# Patient Record
Sex: Female | Born: 1935 | Race: Black or African American | Hispanic: No | Smoking: Former smoker
Health system: Southern US, Community
[De-identification: ages and names within clinical notes are randomized; demographics above are authoritative.]

## PROBLEM LIST (undated history)

## (undated) DIAGNOSIS — E785 Hyperlipidemia, unspecified: Secondary | ICD-10-CM

## (undated) DIAGNOSIS — I82409 Acute embolism and thrombosis of unspecified deep veins of unspecified lower extremity: Secondary | ICD-10-CM

## (undated) DIAGNOSIS — I82890 Acute embolism and thrombosis of other specified veins: Secondary | ICD-10-CM

## (undated) DIAGNOSIS — I2510 Atherosclerotic heart disease of native coronary artery without angina pectoris: Secondary | ICD-10-CM

## (undated) DIAGNOSIS — K7689 Other specified diseases of liver: Secondary | ICD-10-CM

## (undated) DIAGNOSIS — E039 Hypothyroidism, unspecified: Secondary | ICD-10-CM

## (undated) DIAGNOSIS — I5032 Chronic diastolic (congestive) heart failure: Secondary | ICD-10-CM

## (undated) DIAGNOSIS — I8290 Acute embolism and thrombosis of unspecified vein: Secondary | ICD-10-CM

## (undated) DIAGNOSIS — I10 Essential (primary) hypertension: Secondary | ICD-10-CM

## (undated) DIAGNOSIS — I739 Peripheral vascular disease, unspecified: Secondary | ICD-10-CM

## (undated) HISTORY — DX: Atherosclerotic heart disease of native coronary artery without angina pectoris: I25.10

## (undated) HISTORY — DX: Hypothyroidism, unspecified: E03.9

## (undated) HISTORY — PX: THYROIDECTOMY: SHX17

## (undated) HISTORY — DX: Hyperlipidemia, unspecified: E78.5

## (undated) HISTORY — DX: Chronic diastolic (congestive) heart failure: I50.32

## (undated) HISTORY — DX: Acute embolism and thrombosis of unspecified vein: I82.90

## (undated) HISTORY — PX: CARDIAC CATHETERIZATION: SHX172

## (undated) HISTORY — DX: Other specified diseases of liver: K76.89

## (undated) HISTORY — PX: ANGIOPLASTY: SHX39

## (undated) HISTORY — DX: Acute embolism and thrombosis of unspecified deep veins of unspecified lower extremity: I82.409

## (undated) HISTORY — DX: Peripheral vascular disease, unspecified: I73.9

## (undated) HISTORY — DX: Acute embolism and thrombosis of other specified veins: I82.890

## (undated) HISTORY — DX: Essential (primary) hypertension: I10

---

## 1997-11-10 HISTORY — PX: CORONARY ARTERY BYPASS GRAFT: SHX141

## 2008-01-08 ENCOUNTER — Emergency Department: Payer: Self-pay | Admitting: Emergency Medicine

## 2009-07-03 ENCOUNTER — Ambulatory Visit: Payer: Self-pay | Admitting: Vascular Surgery

## 2010-07-27 ENCOUNTER — Inpatient Hospital Stay: Payer: Self-pay | Admitting: Internal Medicine

## 2011-02-27 ENCOUNTER — Emergency Department: Payer: Self-pay

## 2011-02-27 ENCOUNTER — Encounter: Payer: Self-pay | Admitting: Cardiovascular Disease

## 2011-03-03 ENCOUNTER — Encounter: Payer: Self-pay | Admitting: Cardiovascular Disease

## 2011-03-07 ENCOUNTER — Ambulatory Visit: Payer: Self-pay | Admitting: Cardiovascular Disease

## 2011-03-12 ENCOUNTER — Encounter: Payer: Self-pay | Admitting: Cardiovascular Disease

## 2011-03-12 ENCOUNTER — Ambulatory Visit (INDEPENDENT_AMBULATORY_CARE_PROVIDER_SITE_OTHER): Payer: No Typology Code available for payment source | Admitting: Cardiovascular Disease

## 2011-03-12 DIAGNOSIS — R609 Edema, unspecified: Secondary | ICD-10-CM | POA: Insufficient documentation

## 2011-03-12 DIAGNOSIS — I739 Peripheral vascular disease, unspecified: Secondary | ICD-10-CM | POA: Insufficient documentation

## 2011-03-12 DIAGNOSIS — E785 Hyperlipidemia, unspecified: Secondary | ICD-10-CM | POA: Insufficient documentation

## 2011-03-12 DIAGNOSIS — R0602 Shortness of breath: Secondary | ICD-10-CM | POA: Insufficient documentation

## 2011-03-12 DIAGNOSIS — E119 Type 2 diabetes mellitus without complications: Secondary | ICD-10-CM | POA: Insufficient documentation

## 2011-03-12 DIAGNOSIS — Z951 Presence of aortocoronary bypass graft: Secondary | ICD-10-CM

## 2011-03-12 NOTE — Assessment & Plan Note (Signed)
Edema appears to be chronic on the right, possibly from postoperative changes. She does have trace to 1+ edema on the left ankle

## 2011-03-12 NOTE — Patient Instructions (Signed)
Your physician has requested that you have an echocardiogram. Echocardiography is a painless test that uses sound waves to create images of your heart. It provides your doctor with information about the size and shape of your heart and how well your heart's chambers and valves are working. This procedure takes approximately one hour. There are no restrictions for this procedure.  Your physician has requested that you have a lower extremity arterial exercise duplex. During this test, exercise and ultrasound are used to evaluate arterial blood flow in the legs. Allow one hour for this exam. There are no restrictions or special instructions.  Your physician recommends that you schedule a follow-up appointment in: 3 months

## 2011-03-12 NOTE — Assessment & Plan Note (Signed)
We'll try to obtain her most recent lipid panel prior records. Goal LDL is less than 70.

## 2011-03-12 NOTE — Progress Notes (Signed)
Patient ID: [name] [name], female    DOB: 01-10-1925, 77 y.o.   MRN: [id number]  HPI Comments: [name] [name] is a very pleasant 77 year old woman with history of coronary artery disease, bypass surgery in 1999 in [place], long history of smoking for at least 35 years who stopped over a decade ago, severe peripheral vascular disease with stenting placed to her bilateral iliac vessels in August 2010, hyperlipidemia, diabetes, hypertension with episode of gout in September 2011, recent evaluation in the emergency room for left hand numbness who was referred here for further evaluation.  She reports that the emergency room told her that her hand numbness was thought secondary to a heart attack. She does have chronic shortness of breath though her granddaughter reports this has been getting worse recently. She is on a diuretic. She does have chronic lower extremity edema which is worse on the right than the left. Pain donor site was on the right as well as radial graft on the right forearm. She does have occasional episodes of chest pain though her biggest complaint is shortness of breath and swelling. She is unable to wear TED hose.   She does have some weakness in her legs and does not walk very far. Her breathing stops her from walking any significant distance. She reports that her sugars have been well-controlled.  EKG shows normal sinus rhythm with rate 78 beats per minute with no significant ST or T wave changes     Review of Systems  HENT: Negative.   Eyes: Negative.   Respiratory: Positive for shortness of breath.   Cardiovascular: Positive for chest pain and leg swelling.  Gastrointestinal: Negative.   Musculoskeletal: Positive for gait problem.  Skin: Negative.   Neurological: Positive for weakness.  Hematological: Negative.   Psychiatric/Behavioral: Negative.   All other systems reviewed and are negative.   BP 130/88  Pulse 78  Ht 5\' 4"  (1.626 m)  Wt 200 lb (90.719 kg)  BMI  34.33 kg/m2   Physical Exam  Nursing note and vitals reviewed. Constitutional: She is oriented to person, place, and time. She appears well-developed and well-nourished.       Obese.  HENT:  Head: Normocephalic.  Nose: Nose normal.  Mouth/Throat: Oropharynx is clear and moist.  Eyes: Conjunctivae are normal. Pupils are equal, round, and reactive to light.  Neck: Normal range of motion. Neck supple. No JVD present.  Cardiovascular: Normal rate, regular rhythm, S1 normal, S2 normal and intact distal pulses.  Exam reveals no gallop and no friction rub.   Murmur heard.  Crescendo systolic murmur is present with a grade of 2/6       1+ pitting edema with signs of chronic venous stasis on the right to below the knee, trace edema around the left ankle.  Pulmonary/Chest: Effort normal and breath sounds normal. No respiratory distress. She has no wheezes. She has no rales. She exhibits no tenderness.  Abdominal: Soft. Bowel sounds are normal. She exhibits no distension. There is no tenderness.  Musculoskeletal: Normal range of motion. She exhibits edema. She exhibits no tenderness.  Lymphadenopathy:    She has no cervical adenopathy.  Neurological: She is alert and oriented to person, place, and time. Coordination normal.  Skin: Skin is warm and dry. No rash noted. No erythema.  Psychiatric: She has a normal mood and affect. Her behavior is normal. Judgment and thought content normal.         Assessment and Plan

## 2011-03-12 NOTE — Assessment & Plan Note (Signed)
Currently with no symptoms of angina. No further workup at this time. Continue current medication regimen. 

## 2011-03-12 NOTE — Assessment & Plan Note (Signed)
Her biggest complaint is shortness of breath. Uncertain if this is from fluid overload, pulmonary hypertension, worsening ischemia or deconditioned state. We have ordered an echocardiogram to help distinguish what her etiology is.

## 2011-03-12 NOTE — Assessment & Plan Note (Signed)
She does have some leg weakness, leg swelling with poor pulses in the lower extremities. We have ordered routine ABIs for the lower extremities given her history of stenting and severe peripheral vascular disease in 2010.

## 2011-03-12 NOTE — Assessment & Plan Note (Signed)
She reports adequate diabetes numbers. We'll try to obtain the lab work for our review. Creatinine from the hospital is 1.6. This needed to be monitored closely.

## 2011-03-25 ENCOUNTER — Ambulatory Visit: Payer: Self-pay | Admitting: Internal Medicine

## 2011-03-27 ENCOUNTER — Other Ambulatory Visit (INDEPENDENT_AMBULATORY_CARE_PROVIDER_SITE_OTHER): Payer: No Typology Code available for payment source | Admitting: *Deleted

## 2011-03-27 ENCOUNTER — Other Ambulatory Visit: Payer: Self-pay | Admitting: *Deleted

## 2011-03-27 ENCOUNTER — Encounter (INDEPENDENT_AMBULATORY_CARE_PROVIDER_SITE_OTHER): Payer: No Typology Code available for payment source | Admitting: *Deleted

## 2011-03-27 ENCOUNTER — Other Ambulatory Visit: Payer: Self-pay | Admitting: Cardiovascular Disease

## 2011-03-27 DIAGNOSIS — I739 Peripheral vascular disease, unspecified: Secondary | ICD-10-CM

## 2011-03-27 DIAGNOSIS — I2510 Atherosclerotic heart disease of native coronary artery without angina pectoris: Secondary | ICD-10-CM

## 2011-03-27 DIAGNOSIS — E785 Hyperlipidemia, unspecified: Secondary | ICD-10-CM

## 2011-03-27 DIAGNOSIS — Z951 Presence of aortocoronary bypass graft: Secondary | ICD-10-CM

## 2011-03-27 DIAGNOSIS — Z79899 Other long term (current) drug therapy: Secondary | ICD-10-CM

## 2011-03-27 DIAGNOSIS — R0602 Shortness of breath: Secondary | ICD-10-CM

## 2011-03-27 DIAGNOSIS — R609 Edema, unspecified: Secondary | ICD-10-CM

## 2011-03-28 LAB — HEPATIC FUNCTION PANEL
ALT: 52 U/L — ABNORMAL HIGH (ref 0–35)
AST: 46 U/L — ABNORMAL HIGH (ref 0–37)
Bilirubin, Direct: 0.1 mg/dL (ref 0.0–0.3)
Indirect Bilirubin: 0.6 mg/dL (ref 0.0–0.9)
Total Bilirubin: 0.7 mg/dL (ref 0.3–1.2)

## 2011-03-28 LAB — LIPID PANEL
Cholesterol: 165 mg/dL (ref 0–200)
VLDL: 40 mg/dL (ref 0–40)

## 2011-03-31 ENCOUNTER — Encounter: Payer: Self-pay | Admitting: Cardiovascular Disease

## 2011-04-08 ENCOUNTER — Encounter: Payer: Self-pay | Admitting: Cardiovascular Disease

## 2011-04-10 ENCOUNTER — Telehealth: Payer: Self-pay | Admitting: *Deleted

## 2011-04-10 ENCOUNTER — Other Ambulatory Visit: Payer: No Typology Code available for payment source | Admitting: *Deleted

## 2011-04-10 NOTE — Telephone Encounter (Signed)
Call pt to notify of results of echo and PVS  The echocardiograms shows: - Left ventricle: The cavity size was normal.  Systolic function was normal. The estimated ejection fraction was in the range of 55% to 65%. Wall motion was normal; there were no regional wall motion abnormalities. Evidence of diastolic dysfunction.  - Left atrium: The atrium was mildly dilated. - Right ventricle: Systolic function was normal. - Tricuspid valve: Mild to moderate regurgitation. - Pulmonary arteries: Systolic pressure was elevated consistent with mild to moderate pulmonary HTN. RVSP estimated at least 45 mm Hg.   The ultrasound of the legs suggest that the stents are getting blocked. We would like for you to meet with one of the interventional doctors to see if the stents need to be reopened. Please call the office so we can help set this up for you.

## 2011-04-14 NOTE — Telephone Encounter (Signed)
Spoke to pt, notified of msg below, have referred her to Dr. Wyn Quaker and Reino Kent, spoke to receptionist and was told they had an opening today at 3pm. Spoke with pt's daughter Loel Lofty ( who takes pt to appts) and she is going to check with her work to see if she can make appt today, if not, will reschedule. Notes faxed to Dr. Wyn Quaker and Reino Kent.

## 2011-04-14 NOTE — Telephone Encounter (Signed)
Pt's daughter confirmed appt, notified pt.

## 2011-04-16 ENCOUNTER — Telehealth: Payer: Self-pay

## 2011-04-16 MED ORDER — EZETIMIBE 10 MG PO TABS: 10.0000 mg | ORAL_TABLET | Freq: Every day | ORAL | Status: DC

## 2011-04-16 NOTE — Telephone Encounter (Signed)
The daughter states patient would like to add the zetia 10 mg to the lovastatin.  Rx sent to [facility] on [address].

## 2011-04-16 NOTE — Telephone Encounter (Signed)
Spoke with the daughter regarding the cholesterol letter about the change in medication or just add zetia to the lovastatin.  Waiting to hear what the patient would like to do? Send to [facility] on [address].

## 2011-04-18 ENCOUNTER — Telehealth: Payer: Self-pay | Admitting: Cardiovascular Disease

## 2011-04-18 NOTE — Telephone Encounter (Signed)
Dr. Mariah Milling, can we send clearance for colonoscopy? Pt seen in office 03/12/2011 for f/u from [facility]. Please advise.

## 2011-04-18 NOTE — Telephone Encounter (Signed)
Please advise 

## 2011-04-18 NOTE — Telephone Encounter (Signed)
Needs a written clearance for a colonoscopy for Dr Larose Kells.  Fax #[phone]

## 2011-04-21 ENCOUNTER — Encounter: Payer: Self-pay | Admitting: Cardiovascular Disease

## 2011-04-23 NOTE — Telephone Encounter (Signed)
OK to have colonoscopy. Make sure they do not give IV fluids

## 2011-04-24 ENCOUNTER — Encounter: Payer: Self-pay | Admitting: *Deleted

## 2011-04-24 NOTE — Telephone Encounter (Signed)
Note sent to Dr. Niel Hummer for clearance.

## 2011-05-16 ENCOUNTER — Telehealth: Payer: Self-pay | Admitting: *Deleted

## 2011-05-16 NOTE — Telephone Encounter (Signed)
Pt calling regarding side effects of Zetia. Pt was started on Zetia 10mg  05/10/11 and states she has had GI upset since. She has not started any other new meds. Pt will hold for 1 week and call me back to let me know if her symptoms improved. Pt is still taking Lovastatin.

## 2011-05-19 NOTE — Telephone Encounter (Signed)
After Gi better, try 1/2 dose zetia first for a few weeks

## 2011-05-20 NOTE — Telephone Encounter (Signed)
Pt notified, she states GI has improved, she will try early next week at 1/2 tablet of Zetia and will call to notify how she is tolerating.

## 2011-06-12 ENCOUNTER — Ambulatory Visit: Payer: No Typology Code available for payment source | Admitting: Cardiovascular Disease

## 2011-07-15 ENCOUNTER — Ambulatory Visit: Payer: No Typology Code available for payment source | Admitting: Cardiovascular Disease

## 2011-11-26 ENCOUNTER — Telehealth: Payer: Self-pay

## 2011-11-26 MED ORDER — FUROSEMIDE 40 MG PO TABS: 40.0000 mg | ORAL_TABLET | Freq: Every day | ORAL | Status: DC

## 2011-11-26 MED ORDER — DILTIAZEM HCL 120 MG PO TABS: 120.0000 mg | ORAL_TABLET | Freq: Every day | ORAL | Status: DC

## 2011-11-26 MED ORDER — POTASSIUM CHLORIDE CRYS ER 10 MEQ PO TBCR: 10.0000 meq | EXTENDED_RELEASE_TABLET | Freq: Every day | ORAL | Status: DC

## 2011-11-26 NOTE — Telephone Encounter (Signed)
Refill sent for furosemide 40 mg and diltiazem 120 mg. The patient has a follow up scheduled for Feb. 15, 2013 with Dr. Mariah Milling.

## 2011-11-26 NOTE — Telephone Encounter (Signed)
Refill sent for klor con 10 meq

## 2011-11-30 ENCOUNTER — Inpatient Hospital Stay: Payer: Self-pay | Admitting: Internal Medicine

## 2011-11-30 DIAGNOSIS — I959 Hypotension, unspecified: Secondary | ICD-10-CM

## 2011-11-30 DIAGNOSIS — I498 Other specified cardiac arrhythmias: Secondary | ICD-10-CM

## 2011-11-30 LAB — CK TOTAL AND CKMB (NOT AT ARMC)
CK, Total: 281 U/L — ABNORMAL HIGH (ref 21–215)
CK, Total: 297 U/L — ABNORMAL HIGH (ref 21–215)
CK-MB: 1.3 ng/mL (ref 0.5–3.6)
CK-MB: 1.5 ng/mL (ref 0.5–3.6)

## 2011-11-30 LAB — CBC
MCHC: 33.5 g/dL (ref 32.0–36.0)
MCV: 97 fL (ref 80–100)
Platelet: 203 10*3/uL (ref 150–440)
RBC: 3.88 10*6/uL (ref 3.80–5.20)
RDW: 13.7 % (ref 11.5–14.5)
WBC: 11.1 10*3/uL — ABNORMAL HIGH (ref 3.6–11.0)

## 2011-11-30 LAB — COMPREHENSIVE METABOLIC PANEL
Albumin: 2.9 g/dL — ABNORMAL LOW (ref 3.4–5.0)
Anion Gap: 12 (ref 7–16)
Calcium, Total: 8.3 mg/dL — ABNORMAL LOW (ref 8.5–10.1)
Co2: 25 mmol/L (ref 21–32)
Creatinine: 1.57 mg/dL — ABNORMAL HIGH (ref 0.60–1.30)
Glucose: 154 mg/dL — ABNORMAL HIGH (ref 65–99)
SGPT (ALT): 144 U/L — ABNORMAL HIGH
Sodium: 147 mmol/L — ABNORMAL HIGH (ref 136–145)
Total Protein: 6 g/dL — ABNORMAL LOW (ref 6.4–8.2)

## 2011-11-30 LAB — TSH: Thyroid Stimulating Horm: 6.74 u[IU]/mL — ABNORMAL HIGH

## 2011-12-01 DIAGNOSIS — I369 Nonrheumatic tricuspid valve disorder, unspecified: Secondary | ICD-10-CM

## 2011-12-01 LAB — LIPID PANEL
Cholesterol: 81 mg/dL (ref 0–200)
HDL Cholesterol: 38 mg/dL — ABNORMAL LOW (ref 40–60)

## 2011-12-01 LAB — COMPREHENSIVE METABOLIC PANEL
Anion Gap: 10 (ref 7–16)
BUN: 21 mg/dL — ABNORMAL HIGH (ref 7–18)
Bilirubin,Total: 0.7 mg/dL (ref 0.2–1.0)
Chloride: 108 mmol/L — ABNORMAL HIGH (ref 98–107)
Co2: 29 mmol/L (ref 21–32)
Creatinine: 1.33 mg/dL — ABNORMAL HIGH (ref 0.60–1.30)
EGFR (African American): 49 — ABNORMAL LOW
EGFR (Non-African Amer.): 40 — ABNORMAL LOW
Osmolality: 294 (ref 275–301)
Potassium: 4.1 mmol/L (ref 3.5–5.1)
SGPT (ALT): 120 U/L — ABNORMAL HIGH
Sodium: 147 mmol/L — ABNORMAL HIGH (ref 136–145)
Total Protein: 6.2 g/dL — ABNORMAL LOW (ref 6.4–8.2)

## 2011-12-01 LAB — CK TOTAL AND CKMB (NOT AT ARMC)
CK, Total: 309 U/L — ABNORMAL HIGH (ref 21–215)
CK-MB: 1.2 ng/mL (ref 0.5–3.6)

## 2011-12-01 LAB — MAGNESIUM: Magnesium: 1.7 mg/dL — ABNORMAL LOW

## 2011-12-01 LAB — CBC WITH DIFFERENTIAL/PLATELET
Basophil #: 0 10*3/uL (ref 0.0–0.1)
Basophil %: 0.4 %
Eosinophil #: 0.2 10*3/uL (ref 0.0–0.7)
Eosinophil %: 1.8 %
HGB: 11.9 g/dL — ABNORMAL LOW (ref 12.0–16.0)
Lymphocyte #: 2.6 10*3/uL (ref 1.0–3.6)
Lymphocyte %: 29.3 %
MCH: 32.5 pg (ref 26.0–34.0)
MCHC: 33.7 g/dL (ref 32.0–36.0)
Monocyte %: 8.2 %
Neutrophil #: 5.3 10*3/uL (ref 1.4–6.5)
Neutrophil %: 60.3 %
Platelet: 173 10*3/uL (ref 150–440)
RDW: 13.4 % (ref 11.5–14.5)

## 2011-12-01 LAB — HEMOGLOBIN A1C: Hemoglobin A1C: 7.4 % — ABNORMAL HIGH (ref 4.2–6.3)

## 2011-12-11 ENCOUNTER — Encounter: Payer: Self-pay | Admitting: Cardiovascular Disease

## 2011-12-11 ENCOUNTER — Ambulatory Visit (INDEPENDENT_AMBULATORY_CARE_PROVIDER_SITE_OTHER): Payer: Medicare Other | Admitting: Cardiovascular Disease

## 2011-12-11 DIAGNOSIS — Z951 Presence of aortocoronary bypass graft: Secondary | ICD-10-CM

## 2011-12-11 DIAGNOSIS — R0602 Shortness of breath: Secondary | ICD-10-CM

## 2011-12-11 DIAGNOSIS — R609 Edema, unspecified: Secondary | ICD-10-CM

## 2011-12-11 DIAGNOSIS — R000 Tachycardia, unspecified: Secondary | ICD-10-CM

## 2011-12-11 DIAGNOSIS — I739 Peripheral vascular disease, unspecified: Secondary | ICD-10-CM

## 2011-12-11 DIAGNOSIS — E785 Hyperlipidemia, unspecified: Secondary | ICD-10-CM

## 2011-12-11 DIAGNOSIS — E119 Type 2 diabetes mellitus without complications: Secondary | ICD-10-CM

## 2011-12-11 MED ORDER — ATENOLOL 25 MG PO TABS: 25.0000 mg | ORAL_TABLET | Freq: Every day | ORAL | Status: DC

## 2011-12-11 NOTE — Assessment & Plan Note (Signed)
She needs to restart lovastatin and zetia as she was on previously

## 2011-12-11 NOTE — Assessment & Plan Note (Signed)
We will schedule her for repeat LE arterial U/S . Last study in May 2012 suggested severe iliac arterial disease, right SFA disease >50%.

## 2011-12-11 NOTE — Assessment & Plan Note (Signed)
Edema secondary to venous insufficiency, pulmonary hypertension also seen on echocardiogram. We'll continue gentle diuresis with close monitoring of her renal function. We have suggested she take Lasix as needed for shortness of breath or worsening edema. She has been taking Lasix daily

## 2011-12-11 NOTE — Assessment & Plan Note (Signed)
We have encouraged continued exercise, careful diet management in an effort to lose weight. 

## 2011-12-11 NOTE — Progress Notes (Signed)
Patient ID: [name] [name], female    DOB: January 05, 1925, 78 y.o.   MRN: [id number]  HPI Comments: [name] [name] is a very pleasant 78 year old woman with history of coronary artery disease, bypass surgery in 1999 in [place], long history of smoking for at least 35 years who stopped over a decade ago, severe peripheral vascular disease with stenting placed to her bilateral iliac vessels in August 2010, hyperlipidemia, diabetes, hypertension with episode of gout in September 2011, recent Admission to [facility] for bradycardia and hypotension. She was found to have heart rate of 46, junctional rhythm, systolic blood pressure of 80. Her Cardizem 120 mg daily and atenolol were discontinued, she was started on amlodipine. She had renal dysfunction while in the hospital and Lasix was used only p.r.n..  Today she reports that she has mild shortness of breath. She has been taking Lasix daily though she is not sure why. She is not taking the Lasix for edema. She does have chronic edema, worse on the right secondary to bypass surgery vein harvesting. She denies any fatigue or dizziness that she felt previously with her low heart rate. She has been taking potassium daily.  She does have some weakness in her legs and does not walk very far. Her breathing stops her from walking any significant distance. She reports that her sugars have been high. She is recovering from bilateral hand surgery for trigger finger.  EKG shows Sinus tachycardia with rate 113 beats per minute with nonspecific ST abnormality   Outpatient Encounter Prescriptions as of 12/11/2011  Medication Sig Dispense Refill  . allopurinol (ZYLOPRIM) 100 MG tablet Take 100 mg by mouth daily.      Marland Kitchen amLODipine (NORVASC) 5 MG tablet Take 5 mg by mouth daily.      Marland Kitchen aspirin 325 MG EC tablet Take 325 mg by mouth daily.        . colchicine 0.6 MG tablet Take 0.6 mg by mouth daily.        . furosemide (LASIX) 40 MG tablet Take 40 mg by mouth daily as needed.  SHE IS TAKI                NG DAILY      . GINKGO BILOBA COMPLEX PO Take by mouth.        . insulin NPH-insulin regular (NOVOLIN 70/30) (70-30) 100 UNIT/ML injection Inject 70 Units into the skin. 30 units in the pm        . levothyroxine (SYNTHROID, LEVOTHROID) 125 MCG tablet Take 125 mcg by mouth daily.        Marland Kitchen losartan (COZAAR) 50 MG tablet Take 50 mg by mouth daily.      . Melatonin 1 MG TABS Take 1 mg by mouth at bedtime.      . Omega-3 Fatty Acids (FISH OIL) 1000 MG CAPS Take by mouth.        . potassium chloride (K-DUR,KLOR-CON) 10 MEQ tablet Take 1 tablet (10 mEq total) by mouth daily.  90 tablet  3  . atenolol (TENORMIN) 25 MG tablet Take 1 tablet (25 mg total) by mouth daily.  HELD  90 tablet  3     Review of Systems  HENT: Negative.   Eyes: Negative.   Respiratory: Positive for shortness of breath.   Cardiovascular: Positive for leg swelling.  Gastrointestinal: Negative.   Musculoskeletal: Positive for gait problem.  Skin: Negative.   Neurological: Positive for weakness.  Hematological: Negative.   Psychiatric/Behavioral: Negative.   All other  systems reviewed and are negative.   BP 156/82  Pulse 113  Ht 5\' 4"  (1.626 m)  Wt 189 lb (85.73 kg)  BMI 32.44 kg/m2 Repeat blood pressure 135/80   Physical Exam  Nursing note and vitals reviewed. Constitutional: She is oriented to person, place, and time. She appears well-developed and well-nourished.       Obese.  HENT:  Head: Normocephalic.  Nose: Nose normal.  Mouth/Throat: Oropharynx is clear and moist.  Eyes: Conjunctivae are normal. Pupils are equal, round, and reactive to light.  Neck: Normal range of motion. Neck supple. No JVD present.  Cardiovascular: Normal rate, regular rhythm, S1 normal, S2 normal and intact distal pulses.  Exam reveals no gallop and no friction rub.   Murmur heard.  Crescendo systolic murmur is present with a grade of 2/6       1+ pitting edema with signs of chronic venous stasis on the  right to below the knee, trace edema around the left ankle.  Pulmonary/Chest: Effort normal and breath sounds normal. No respiratory distress. She has no wheezes. She has no rales. She exhibits no tenderness.  Abdominal: Soft. Bowel sounds are normal. She exhibits no distension. There is no tenderness.  Musculoskeletal: Normal range of motion. She exhibits edema. She exhibits no tenderness.  Lymphadenopathy:    She has no cervical adenopathy.  Neurological: She is alert and oriented to person, place, and time. Coordination normal.  Skin: Skin is warm and dry. No rash noted. No erythema.  Psychiatric: She has a normal mood and affect. Her behavior is normal. Judgment and thought content normal.         Assessment and Plan

## 2011-12-11 NOTE — Assessment & Plan Note (Signed)
Currently with no symptoms of angina. No further workup at this time. Continue current medication regimen. 

## 2011-12-11 NOTE — Assessment & Plan Note (Signed)
Sinus tachycardia on today's visit. We'll check a basic metabolic panel today to make sure that she is not dehydrated. We will restart atenolol 25 mg daily. She appears comfortable. Her heart rate could be causing her mild shortness of breath with exertion. We'll see her back in close followup to follow her heart rate and blood pressure. Her daughter will monitor her vitals at home for [place].

## 2011-12-11 NOTE — Patient Instructions (Addendum)
You are doing well. Please restart atenolol once a day Please take lasix every other day Only take potassium when you take the lasix We will check your kidney function today.  Please call us if you have new issues that need to be addressed before your next appt.  Your physician wants you to follow-up in: 1 months.  You will receive a reminder letter in the mail two months in advance. If you don't receive a letter, please call our office to schedule the follow-up appointment.

## 2011-12-12 LAB — BASIC METABOLIC PANEL
BUN: 20 mg/dL (ref 8–27)
GFR calc Af Amer: 41 mL/min/{1.73_m2} — ABNORMAL LOW (ref >59)
GFR calc non Af Amer: 35 mL/min/{1.73_m2} — ABNORMAL LOW (ref >59)
Glucose: 334 mg/dL — ABNORMAL HIGH (ref 65–99)

## 2011-12-26 ENCOUNTER — Ambulatory Visit (INDEPENDENT_AMBULATORY_CARE_PROVIDER_SITE_OTHER): Payer: Medicare Other | Admitting: Cardiovascular Disease

## 2011-12-26 ENCOUNTER — Encounter: Payer: Self-pay | Admitting: Cardiovascular Disease

## 2011-12-26 DIAGNOSIS — R609 Edema, unspecified: Secondary | ICD-10-CM

## 2011-12-26 DIAGNOSIS — E785 Hyperlipidemia, unspecified: Secondary | ICD-10-CM

## 2011-12-26 DIAGNOSIS — Z951 Presence of aortocoronary bypass graft: Secondary | ICD-10-CM

## 2011-12-26 DIAGNOSIS — R0602 Shortness of breath: Secondary | ICD-10-CM

## 2011-12-26 DIAGNOSIS — R000 Tachycardia, unspecified: Secondary | ICD-10-CM

## 2011-12-26 DIAGNOSIS — E119 Type 2 diabetes mellitus without complications: Secondary | ICD-10-CM

## 2011-12-26 NOTE — Assessment & Plan Note (Signed)
We have encouraged continued exercise, careful diet management in an effort to lose weight. 

## 2011-12-26 NOTE — Assessment & Plan Note (Signed)
We have suggested she stay on Lipitor 20 mg daily. Elevated LFTs are likely secondary to pulmonary congestion, Possible nonalcoholic steatohepatitis (NASH).

## 2011-12-26 NOTE — Assessment & Plan Note (Signed)
Currently with no symptoms of angina. No further workup at this time. Continue current medication regimen. 

## 2011-12-26 NOTE — Assessment & Plan Note (Signed)
Edema is likely multifactorial with pulmonary hypertension and fluid overload as well as venous insufficiency ( most notable on the right lower extremity). We have encouraged Lasix every other day given her underlying renal dysfunction, possibly daily for worsening edema or shortness of breath. She should take this with potassium.   We have encouraged Ace wrapping of her legs, TED hose, leg elevation.

## 2011-12-26 NOTE — Progress Notes (Signed)
Patient ID: [name] [name], female    DOB: 1925-10-18, 78 y.o.   MRN: [id number]  HPI Comments: [name] [name] is a very pleasant 78 year old female with past medical history of coronary artery disease, bypass surgery, hypertension, diabetes, Pulmonary hypertension (moderate), hyperlipidemia, peripheral vascular disease who presented to  [facility] on November 30, 2011 with hypotension and bradycardia. She has chronic lower extremity edema. She was found to be in a junctional rhythm with heart rate of 46 beats per minute, systolic blood pressure of 80. Her Cardizem was held with improvement of her heart rate and blood pressure.  Baseline creatinine 1.5, elevated liver enzymes.  She presents today and reports that she is doing well. He continues to have significant lower extremity edema on the right, mild to moderate on the left. It is relatively stable. She is not using compression hose as recommended. She does take Lasix 40 mg every other day with potassium. She denies any lightheadedness or dizziness. She walks with a cane. Overall she is in good spirits.  Echocardiogram December 01, 2011 shows normal systolic function, ejection fraction greater than 55%, mild LVH, mild TR, moderate right ventricular systolic pressures.  EKG shows normal sinus rhythm with rate 80 beats per minute with T-wave abnormality in lead V6, one, aVL   Outpatient Encounter Prescriptions as of 12/26/2011  Medication Sig Dispense Refill  . allopurinol (ZYLOPRIM) 100 MG tablet Take 100 mg by mouth daily.      Marland Kitchen aspirin 325 MG EC tablet Take 325 mg by mouth daily.        Marland Kitchen atenolol (TENORMIN) 25 MG tablet Take 1 tablet (25 mg total) by mouth daily.  90 tablet  3  . atorvastatin (LIPITOR) 20 MG tablet Take 20 mg by mouth every evening.      . colchicine 0.6 MG tablet Take 0.6 mg by mouth daily.        . furosemide (LASIX) 40 MG tablet Take 40 mg by mouth every other day.       Marland Kitchen GINKGO BILOBA COMPLEX PO Take by mouth.        . insulin  NPH-insulin regular (NOVOLIN 70/30) (70-30) 100 UNIT/ML injection Inject 70 Units into the skin. 30 units in the pm        . levothyroxine (SYNTHROID, LEVOTHROID) 125 MCG tablet Take 125 mcg by mouth daily.        Marland Kitchen losartan (COZAAR) 50 MG tablet Take 50 mg by mouth daily.      . Omega-3 Fatty Acids (FISH OIL) 1000 MG CAPS Take by mouth.        . potassium chloride (K-DUR,KLOR-CON) 10 MEQ tablet Take 10 mEq by mouth every other day. Take with lasix        Review of Systems  Constitutional: Negative.   HENT: Negative.   Eyes: Negative.   Respiratory: Negative.   Cardiovascular: Positive for leg swelling.  Gastrointestinal: Negative.   Musculoskeletal: Positive for back pain, arthralgias and gait problem.  Skin: Negative.   Neurological: Negative.   Hematological: Negative.   Psychiatric/Behavioral: Negative.   All other systems reviewed and are negative.    BP 132/74  Pulse 80  Ht 5\' 4"  (1.626 m)  Wt 193 lb (87.544 kg)  BMI 33.13 kg/m2  Physical Exam  Nursing note and vitals reviewed. Constitutional: She is oriented to person, place, and time. She appears well-developed and well-nourished.  HENT:  Head: Normocephalic.  Nose: Nose normal.  Mouth/Throat: Oropharynx is clear and moist.  Eyes: Conjunctivae are  normal. Pupils are equal, round, and reactive to light.  Neck: Normal range of motion. Neck supple. No JVD present.  Cardiovascular: Normal rate, regular rhythm, S1 normal, S2 normal, normal heart sounds and intact distal pulses.  Exam reveals no gallop and no friction rub.   No murmur heard.      Pitting woody edema on the right to below the knee, trace to 1+ pitting on the left LE.  Pulmonary/Chest: Effort normal and breath sounds normal. No respiratory distress. She has no wheezes. She has no rales. She exhibits no tenderness.  Abdominal: Soft. Bowel sounds are normal. She exhibits no distension. There is no tenderness.  Musculoskeletal: Normal range of motion. She  exhibits no edema and no tenderness.  Lymphadenopathy:    She has no cervical adenopathy.  Neurological: She is alert and oriented to person, place, and time. Coordination normal.  Skin: Skin is warm and dry. No rash noted. No erythema.  Psychiatric: She has a normal mood and affect. Her behavior is normal. Judgment and thought content normal.         Assessment and Plan

## 2011-12-26 NOTE — Assessment & Plan Note (Signed)
Mild shortness of breath likely secondary to underlying pulmonary hypertension. We'll continue gentle diuresis. We have suggested she restrict her fluid intake.

## 2011-12-26 NOTE — Patient Instructions (Signed)
You are doing well. No medication changes were made.  Please use ACE wrap or compression hose for legs Please take an extra lasix with potassium as needed for swelling Restrict your salt intake and liquid intake  Please call us if you have new issues that need to be addressed before your next appt.  Your physician wants you to follow-up in: 6 months.  You will receive a reminder letter in the mail two months in advance. If you don't receive a letter, please call our office to schedule the follow-up appointment.

## 2012-01-08 ENCOUNTER — Ambulatory Visit: Payer: Medicare Other | Admitting: Cardiovascular Disease

## 2012-01-13 ENCOUNTER — Emergency Department: Payer: Self-pay | Admitting: Emergency Medicine

## 2012-01-15 ENCOUNTER — Encounter: Payer: Self-pay | Admitting: Nurse Practitioner

## 2012-01-15 ENCOUNTER — Encounter: Payer: Self-pay | Admitting: Cardiothoracic Surgery

## 2012-01-27 ENCOUNTER — Ambulatory Visit: Payer: Self-pay | Admitting: Vascular Surgery

## 2012-01-27 LAB — BASIC METABOLIC PANEL
Calcium, Total: 8.9 mg/dL (ref 8.5–10.1)
Chloride: 108 mmol/L — ABNORMAL HIGH (ref 98–107)
Co2: 28 mmol/L (ref 21–32)
Creatinine: 1.12 mg/dL (ref 0.60–1.30)
EGFR (African American): 60 — ABNORMAL LOW
Potassium: 4 mmol/L (ref 3.5–5.1)
Sodium: 146 mmol/L — ABNORMAL HIGH (ref 136–145)

## 2012-02-06 ENCOUNTER — Encounter: Payer: Self-pay | Admitting: Cardiovascular Disease

## 2012-04-07 ENCOUNTER — Emergency Department: Payer: Self-pay | Admitting: *Deleted

## 2012-07-21 ENCOUNTER — Ambulatory Visit: Payer: Self-pay

## 2012-07-23 ENCOUNTER — Ambulatory Visit (INDEPENDENT_AMBULATORY_CARE_PROVIDER_SITE_OTHER): Payer: Medicare Other | Admitting: Cardiovascular Disease

## 2012-07-23 ENCOUNTER — Encounter: Payer: Self-pay | Admitting: Cardiovascular Disease

## 2012-07-23 VITALS — BP 118/68 | HR 70 | Ht 64.0 in | Wt 210.0 lb

## 2012-07-23 DIAGNOSIS — R609 Edema, unspecified: Secondary | ICD-10-CM

## 2012-07-23 DIAGNOSIS — E785 Hyperlipidemia, unspecified: Secondary | ICD-10-CM

## 2012-07-23 DIAGNOSIS — R0602 Shortness of breath: Secondary | ICD-10-CM

## 2012-07-23 DIAGNOSIS — R000 Tachycardia, unspecified: Secondary | ICD-10-CM

## 2012-07-23 DIAGNOSIS — E119 Type 2 diabetes mellitus without complications: Secondary | ICD-10-CM

## 2012-07-23 MED ORDER — TORSEMIDE 20 MG PO TABS: 40.0000 mg | ORAL_TABLET | Freq: Every day | ORAL | Status: DC

## 2012-07-23 MED ORDER — SIMVASTATIN 20 MG PO TABS: 20.0000 mg | ORAL_TABLET | Freq: Every day | ORAL | Status: DC

## 2012-07-23 NOTE — Assessment & Plan Note (Signed)
Pulmonary hypertension by history on previous echocardiogram. No significant improvement in her edema on Lasix. We have suggested she do a trial on torsemide 40 mg daily with potassium until she sees vascular in [place]. If she has no significant improvement in her edema, she could go back to Lasix 40 mg daily. We have encouraged her to use her compression apparatus.

## 2012-07-23 NOTE — Assessment & Plan Note (Signed)
Mild shortness of breath with exertion. Continue diuretics for pulmonary hypertension.

## 2012-07-23 NOTE — Assessment & Plan Note (Signed)
She does report that sugars have been running high. She is on insulin followed by Dr. Tedd Sias

## 2012-07-23 NOTE — Assessment & Plan Note (Signed)
We will restart simvastatin 20 mg daily for hyperlipidemia. She stopped Lipitor for insomnia.

## 2012-07-23 NOTE — Patient Instructions (Addendum)
Please hold lasix Start torsemide 40 mg daily in the morning the potassium (take two of the torsemide 20 mg pills) Monitor your weight  Call the office in 2 weeks to let us know what your weight is doing  Start simvastatin one a day in the evening for cholesterol  Please call us if you have new issues that need to be addressed before your next appt.  Your physician wants you to follow-up in: 3 months.  You will receive a reminder letter in the mail two months in advance. If you don't receive a letter, please call our office to schedule the follow-up appointment.

## 2012-07-23 NOTE — Progress Notes (Signed)
Patient ID: [name] [name], female    DOB: 1925-08-03, 78 y.o.   MRN: [id number]  HPI Comments: [name] [name] is a very pleasant 78 year old female with past medical history of coronary artery disease, bypass surgery, hypertension, diabetes, Pulmonary hypertension (moderate), hyperlipidemia, peripheral vascular disease who presented to [facility] on November 30, 2011 with hypotension and bradycardia. She has chronic lower extremity edema. She was found to be in a junctional rhythm with heart rate of 46 beats per minute, systolic blood pressure of 80. Her Cardizem was held with improvement of her heart rate and blood pressure.  Baseline creatinine 1.5, elevated liver enzymes.  She continues to have bilateral lower extremity edema. She has been seen by vascular in [facility] for lymphedema. She is tolerating Lasix 40 mg daily with potassium. She walks with a cane. She denies any lightheadedness or dizziness. Very mild shortness of breath. She is not using compression hose as recommended.   She stopped her statin on her own as it was disrupting her sleep. This was Lipitor 20 mg daily. She reports that her sugars have been elevated and she was started on insulin.  Echocardiogram December 01, 2011 shows normal systolic function, ejection fraction greater than 55%, mild LVH, mild TR, moderate right ventricular systolic pressures.  EKG shows normal sinus rhythm with rate 70 beats per minute with T-wave abnormality in lead V6, one, aVL   Outpatient Encounter Prescriptions as of 07/23/2012  Medication Sig Dispense Refill  . allopurinol (ZYLOPRIM) 100 MG tablet Take 100 mg by mouth daily.      Marland Kitchen aspirin 81 MG tablet Take 81 mg by mouth daily.      Marland Kitchen atenolol (TENORMIN) 25 MG tablet Take 1 tablet (25 mg total) by mouth daily.  90 tablet  3  . atorvastatin (LIPITOR) 20 MG tablet Take 20 mg by mouth every evening.      . colchicine 0.6 MG tablet Take 0.6 mg by mouth daily.        . furosemide (LASIX) 40 MG tablet  Take 40 mg by mouth daily.       Marland Kitchen GINKGO BILOBA COMPLEX PO Take by mouth.        . insulin NPH-insulin regular (NOVOLIN 70/30) (70-30) 100 UNIT/ML injection Inject 70 Units into the skin. 30 units in the pm        . levothyroxine (SYNTHROID, LEVOTHROID) 125 MCG tablet Take 125 mcg by mouth daily.        Marland Kitchen losartan (COZAAR) 50 MG tablet Take 50 mg by mouth daily.      . Omega-3 Fatty Acids (FISH OIL) 1000 MG CAPS Take by mouth.        . potassium chloride (K-DUR,KLOR-CON) 10 MEQ tablet Take 10 mEq by mouth daily. Take with lasix      . simvastatin (ZOCOR) 20 MG tablet Take 1 tablet (20 mg total) by mouth at bedtime.  30 tablet  6    Review of Systems  Constitutional: Negative.   HENT: Negative.   Eyes: Negative.   Respiratory: Negative.   Cardiovascular: Positive for leg swelling.  Gastrointestinal: Negative.   Musculoskeletal: Positive for back pain, arthralgias and gait problem.  Skin: Negative.   Neurological: Negative.   Hematological: Negative.   Psychiatric/Behavioral: Negative.   All other systems reviewed and are negative.    BP 118/68  Pulse 70  Ht 5\' 4"  (1.626 m)  Wt 210 lb (95.255 kg)  BMI 36.05 kg/m2  Physical Exam  Nursing note and vitals reviewed.  Constitutional: She is oriented to person, place, and time. She appears well-developed and well-nourished.  HENT:  Head: Normocephalic.  Nose: Nose normal.  Mouth/Throat: Oropharynx is clear and moist.  Eyes: Conjunctivae normal are normal. Pupils are equal, round, and reactive to light.  Neck: Normal range of motion. Neck supple. No JVD present.  Cardiovascular: Normal rate, regular rhythm, S1 normal, S2 normal, normal heart sounds and intact distal pulses.  Exam reveals no gallop and no friction rub.   No murmur heard.      Pitting woody edema on the right to below the knee, trace to 1+ pitting on the left LE.  Pulmonary/Chest: Effort normal and breath sounds normal. No respiratory distress. She has no wheezes.  She has no rales. She exhibits no tenderness.  Abdominal: Soft. Bowel sounds are normal. She exhibits no distension. There is no tenderness.  Musculoskeletal: Normal range of motion. She exhibits no edema and no tenderness.  Lymphadenopathy:    She has no cervical adenopathy.  Neurological: She is alert and oriented to person, place, and time. Coordination normal.  Skin: Skin is warm and dry. No rash noted. No erythema.  Psychiatric: She has a normal mood and affect. Her behavior is normal. Judgment and thought content normal.         Assessment and Plan

## 2012-08-10 ENCOUNTER — Ambulatory Visit: Payer: Self-pay

## 2012-09-10 ENCOUNTER — Ambulatory Visit: Payer: Self-pay

## 2012-11-05 ENCOUNTER — Emergency Department: Payer: Self-pay | Admitting: Emergency Medicine

## 2012-11-05 LAB — URINALYSIS, COMPLETE
Bilirubin,UR: NEGATIVE
Glucose,UR: NEGATIVE mg/dL (ref 0–75)
Nitrite: NEGATIVE
Ph: 8 (ref 4.5–8.0)
Squamous Epithelial: 13
WBC UR: 15 /HPF (ref 0–5)

## 2012-11-05 LAB — COMPREHENSIVE METABOLIC PANEL
Albumin: 3.7 g/dL (ref 3.4–5.0)
Alkaline Phosphatase: 99 U/L (ref 50–136)
Anion Gap: 5 — ABNORMAL LOW (ref 7–16)
BUN: 16 mg/dL (ref 7–18)
Calcium, Total: 9.2 mg/dL (ref 8.5–10.1)
Creatinine: 1.64 mg/dL — ABNORMAL HIGH (ref 0.60–1.30)
EGFR (African American): 33 — ABNORMAL LOW
EGFR (Non-African Amer.): 28 — ABNORMAL LOW
Glucose: 134 mg/dL — ABNORMAL HIGH (ref 65–99)
SGPT (ALT): 25 U/L (ref 12–78)
Sodium: 141 mmol/L (ref 136–145)
Total Protein: 7.6 g/dL (ref 6.4–8.2)

## 2012-11-05 LAB — T4, FREE: Free Thyroxine: 0.29 ng/dL — ABNORMAL LOW (ref 0.76–1.46)

## 2012-11-05 LAB — CBC
HCT: 40.8 % (ref 35.0–47.0)
MCHC: 33.2 g/dL (ref 32.0–36.0)
MCV: 101 fL — ABNORMAL HIGH (ref 80–100)
Platelet: 198 10*3/uL (ref 150–440)
RDW: 14.5 % (ref 11.5–14.5)

## 2012-11-05 LAB — TSH: Thyroid Stimulating Horm: 78.6 u[IU]/mL — ABNORMAL HIGH

## 2012-11-05 LAB — CK TOTAL AND CKMB (NOT AT ARMC): CK, Total: 833 U/L — ABNORMAL HIGH (ref 21–215)

## 2012-12-06 ENCOUNTER — Telehealth: Payer: Self-pay | Admitting: *Deleted

## 2012-12-06 NOTE — Telephone Encounter (Signed)
Would schedule her for a visit Meantime, let's try to obtain office note, EKG, everything we can

## 2012-12-06 NOTE — Telephone Encounter (Signed)
Spoke with pt today and she mentioned that she would like to be seen as soon as possible. She went to see her primary care doctor at [facility] and mentioned that they recommended her to f/u with her cardiologist due to her chest x-ray and ekg "not looking good". She mentioned having shortness of breath and wheezing. She is aware that we will contact [facility] to get more information. Please advise.

## 2012-12-06 NOTE — Telephone Encounter (Signed)
lmtcb

## 2012-12-07 NOTE — Telephone Encounter (Signed)
[email or web address]

## 2012-12-09 NOTE — Telephone Encounter (Signed)
I called to assess sob and CHF symptoms. Pt says PCP started her on Proair at [facility] visit and SOB has improved Sleeps sitting up in recliner but says she has been sleeping this way for a "long time" d/t arthritic pain Was also started on Lyrica by PCP for pain C/o edema but says she gets LE wrapped for this Has appt with [place] Monday 2/3 and will keep appt unless she needs [place] sooner for which she will call

## 2012-12-13 ENCOUNTER — Ambulatory Visit (INDEPENDENT_AMBULATORY_CARE_PROVIDER_SITE_OTHER): Payer: Medicare Other | Admitting: Cardiovascular Disease

## 2012-12-13 ENCOUNTER — Encounter: Payer: Self-pay | Admitting: Cardiovascular Disease

## 2012-12-13 VITALS — BP 120/70 | HR 70 | Ht 64.0 in | Wt 217.5 lb

## 2012-12-13 DIAGNOSIS — R0602 Shortness of breath: Secondary | ICD-10-CM

## 2012-12-13 DIAGNOSIS — R609 Edema, unspecified: Secondary | ICD-10-CM

## 2012-12-13 DIAGNOSIS — I10 Essential (primary) hypertension: Secondary | ICD-10-CM

## 2012-12-13 DIAGNOSIS — Z951 Presence of aortocoronary bypass graft: Secondary | ICD-10-CM

## 2012-12-13 DIAGNOSIS — E119 Type 2 diabetes mellitus without complications: Secondary | ICD-10-CM

## 2012-12-13 DIAGNOSIS — R000 Tachycardia, unspecified: Secondary | ICD-10-CM

## 2012-12-13 DIAGNOSIS — E785 Hyperlipidemia, unspecified: Secondary | ICD-10-CM

## 2012-12-13 MED ORDER — TORSEMIDE 20 MG PO TABS: 40.0000 mg | ORAL_TABLET | Freq: Two times a day (BID) | ORAL | Status: DC

## 2012-12-13 MED ORDER — ISOSORBIDE MONONITRATE ER 30 MG PO TB24: 30.0000 mg | ORAL_TABLET | Freq: Every day | ORAL | Status: DC

## 2012-12-13 MED ORDER — POTASSIUM CHLORIDE CRYS ER 10 MEQ PO TBCR: 10.0000 meq | EXTENDED_RELEASE_TABLET | Freq: Two times a day (BID) | ORAL | Status: DC

## 2012-12-13 NOTE — Patient Instructions (Addendum)
Please hold the amlodipine for worsening leg swelling Start isosorbide one a day  Please take extra torsemide after lunch every other day (mon/wed/Friday/Saturday) Take with extra potassium  Please call us if you have new issues that need to be addressed before your next appt.  Your physician wants you to follow-up in:  2 to 3 weeks You will receive a reminder letter in the mail two months in advance. If you don't receive a letter, please call our office to schedule the follow-up appointment.

## 2012-12-13 NOTE — Progress Notes (Signed)
Patient ID: [name] [name], female    DOB: May 12, 1925, 79 y.o.   MRN: [id number]  HPI Comments: [name] [name] is a very pleasant 79 year old female with past medical history of coronary artery disease, bypass surgery, hypertension, diabetes, Pulmonary hypertension (moderate), hyperlipidemia, peripheral vascular disease who presented to [facility] on November 30, 2011 with hypotension and bradycardia. She has chronic lower extremity edema. She was found to be in a junctional rhythm with heart rate of 46 beats per minute, systolic blood pressure of 80. Her Cardizem was held with improvement of her heart rate and blood pressure.  Baseline creatinine 1.5, elevated liver enzymes.  Chronic bilateral lower extremity edema,  seen by vascular in [place] for lymphedema.  Changed in the past from Lasix to torsemide 40 mg daily. She has had worsening shortness of breath, leg edema, skin breakdown. She now has compression wraps in place bilaterally. She has some mild abdominal swelling, sleeps upright.   Recent chest x-ray showing mild interstitial edema she was started on inhalers for shortness of breath by primary care physician.   She stopped her statin on her own as it was disrupting her sleep. This was Lipitor 20 mg daily.  Echocardiogram December 01, 2011 shows normal systolic function, ejection fraction greater than 55%, mild LVH, mild TR, moderate right ventricular systolic pressures.  EKG shows normal sinus rhythm with rate 70 beats per minute with no significant ST or T wave changes   Outpatient Encounter Prescriptions as of 12/13/2012  Medication Sig Dispense Refill  . allopurinol (ZYLOPRIM) 100 MG tablet Take 200 mg by mouth daily.       Marland Kitchen aspirin 81 MG tablet Take 81 mg by mouth daily.      Marland Kitchen atenolol (TENORMIN) 100 MG tablet Take 100 mg by mouth daily.      . colchicine 0.6 MG tablet Take 0.6 mg by mouth daily as needed.       . gabapentin (NEURONTIN) 100 MG capsule Take 100 mg by mouth 2 (two) times  daily.      Marland Kitchen GINKGO BILOBA COMPLEX PO Take by mouth.        . insulin NPH-insulin regular (NOVOLIN 70/30) (70-30) 100 UNIT/ML injection Inject 70 Units into the skin. 30 units in the pm        . levothyroxine (SYNTHROID, LEVOTHROID) 137 MCG tablet Take 137 mcg by mouth daily.      . Omega-3 Fatty Acids (FISH OIL) 1000 MG CAPS Take by mouth. Take two capsules by mouth daily.      . Pregabalin (LYRICA PO) Take 500 mg by mouth 3 (three) times daily.      Marland Kitchen amLODipine (NORVASC) 5 MG tablet Take 5 mg by mouth daily.      .  potassium chloride (K-DUR,KLOR-CON) 10 MEQ tablet Take 10 mEq by mouth daily. Take with lasix      .  torsemide (DEMADEX) 20 MG tablet Take 2 tablets (40 mg total) by mouth daily.  60 tablet  3     Review of Systems  Constitutional: Negative.   HENT: Negative.   Eyes: Negative.   Respiratory: Positive for shortness of breath.   Cardiovascular: Positive for leg swelling.  Gastrointestinal: Negative.   Musculoskeletal: Positive for back pain, arthralgias and gait problem.  Skin: Negative.   Neurological: Negative.   Hematological: Negative.   Psychiatric/Behavioral: Negative.   All other systems reviewed and are negative.    BP 120/70  Pulse 70  Ht 5\' 4"  (1.626 m)  Wt  217 lb 8 oz (98.657 kg)  BMI 37.33 kg/m2  Physical Exam  Nursing note and vitals reviewed. Constitutional: She is oriented to person, place, and time. She appears well-developed and well-nourished.  HENT:  Head: Normocephalic.  Nose: Nose normal.  Mouth/Throat: Oropharynx is clear and moist.  Eyes: Conjunctivae normal are normal. Pupils are equal, round, and reactive to light.  Neck: Normal range of motion. Neck supple. No JVD present.  Cardiovascular: Normal rate, regular rhythm, S1 normal, S2 normal, normal heart sounds and intact distal pulses.  Exam reveals no gallop and no friction rub.   No murmur heard.      Pitting woody edema bilaterally above the knees, leg wraps in place bilaterally   Pulmonary/Chest: Effort normal and breath sounds normal. No respiratory distress. She has no wheezes. She has no rales. She exhibits no tenderness.  Abdominal: Soft. Bowel sounds are normal. She exhibits no distension. There is no tenderness.  Musculoskeletal: Normal range of motion. She exhibits no edema and no tenderness.  Lymphadenopathy:    She has no cervical adenopathy.  Neurological: She is alert and oriented to person, place, and time. Coordination normal.  Skin: Skin is warm and dry. No rash noted. No erythema.  Psychiatric: She has a normal mood and affect. Her behavior is normal. Judgment and thought content normal.         Assessment and Plan

## 2012-12-13 NOTE — Assessment & Plan Note (Signed)
Worsening edema, milligrams. Known moderate pulmonary hypertension from echocardiogram in 2013. Worsening symptoms with 7 pound weight gain since her last clinic visit. We'll increase torsemide to 40 mg twice a day alternating with 40 mg daily, instructed her to decrease her fluid intake. Now with leg breakdown from swelling, worsening shortness of breath. See her again in several weeks' time with basic metabolic panel at that time. We'll need to run her creatinine high given her worsening symptoms.

## 2012-12-13 NOTE — Assessment & Plan Note (Signed)
Currently with no symptoms of angina. No further workup at this time. Continue current medication regimen. 

## 2012-12-13 NOTE — Assessment & Plan Note (Signed)
Blood pressure is adequate today. Amlodipine could be contributing to her lower extremity edema. We'll hold amlodipine, start isosorbide mononitrate 30 mg daily instead.

## 2012-12-13 NOTE — Assessment & Plan Note (Signed)
Shortness of breath likely multifactorial though very concerning for fluid overload and worsening pulmonary hypertension. We have asked her to restrict her fluid intake, double torsemide every other day with extra potassium.

## 2012-12-13 NOTE — Assessment & Plan Note (Signed)
We have encouraged continued exercise, careful diet management in an effort to lose weight. 

## 2012-12-13 NOTE — Assessment & Plan Note (Signed)
She stopped the statin on her own. Did not discuss at today's visit.

## 2012-12-31 ENCOUNTER — Ambulatory Visit (INDEPENDENT_AMBULATORY_CARE_PROVIDER_SITE_OTHER): Payer: Medicare Other | Admitting: Cardiovascular Disease

## 2012-12-31 ENCOUNTER — Encounter: Payer: Self-pay | Admitting: Cardiovascular Disease

## 2012-12-31 VITALS — BP 149/71 | HR 75 | Ht 64.0 in | Wt 207.5 lb

## 2012-12-31 DIAGNOSIS — R609 Edema, unspecified: Secondary | ICD-10-CM

## 2012-12-31 MED ORDER — SIMVASTATIN 40 MG PO TABS: 40.0000 mg | ORAL_TABLET | Freq: Every evening | ORAL | Status: DC

## 2012-12-31 NOTE — Assessment & Plan Note (Signed)
Edema significantly improved on torsemide 20 mg twice a day. 10 pound weight loss in 2 weeks. We have suggested she continue on torsemide 40 mg daily intake afternoon dose 3 times a week. Goal weight is ideally 200 pounds or less.

## 2012-12-31 NOTE — Patient Instructions (Addendum)
You are doing well. Please take torsemide 40 mg daily, Take extra torsemide 40 mg dose after lunch on mon, wed, Friday And take extra torsemide as needed for worsening shortness of breath or edema Take your potassium when you take torsemide   For cholesterol, start simvastatin 40 mg daily  Please call us if you have new issues that need to be addressed before your next appt.  Your physician wants you to follow-up in: 2 months.

## 2012-12-31 NOTE — Assessment & Plan Note (Signed)
Blood pressure is well controlled on today's visit. No changes made to the medications. 

## 2012-12-31 NOTE — Assessment & Plan Note (Signed)
Currently with no symptoms of angina. No further workup at this time. Continue current medication regimen. 

## 2012-12-31 NOTE — Progress Notes (Signed)
Patient ID: [name] [name], female    DOB: [birth year], 79 y.o.   MRN: [id number]  HPI Comments: [name] [name] is a very pleasant 79 year old female with past medical history of coronary artery disease, bypass surgery, hypertension, diabetes, Pulmonary hypertension (moderate), hyperlipidemia, peripheral vascular disease who presented to [facility] on November 30, 2011 with hypotension and bradycardia. She has chronic lower extremity edema. She was found to be in a junctional rhythm with heart rate of 46 beats per minute, systolic blood pressure of 80. Her Cardizem was held with improvement of her heart rate and blood pressure.  Baseline creatinine 1.5, elevated liver enzymes.  Chronic bilateral lower extremity edema,  seen by vascular in [place] for lymphedema.  Changed in the past from Lasix to torsemide 40 mg daily. She has had worsening shortness of breath, leg edema, skin breakdown. She now has compression wraps in place bilaterally. On her last clinic visit several weeks ago, she had some mild abdominal swelling, sleeps upright, worsening edema.  On her last clinic visit we suggested she take torsemide 40 mg twice a day. She has had 10 pound weight loss in 2 weeks, improvement of her shortness of breath, abdominal swelling and some improvement of her edema. She does not track her weight at home. Overall she feels much better.  priorchest x-ray showing mild interstitial edema. she was started on inhalers for shortness of breath by primary care physician.  She stopped her statin on her own as it was disrupting her sleep. This was Lipitor 20 mg daily.  Echocardiogram December 01, 2011 shows normal systolic function, ejection fraction greater than 55%, mild LVH, mild TR, moderate right ventricular systolic pressures.  EKG shows normal sinus rhythm with rate 75 beats per minute with no significant ST or T wave changes   Outpatient Encounter Prescriptions as of 12/31/2012  Medication Sig Dispense Refill   . allopurinol (ZYLOPRIM) 100 MG tablet Take 200 mg by mouth daily.       Marland Kitchen aspirin 81 MG tablet Take 81 mg by mouth daily.      Marland Kitchen atenolol (TENORMIN) 100 MG tablet Take 100 mg by mouth daily.      . colchicine 0.6 MG tablet Take 0.6 mg by mouth daily as needed.       Marland Kitchen GINKGO BILOBA COMPLEX PO Take by mouth.        . insulin NPH-insulin regular (NOVOLIN 70/30) (70-30) 100 UNIT/ML injection Inject 80 Units into the skin. 40 units in the pm      . isosorbide mononitrate (IMDUR) 30 MG 24 hr tablet Take 1 tablet (30 mg total) by mouth daily.  30 tablet  6  . levothyroxine (SYNTHROID, LEVOTHROID) 150 MCG tablet Take 150 mcg by mouth daily.      . Omega-3 Fatty Acids (FISH OIL) 1000 MG CAPS Take by mouth. Take two capsules by mouth daily.      . potassium chloride (K-DUR,KLOR-CON) 10 MEQ tablet Take 1 tablet (10 mEq total) by mouth 2 (two) times daily. Take with lasix  60 tablet  4  . Pregabalin (LYRICA PO) Take 500 mg by mouth 3 (three) times daily.      Marland Kitchen torsemide (DEMADEX) 20 MG tablet Take 2 tablets (40 mg total) by mouth 2 (two) times daily.  120 tablet  3  . gabapentin (NEURONTIN) 100 MG capsule Take 100 mg by mouth 2 (two) times daily.      . simvastatin (ZOCOR) 40 MG tablet Take 1 tablet (40 mg total) by  mouth every evening.  30 tablet  6    Review of Systems  Constitutional: Negative.   HENT: Negative.   Eyes: Negative.   Respiratory: Negative.   Cardiovascular: Positive for leg swelling.  Gastrointestinal: Negative.   Musculoskeletal: Positive for back pain, arthralgias and gait problem.  Skin: Negative.   Neurological: Negative.   Psychiatric/Behavioral: Negative.   All other systems reviewed and are negative.    BP 149/71  Pulse 75  Ht 5\' 4"  (1.626 m)  Wt 207 lb 8 oz (94.121 kg)  BMI 35.6 kg/m2  Physical Exam  Nursing note and vitals reviewed. Constitutional: She is oriented to person, place, and time. She appears well-developed and well-nourished.  HENT:  Head:  Normocephalic.  Nose: Nose normal.  Mouth/Throat: Oropharynx is clear and moist.  Eyes: Conjunctivae are normal. Pupils are equal, round, and reactive to light.  Neck: Normal range of motion. Neck supple. No JVD present.  Cardiovascular: Normal rate, regular rhythm, S1 normal, S2 normal, normal heart sounds and intact distal pulses.  Exam reveals no gallop and no friction rub.   No murmur heard. Trace to 1+ edema, Pitting woody edema bilaterally above the knees, leg wraps in place bilaterally  Pulmonary/Chest: Effort normal and breath sounds normal. No respiratory distress. She has no wheezes. She has no rales. She exhibits no tenderness.  Abdominal: Soft. Bowel sounds are normal. She exhibits no distension. There is no tenderness.  Musculoskeletal: Normal range of motion. She exhibits no edema and no tenderness.  Lymphadenopathy:    She has no cervical adenopathy.  Neurological: She is alert and oriented to person, place, and time. Coordination normal.  Skin: Skin is warm and dry. No rash noted. No erythema.  Psychiatric: She has a normal mood and affect. Her behavior is normal. Judgment and thought content normal.    Assessment and Plan       is

## 2013-01-03 ENCOUNTER — Other Ambulatory Visit: Payer: Self-pay | Admitting: Cardiovascular Disease

## 2013-01-03 NOTE — Telephone Encounter (Signed)
Refilled Potassium sent to [facility].

## 2013-01-14 ENCOUNTER — Ambulatory Visit: Payer: Medicare Other | Admitting: Internal Medicine

## 2013-02-21 ENCOUNTER — Other Ambulatory Visit: Payer: Self-pay | Admitting: *Deleted

## 2013-02-21 MED ORDER — SIMVASTATIN 40 MG PO TABS: 40.0000 mg | ORAL_TABLET | Freq: Every evening | ORAL | Status: DC

## 2013-02-21 NOTE — Telephone Encounter (Signed)
Pt requested 90 day supply sent to [facility].

## 2013-02-21 NOTE — Telephone Encounter (Signed)
Refilled simvastatin sent to [facility].

## 2013-03-04 ENCOUNTER — Encounter: Payer: Self-pay | Admitting: Cardiovascular Disease

## 2013-03-04 ENCOUNTER — Ambulatory Visit (INDEPENDENT_AMBULATORY_CARE_PROVIDER_SITE_OTHER): Payer: Medicare Other | Admitting: Cardiovascular Disease

## 2013-03-04 VITALS — BP 150/80 | HR 93 | Ht 66.0 in | Wt 188.5 lb

## 2013-03-04 DIAGNOSIS — R609 Edema, unspecified: Secondary | ICD-10-CM

## 2013-03-04 DIAGNOSIS — Z951 Presence of aortocoronary bypass graft: Secondary | ICD-10-CM

## 2013-03-04 DIAGNOSIS — I509 Heart failure, unspecified: Secondary | ICD-10-CM

## 2013-03-04 DIAGNOSIS — E785 Hyperlipidemia, unspecified: Secondary | ICD-10-CM

## 2013-03-04 DIAGNOSIS — R000 Tachycardia, unspecified: Secondary | ICD-10-CM

## 2013-03-04 DIAGNOSIS — I10 Essential (primary) hypertension: Secondary | ICD-10-CM

## 2013-03-04 DIAGNOSIS — I5032 Chronic diastolic (congestive) heart failure: Secondary | ICD-10-CM | POA: Insufficient documentation

## 2013-03-04 DIAGNOSIS — E119 Type 2 diabetes mellitus without complications: Secondary | ICD-10-CM

## 2013-03-04 MED ORDER — ATENOLOL 100 MG PO TABS: 100.0000 mg | ORAL_TABLET | Freq: Two times a day (BID) | ORAL | Status: DC

## 2013-03-04 NOTE — Assessment & Plan Note (Signed)
Goal LDL less than 70. Continue on simvastatin

## 2013-03-04 NOTE — Assessment & Plan Note (Addendum)
Diastolic CHF also with pulmonary hypertension. Dramatic improvement in her weight and edema with aggressive diuresis. She will closely monitor her weight. She will use the details as below:  If your weight at home is 181 to 186, take torsemide one pill a day If weight of 187 or more, take two torsemide in the morning For weight 180 or less, no torsemide For weight greater than 190 at home, call the office

## 2013-03-04 NOTE — Assessment & Plan Note (Signed)
Currently with no symptoms of angina. No further workup at this time. Continue current medication regimen. 

## 2013-03-04 NOTE — Assessment & Plan Note (Signed)
She was down 100 mg atenolol in the morning, and 50 mg in the p.m.

## 2013-03-04 NOTE — Progress Notes (Signed)
Patient ID: [name] [name], female    DOB: 1925/01/18, 79 y.o.   MRN: [id number]  HPI Comments: [name] [name] is a very pleasant 79 year old female with past medical history of coronary artery disease, bypass surgery, hypertension, diabetes, Pulmonary hypertension (moderate), hyperlipidemia, peripheral vascular disease who presented to [facility] on November 30, 2011 with hypotension and bradycardia. She has chronic lower extremity edema. She was found to be in a junctional rhythm with heart rate of 46 beats per minute, systolic blood pressure of 80. Her Cardizem was held with improvement of her heart rate and blood pressure.  Baseline creatinine 1.5, elevated liver enzymes.  Chronic bilateral lower extremity edema,  seen by vascular in [place] for lymphedema.  Changed in the past from Lasix to torsemide 40 mg daily. She has had worsening shortness of breath, leg edema, skin breakdown.  She started wearing compression wraps  bilaterally.  On higher dose diuretics, she has lost 30 pounds.   Initially she was taking torsemide 40 mg twice a day. Now is taking torsemide 20 mg daily though there is medication confusion . She's not tracking her weight at home. Edema has significantly improved. Continues to have edema in her knees and thighs. Wraps in place  Prior chest x-ray showing mild interstitial edema. she was started on inhalers for shortness of breath by primary care physician.  She stopped her statin on her own as it was disrupting her sleep. This was Lipitor 20 mg daily.  Echocardiogram December 01, 2011 shows normal systolic function, ejection fraction greater than 55%, mild LVH, mild TR, moderate right ventricular systolic pressures. CT scan of the carotid arteries shows calcification  EKG shows normal sinus rhythm with rate 75 beats per minute with no significant ST or T wave changes   Outpatient Encounter Prescriptions as of 03/04/2013  Medication Sig Dispense Refill  . allopurinol (ZYLOPRIM)  100 MG tablet Take 200 mg by mouth daily.       Marland Kitchen amLODipine (NORVASC) 5 MG tablet Take 5 mg by mouth daily.      Marland Kitchen aspirin 81 MG tablet Take 81 mg by mouth daily.      Marland Kitchen atenolol (TENORMIN) 100 MG tablet Take 1 tablet (100 mg total) by mouth 2 (two) times daily. 100 in the Am And 50 mg in the pm  45 tablet  6  . colchicine 0.6 MG tablet Take 0.6 mg by mouth daily as needed.       . ferrous sulfate 325 (65 FE) MG tablet Take 325 mg by mouth daily with breakfast.      . gabapentin (NEURONTIN) 100 MG capsule Take 100 mg by mouth 2 (two) times daily.      Marland Kitchen GINKGO BILOBA COMPLEX PO Take by mouth.        . isosorbide mononitrate (IMDUR) 30 MG 24 hr tablet Take 1 tablet (30 mg total) by mouth daily.  30 tablet  6  . LEVEMIR 100 UNIT/ML injection Takes 60 units am and 30 units pm daily.      Marland Kitchen levothyroxine (SYNTHROID, LEVOTHROID) 150 MCG tablet Take 150 mcg by mouth daily.      Marland Kitchen losartan (COZAAR) 50 MG tablet Take 50 mg by mouth daily.      [name] DELICA LANCETS 33G MISC daily.       [name] VERIO test strip daily.       . potassium chloride (K-DUR) 10 MEQ tablet TAKE ONE TABLET BY MOUTH EVERY DAY  90 tablet  3  .  Pregabalin (LYRICA PO) Take 500 mg by mouth 3 (three) times daily.      . simvastatin (ZOCOR) 40 MG tablet Take 40 mg by mouth daily.      Marland Kitchen torsemide (DEMADEX) 20 MG tablet Take 2 tablets (40 mg total) by mouth 2 (two) times daily.  120 tablet  3  . warfarin (COUMADIN) 3 MG tablet as directed.        Review of Systems  Constitutional: Negative.   HENT: Negative.   Eyes: Negative.   Respiratory: Negative.   Cardiovascular: Positive for leg swelling.  Gastrointestinal: Negative.   Musculoskeletal: Positive for back pain, arthralgias and gait problem.  Skin: Negative.   Neurological: Negative.   Psychiatric/Behavioral: Negative.   All other systems reviewed and are negative.    BP 150/80  Ht 5\' 6"  (1.676 m)  Wt 188 lb 8 oz (85.503 kg)  BMI 30.44 kg/m2  Physical Exam   Nursing note and vitals reviewed. Constitutional: She is oriented to person, place, and time. She appears well-developed and well-nourished.  HENT:  Head: Normocephalic.  Nose: Nose normal.  Mouth/Throat: Oropharynx is clear and moist.  Eyes: Conjunctivae are normal. Pupils are equal, round, and reactive to light.  Neck: Normal range of motion. Neck supple. No JVD present.  Cardiovascular: Normal rate, regular rhythm, S1 normal, S2 normal, normal heart sounds and intact distal pulses.  Exam reveals no gallop and no friction rub.   No murmur heard. Trace to 1+ edema, Pitting woody edema bilaterally above the knees, leg wraps in place bilaterally  Pulmonary/Chest: Effort normal and breath sounds normal. No respiratory distress. She has no wheezes. She has no rales. She exhibits no tenderness.  Abdominal: Soft. Bowel sounds are normal. She exhibits no distension. There is no tenderness.  Musculoskeletal: Normal range of motion. She exhibits no edema and no tenderness.  Lymphadenopathy:    She has no cervical adenopathy.  Neurological: She is alert and oriented to person, place, and time. Coordination normal.  Skin: Skin is warm and dry. No rash noted. No erythema.  Psychiatric: She has a normal mood and affect. Her behavior is normal. Judgment and thought content normal.    Assessment and Plan

## 2013-03-04 NOTE — Assessment & Plan Note (Signed)
We have encouraged continued exercise, careful diet management in an effort to lose weight. 

## 2013-03-04 NOTE — Assessment & Plan Note (Signed)
Leg wraps in place. Significantly improved edema. Still with edema in the knee and  lower thigh area. we'll push her weight down several more pounds with guidelines as detailed.

## 2013-03-04 NOTE — Assessment & Plan Note (Signed)
We will had an additional atenolol 50 mg in the evening for tachycardia

## 2013-03-04 NOTE — Patient Instructions (Addendum)
If your weight at home is 181 to 186, take torsemide one pill a day If weight of 187 or more, take two torsemide in the morning For weight 180 or less, no torsemide  For weight greater than 190 at home, call the office  Please take atenolol 100 mg in the Am and 50 mg in the PM  Please call us if you have new issues that need to be addressed before your next appt.  Your physician wants you to follow-up in: 3 months.  You will receive a reminder letter in the mail two months in advance. If you don't receive a letter, please call our office to schedule the follow-up appointment.

## 2013-03-08 ENCOUNTER — Telehealth: Payer: Self-pay

## 2013-03-08 NOTE — Telephone Encounter (Signed)
Message copied by Festus Aloe on Tue Mar 08, 2013  4:45 PM ------      Message from: Theador Hawthorne      Created: Fri Mar 04, 2013  4:43 PM       Patient is open with [place].  We will have RN also do med management while out in the home.            Thank you      Tamela Oddi (covering for [place])            ----- Message -----         From: Festus Aloe, CMA         Sent: 03/04/2013   2:38 PM           To: Henderson Newcomer            Referral from Dr. Mariah Milling for home health nurse to help patient's with medications.             Please advise what you need from [place].            Thanks,             Gwynneth Munson             ------

## 2013-03-08 NOTE — Telephone Encounter (Signed)
FYI

## 2013-06-10 ENCOUNTER — Ambulatory Visit: Payer: Medicare Other | Admitting: Cardiovascular Disease

## 2013-06-17 ENCOUNTER — Encounter: Payer: Self-pay | Admitting: Cardiovascular Disease

## 2013-06-17 ENCOUNTER — Ambulatory Visit (INDEPENDENT_AMBULATORY_CARE_PROVIDER_SITE_OTHER): Payer: Medicare Other | Admitting: Cardiovascular Disease

## 2013-06-17 VITALS — BP 120/60 | HR 89 | Ht 64.0 in | Wt 182.0 lb

## 2013-06-17 DIAGNOSIS — Z951 Presence of aortocoronary bypass graft: Secondary | ICD-10-CM

## 2013-06-17 DIAGNOSIS — R0602 Shortness of breath: Secondary | ICD-10-CM

## 2013-06-17 DIAGNOSIS — E119 Type 2 diabetes mellitus without complications: Secondary | ICD-10-CM

## 2013-06-17 DIAGNOSIS — I509 Heart failure, unspecified: Secondary | ICD-10-CM

## 2013-06-17 DIAGNOSIS — R609 Edema, unspecified: Secondary | ICD-10-CM

## 2013-06-17 DIAGNOSIS — I10 Essential (primary) hypertension: Secondary | ICD-10-CM

## 2013-06-17 DIAGNOSIS — I5032 Chronic diastolic (congestive) heart failure: Secondary | ICD-10-CM

## 2013-06-17 NOTE — Assessment & Plan Note (Signed)
She reports diabetes is managed by primary care physician.

## 2013-06-17 NOTE — Assessment & Plan Note (Signed)
Currently with no symptoms of angina. No further workup at this time. Continue current medication regimen. 

## 2013-06-17 NOTE — Progress Notes (Signed)
Patient ID: [name] [name], female    DOB: [birth year], 79 y.o.   MRN: [id number]  HPI Comments: [name] [name] is a very pleasant 79 year old female with past medical history of coronary artery disease, bypass surgery, hypertension, diabetes, Pulmonary hypertension (moderate), hyperlipidemia, peripheral vascular disease who presented to [facility] on November 30, 2011 with hypotension and bradycardia. She has chronic lower extremity edema. She was found to be in a junctional rhythm with heart rate of 46 beats per minute, systolic blood pressure of 80. Her Cardizem was held with improvement of her heart rate and blood pressure.  Baseline creatinine 1.5, elevated liver enzymes.  Chronic bilateral lower extremity edema,  seen by vascular in [place] for lymphedema.  Changed in the past from Lasix to torsemide 40 mg daily. She has had worsening shortness of breath, leg edema, skin breakdown.  She started wearing compression wraps  bilaterally.  On higher dose diuretics, she  lost 30 pounds.   Initially she was taking torsemide 40 mg twice a day. On her last visit she was taking torsemide 20 mg daily  She's not tracking her weight at home. Edema has significantly improved. She no longer has the leg wraps, but does wear TED hose with zippers. She reports that her sugars have been running high, otherwise she feels well. Dr. Rockey Situ has been helping her with diabetes management.  Tolerating simvastatin  Echocardiogram December 01, 2011 shows normal systolic function, ejection fraction greater than 55%, mild LVH, mild TR, moderate right ventricular systolic pressures. CT scan of the carotid arteries shows calcification  EKG shows normal sinus rhythm with rate 89 beats per minute with no significant ST or T wave changes   Outpatient Encounter Prescriptions as of 06/17/2013  Medication Sig Dispense Refill  . allopurinol (ZYLOPRIM) 100 MG tablet Take 200 mg by mouth daily.       Marland Kitchen amLODipine (NORVASC) 5 MG tablet  Take 5 mg by mouth daily.      Marland Kitchen aspirin 81 MG tablet Take 81 mg by mouth daily.      Marland Kitchen atenolol (TENORMIN) 100 MG tablet Take 1 tablet (100 mg total) by mouth 2 (two) times daily. 100 in the Am And 50 mg in the pm  45 tablet  6  . gabapentin (NEURONTIN) 100 MG capsule Take 100 mg by mouth 2 (two) times daily.      Marland Kitchen GINKGO BILOBA COMPLEX PO Take by mouth.        . isosorbide mononitrate (IMDUR) 30 MG 24 hr tablet Take 1 tablet (30 mg total) by mouth daily.  30 tablet  6  . KLOR-CON M10 10 MEQ tablet Take 10 mEq by mouth daily.       Marland Kitchen LEVEMIR 100 UNIT/ML injection Takes 60 units am and 30 units pm daily.      Marland Kitchen levothyroxine (SYNTHROID, LEVOTHROID) 150 MCG tablet Take 150 mcg by mouth daily.      Marland Kitchen losartan (COZAAR) 50 MG tablet Take 50 mg by mouth daily.      Marland Kitchen nystatin cream (MYCOSTATIN) Apply 1 application topically 2 (two) times daily.      [name] DELICA LANCETS 33G MISC daily.       [name] VERIO test strip daily.       . Pregabalin (LYRICA PO) Take 500 mg by mouth 3 (three) times daily.      . simvastatin (ZOCOR) 40 MG tablet Take 40 mg by mouth daily.      Marland Kitchen torsemide (DEMADEX) 20 MG  tablet Take 1 tablet (20 mg total)  daily.  120 tablet  3  . VICTOZA 18 MG/3ML SOPN daily.       . [DISCONTINUED] colchicine 0.6 MG tablet Take 0.6 mg by mouth daily as needed.       . [DISCONTINUED] ferrous sulfate 325 (65 FE) MG tablet Take 325 mg by mouth daily with breakfast.      . [DISCONTINUED] nystatin cream (MYCOSTATIN)       . [DISCONTINUED] potassium chloride (K-DUR) 10 MEQ tablet TAKE ONE TABLET BY MOUTH EVERY DAY  90 tablet  3  . [DISCONTINUED] warfarin (COUMADIN) 3 MG tablet as directed.         Review of Systems  Constitutional: Negative.   HENT: Negative.   Eyes: Negative.   Respiratory: Negative.   Cardiovascular: Positive for leg swelling.       Leg edema improved  Gastrointestinal: Negative.   Musculoskeletal: Positive for back pain, arthralgias and gait problem.  Skin:  Negative.   Neurological: Negative.   Psychiatric/Behavioral: Negative.   All other systems reviewed and are negative.    BP 120/60  Pulse 89  Ht 5\' 4"  (1.626 m)  Wt 182 lb (82.555 kg)  BMI 31.22 kg/m2  Physical Exam  Nursing note and vitals reviewed. Constitutional: She is oriented to person, place, and time. She appears well-developed and well-nourished.  HENT:  Head: Normocephalic.  Nose: Nose normal.  Mouth/Throat: Oropharynx is clear and moist.  Eyes: Conjunctivae are normal. Pupils are equal, round, and reactive to light.  Neck: Normal range of motion. Neck supple. No JVD present.  Cardiovascular: Normal rate, regular rhythm, S1 normal, S2 normal, normal heart sounds and intact distal pulses.  Exam reveals no gallop and no friction rub.   No murmur heard. Trace to 1+ edema, Pitting woody edema bilaterally above the knees, leg wraps in place bilaterally  Pulmonary/Chest: Effort normal and breath sounds normal. No respiratory distress. She has no wheezes. She has no rales. She exhibits no tenderness.  Abdominal: Soft. Bowel sounds are normal. She exhibits no distension. There is no tenderness.  Musculoskeletal: Normal range of motion. She exhibits no edema and no tenderness.  Lymphadenopathy:    She has no cervical adenopathy.  Neurological: She is alert and oriented to person, place, and time. Coordination normal.  Skin: Skin is warm and dry. No rash noted. No erythema.  Psychiatric: She has a normal mood and affect. Her behavior is normal. Judgment and thought content normal.    Assessment and Plan

## 2013-06-17 NOTE — Assessment & Plan Note (Signed)
Appears to be doing well. Encouraged her to stay on her torsemide 20 mg daily. If weight climbs for edema gets worse, she could take extra torsemide as needed.

## 2013-06-17 NOTE — Assessment & Plan Note (Signed)
Edema is significantly improved after leg wraps, now using TED hose with zippers

## 2013-06-17 NOTE — Assessment & Plan Note (Signed)
She reports her shortness of breath is mild, stable. Encouraged aggressive CHF management

## 2013-06-17 NOTE — Patient Instructions (Addendum)
You are doing well. No medication changes were made.  Please call us if you have new issues that need to be addressed before your next appt.  Your physician wants you to follow-up in: 6 months.  You will receive a reminder letter in the mail two months in advance. If you don't receive a letter, please call our office to schedule the follow-up appointment.   

## 2013-07-19 ENCOUNTER — Other Ambulatory Visit: Payer: Self-pay | Admitting: Cardiovascular Disease

## 2013-11-21 ENCOUNTER — Other Ambulatory Visit: Payer: Self-pay | Admitting: Cardiovascular Disease

## 2013-11-21 ENCOUNTER — Other Ambulatory Visit: Payer: Self-pay | Admitting: *Deleted

## 2013-11-21 MED ORDER — POTASSIUM CHLORIDE CRYS ER 10 MEQ PO TBCR: 10.0000 meq | EXTENDED_RELEASE_TABLET | Freq: Every day | ORAL | Status: DC

## 2013-11-21 NOTE — Telephone Encounter (Signed)
Requested Prescriptions   Signed Prescriptions Disp Refills  . potassium chloride (KLOR-CON M10) 10 MEQ tablet 30 tablet 3    Sig: Take 1 tablet (10 mEq total) by mouth daily.    Authorizing Provider: Antonieta IbaGOLLAN, TIMOTHY J    Ordering User: Kendrick FriesLOPEZ, MARINA C

## 2013-12-09 ENCOUNTER — Other Ambulatory Visit: Payer: Self-pay

## 2013-12-09 MED ORDER — POTASSIUM CHLORIDE CRYS ER 10 MEQ PO TBCR
EXTENDED_RELEASE_TABLET | ORAL | Status: DC
Start: 2013-12-09 — End: 2013-12-22

## 2013-12-09 MED ORDER — SIMVASTATIN 40 MG PO TABS: 40.0000 mg | ORAL_TABLET | Freq: Every day | ORAL | Status: DC

## 2013-12-09 MED ORDER — ATENOLOL 100 MG PO TABS: 100.0000 mg | ORAL_TABLET | Freq: Two times a day (BID) | ORAL | Status: DC

## 2013-12-09 MED ORDER — TORSEMIDE 20 MG PO TABS: 40.0000 mg | ORAL_TABLET | Freq: Two times a day (BID) | ORAL | Status: DC

## 2013-12-09 MED ORDER — ISOSORBIDE MONONITRATE ER 30 MG PO TB24: ORAL_TABLET | ORAL | Status: DC

## 2013-12-09 MED ORDER — POTASSIUM CHLORIDE CRYS ER 10 MEQ PO TBCR: 10.0000 meq | EXTENDED_RELEASE_TABLET | Freq: Every day | ORAL | Status: DC

## 2013-12-12 ENCOUNTER — Other Ambulatory Visit: Payer: Self-pay

## 2013-12-12 MED ORDER — ATENOLOL 100 MG PO TABS: 100.0000 mg | ORAL_TABLET | Freq: Every day | ORAL | Status: DC

## 2013-12-12 NOTE — Telephone Encounter (Signed)
Patient is taking Atenolol 100 mg one tablet daily.

## 2013-12-22 ENCOUNTER — Other Ambulatory Visit: Payer: Self-pay | Admitting: *Deleted

## 2013-12-22 MED ORDER — POTASSIUM CHLORIDE CRYS ER 10 MEQ PO TBCR: 10.0000 meq | EXTENDED_RELEASE_TABLET | Freq: Every day | ORAL | Status: DC

## 2013-12-22 MED ORDER — POTASSIUM CHLORIDE CRYS ER 10 MEQ PO TBCR: EXTENDED_RELEASE_TABLET | ORAL | Status: DC

## 2013-12-22 MED ORDER — ATENOLOL 100 MG PO TABS: 100.0000 mg | ORAL_TABLET | Freq: Every day | ORAL | Status: DC

## 2013-12-22 MED ORDER — SIMVASTATIN 40 MG PO TABS: 40.0000 mg | ORAL_TABLET | Freq: Every day | ORAL | Status: DC

## 2013-12-22 MED ORDER — TORSEMIDE 20 MG PO TABS: 40.0000 mg | ORAL_TABLET | Freq: Two times a day (BID) | ORAL | Status: DC

## 2013-12-22 MED ORDER — ISOSORBIDE MONONITRATE ER 30 MG PO TB24: ORAL_TABLET | ORAL | Status: DC

## 2013-12-22 NOTE — Telephone Encounter (Signed)
Requested Prescriptions   Signed Prescriptions Disp Refills  . torsemide (DEMADEX) 20 MG tablet 360 tablet 0    Sig: Take 2 tablets (40 mg total) by mouth 2 (two) times daily.    Authorizing Provider: Antonieta IbaGOLLAN, TIMOTHY J    Ordering User: Shawnie DapperLOPEZ, Tegan Burnside C  . potassium chloride (KLOR-CON M10) 10 MEQ tablet 90 tablet 0    Sig: TAKE ONE TABLET BY MOUTH TWICE DAILY WITH LASIX    Authorizing Provider: Antonieta IbaGOLLAN, TIMOTHY J    Ordering User: Jamea Robicheaux C  . potassium chloride (KLOR-CON M10) 10 MEQ tablet 90 tablet 0    Sig: Take 1 tablet (10 mEq total) by mouth daily.    Authorizing Provider: Antonieta IbaGOLLAN, TIMOTHY J    Ordering User: Kendrick FriesLOPEZ, Amiah Frohlich C  . simvastatin (ZOCOR) 40 MG tablet 90 tablet 0    Sig: Take 1 tablet (40 mg total) by mouth daily.    Authorizing Provider: Antonieta IbaGOLLAN, TIMOTHY J    Ordering User: Shawnie DapperLOPEZ, Raylee Strehl C  . atenolol (TENORMIN) 100 MG tablet 90 tablet 0    Sig: Take 1 tablet (100 mg total) by mouth daily.    Authorizing Provider: Antonieta IbaGOLLAN, TIMOTHY J    Ordering User: Shawnie DapperLOPEZ, Gustavo Dispenza C  . isosorbide mononitrate (IMDUR) 30 MG 24 hr tablet 90 tablet 0    Sig: TAKE ONE TABLET BY MOUTH EVERY DAY    Authorizing Provider: Antonieta IbaGOLLAN, TIMOTHY J    Ordering User: Kendrick FriesLOPEZ, Vinnie Bobst C

## 2013-12-30 ENCOUNTER — Other Ambulatory Visit: Payer: Self-pay | Admitting: *Deleted

## 2013-12-30 MED ORDER — SIMVASTATIN 40 MG PO TABS: 40.0000 mg | ORAL_TABLET | Freq: Every day | ORAL | Status: DC

## 2013-12-30 NOTE — Telephone Encounter (Signed)
Requested Prescriptions   Signed Prescriptions Disp Refills  . simvastatin (ZOCOR) 40 MG tablet 90 tablet 3    Sig: Take 1 tablet (40 mg total) by mouth daily.    Authorizing Provider: GOLLAN, TIMOTHY J    Ordering User: Brain Honeycutt C    

## 2014-01-02 ENCOUNTER — Other Ambulatory Visit: Payer: Self-pay | Admitting: *Deleted

## 2014-01-02 MED ORDER — TORSEMIDE 20 MG PO TABS: 40.0000 mg | ORAL_TABLET | Freq: Two times a day (BID) | ORAL | Status: DC

## 2014-01-02 MED ORDER — ISOSORBIDE MONONITRATE ER 30 MG PO TB24: ORAL_TABLET | ORAL | Status: DC

## 2014-01-02 NOTE — Telephone Encounter (Signed)
Requested Prescriptions   Signed Prescriptions Disp Refills  . torsemide (DEMADEX) 20 MG tablet 360 tablet 3    Sig: Take 2 tablets (40 mg total) by mouth 2 (two) times daily.    Authorizing Provider: Antonieta IbaGOLLAN, TIMOTHY J    Ordering User: Shawnie DapperLOPEZ, MARINA C  . isosorbide mononitrate (IMDUR) 30 MG 24 hr tablet 90 tablet 3    Sig: TAKE ONE TABLET BY MOUTH EVERY DAY    Authorizing Provider: Antonieta IbaGOLLAN, TIMOTHY J    Ordering User: Kendrick FriesLOPEZ, MARINA C

## 2014-01-04 ENCOUNTER — Other Ambulatory Visit: Payer: Self-pay | Admitting: *Deleted

## 2014-01-04 MED ORDER — ATENOLOL 100 MG PO TABS: 100.0000 mg | ORAL_TABLET | Freq: Every day | ORAL | Status: DC

## 2014-01-04 NOTE — Telephone Encounter (Signed)
Requested Prescriptions   Signed Prescriptions Disp Refills  . atenolol (TENORMIN) 100 MG tablet 90 tablet 3    Sig: Take 1 tablet (100 mg total) by mouth daily.    Authorizing Provider: Antonieta IbaGOLLAN, TIMOTHY J    Ordering User: Kendrick FriesLOPEZ, Debra Calabretta C

## 2014-01-13 ENCOUNTER — Encounter: Payer: Self-pay | Admitting: Cardiovascular Disease

## 2014-01-13 ENCOUNTER — Ambulatory Visit (INDEPENDENT_AMBULATORY_CARE_PROVIDER_SITE_OTHER): Payer: Medicare PPO | Admitting: Cardiovascular Disease

## 2014-01-13 VITALS — BP 120/64 | HR 93 | Ht 64.0 in | Wt 187.0 lb

## 2014-01-13 DIAGNOSIS — Z951 Presence of aortocoronary bypass graft: Secondary | ICD-10-CM

## 2014-01-13 DIAGNOSIS — R609 Edema, unspecified: Secondary | ICD-10-CM

## 2014-01-13 DIAGNOSIS — R000 Tachycardia, unspecified: Secondary | ICD-10-CM

## 2014-01-13 DIAGNOSIS — E119 Type 2 diabetes mellitus without complications: Secondary | ICD-10-CM

## 2014-01-13 DIAGNOSIS — I10 Essential (primary) hypertension: Secondary | ICD-10-CM

## 2014-01-13 DIAGNOSIS — I5032 Chronic diastolic (congestive) heart failure: Secondary | ICD-10-CM

## 2014-01-13 DIAGNOSIS — I509 Heart failure, unspecified: Secondary | ICD-10-CM

## 2014-01-13 DIAGNOSIS — E785 Hyperlipidemia, unspecified: Secondary | ICD-10-CM

## 2014-01-13 NOTE — Assessment & Plan Note (Signed)
Currently with no symptoms of angina. No further workup at this time. Continue current medication regimen. 

## 2014-01-13 NOTE — Progress Notes (Signed)
Patient ID: [name] [name], female    DOB: [birth year], 80 y.o.   MRN: [id number]  HPI Comments: [name] [name] is a very pleasant 80 year old female with past medical history of coronary artery disease, bypass surgery, hypertension, diabetes, chronic lower extremity edema, Pulmonary hypertension (moderate), hyperlipidemia, peripheral vascular disease who presented to [facility] on November 30, 2011 with hypotension and bradycardia.  She was found to be in a junctional rhythm with heart rate of 46 beats per minute, systolic blood pressure of 80. Her Cardizem was held with improvement of her heart rate and blood pressure.  Baseline creatinine 1.5, elevated liver enzymes.  Chronic bilateral lower extremity edema,  seen in the past by vascular in [place] for lymphedema.  Changed in the past from Lasix to torsemide 40 mg daily. She has had worsening shortness of breath, leg edema, skin breakdown.  She started wearing compression wraps  bilaterally.  On higher dose diuretics, she  lost 30 pounds. Initially she was taking torsemide 40 mg twice a day. On her last visit she was taking torsemide 20 mg daily   In followup today, she reports that she takes torsemide 20 mg daily. Weight is up slightly, 6 pounds from her prior clinic visit. Continues to have minimal edema, wears compression hose sometimes. She has TED hose She reports that diabetes numbers have been up and down, generally running high She is otherwise active, go shopping, cooking, does her house chores  Tolerating simvastatin  Echocardiogram December 01, 2011 shows normal systolic function, ejection fraction greater than 55%, mild LVH, mild TR, moderate right ventricular systolic pressures. CT scan of the carotid arteries shows calcification  EKG shows normal sinus rhythm with rate 93 beats per minute with no significant ST or T wave changes  Outpatient Encounter Prescriptions as of 01/13/2014  Medication Sig  . allopurinol (ZYLOPRIM) 100 MG tablet  Take 200 mg by mouth as needed.   Marland Kitchen amLODipine (NORVASC) 5 MG tablet Take 5 mg by mouth daily.  Marland Kitchen aspirin 81 MG tablet Take 81 mg by mouth daily.  Marland Kitchen atenolol (TENORMIN) 100 MG tablet Take 1 tablet (100 mg total) by mouth daily.  Marland Kitchen GINKGO BILOBA COMPLEX PO Take 120 mg by mouth daily.   . isosorbide mononitrate (IMDUR) 30 MG 24 hr tablet TAKE ONE TABLET BY MOUTH EVERY DAY  . LEVEMIR 100 UNIT/ML injection Takes 75 units am and 45 units pm daily.  Marland Kitchen levothyroxine (SYNTHROID, LEVOTHROID) 125 MCG tablet Take 125 mcg by mouth daily before breakfast.  . losartan (COZAAR) 50 MG tablet Take 50 mg by mouth daily.  Marland Kitchen nystatin cream (MYCOSTATIN) Apply 1 application topically as needed.   [name] DELICA LANCETS 33G MISC daily.   [name] VERIO test strip daily.   . potassium chloride (KLOR-CON M10) 10 MEQ tablet TAKE ONE TABLET BY MOUTH TWICE DAILY WITH LASIX  . Pregabalin (LYRICA PO) Take 500 mg by mouth 3 (three) times daily.  . simvastatin (ZOCOR) 40 MG tablet Take 1 tablet (40 mg total) by mouth daily.  Marland Kitchen torsemide (DEMADEX) 20 MG tablet Take 2 tablets (40 mg total) by mouth 2 (two) times daily.  Marland Kitchen VICTOZA 18 MG/3ML SOPN daily.     Review of Systems  Constitutional: Negative.   HENT: Negative.   Eyes: Negative.   Respiratory: Negative.   Cardiovascular: Positive for leg swelling.       Leg edema improved  Gastrointestinal: Negative.   Endocrine: Negative.   Musculoskeletal: Positive for arthralgias, back pain and gait  problem.  Skin: Negative.   Allergic/Immunologic: Negative.   Neurological: Negative.   Hematological: Negative.   Psychiatric/Behavioral: Negative.   All other systems reviewed and are negative.    BP 120/64  Pulse 93  Ht 5\' 4"  (1.626 m)  Wt 187 lb (84.823 kg)  BMI 32.08 kg/m2  Physical Exam  Nursing note and vitals reviewed. Constitutional: She is oriented to person, place, and time. She appears well-developed and well-nourished.  HENT:  Head: Normocephalic.   Nose: Nose normal.  Mouth/Throat: Oropharynx is clear and moist.  Eyes: Conjunctivae are normal. Pupils are equal, round, and reactive to light.  Neck: Normal range of motion. Neck supple. No JVD present.  Cardiovascular: Normal rate, regular rhythm, S1 normal, S2 normal, normal heart sounds and intact distal pulses.  Exam reveals no gallop and no friction rub.   No murmur heard. Trace to 1+ edema, Pitting woody edema bilaterally above the knees, leg wraps in place bilaterally  Pulmonary/Chest: Effort normal and breath sounds normal. No respiratory distress. She has no wheezes. She has no rales. She exhibits no tenderness.  Abdominal: Soft. Bowel sounds are normal. She exhibits no distension. There is no tenderness.  Musculoskeletal: Normal range of motion. She exhibits no edema and no tenderness.  Lymphadenopathy:    She has no cervical adenopathy.  Neurological: She is alert and oriented to person, place, and time. Coordination normal.  Skin: Skin is warm and dry. No rash noted. No erythema.  Psychiatric: She has a normal mood and affect. Her behavior is normal. Judgment and thought content normal.    Assessment and Plan

## 2014-01-13 NOTE — Assessment & Plan Note (Signed)
Weight is up several pounds. She does have edema of her left lower extremity around the ankle, right leg with lymphedema. Recommended she stay on torsemide 20 mg daily, extra torsemide as needed for weight gain or worsening edema of her left lower extremity

## 2014-01-13 NOTE — Assessment & Plan Note (Signed)
Blood pressure is well controlled on today's visit. No changes made to the medications. 

## 2014-01-13 NOTE — Assessment & Plan Note (Signed)
Most recent lipid panel not available. Goal LDL less than 70. 

## 2014-01-13 NOTE — Assessment & Plan Note (Signed)
We have encouraged continued exercise, careful diet management in an effort to lose weight. 

## 2014-01-13 NOTE — Assessment & Plan Note (Signed)
Edema on the right likely from lymphedema or venous insufficiency. Minimal edema on the left likely from diastolic CHF

## 2014-01-13 NOTE — Patient Instructions (Signed)
You are doing well. No medication changes were made.  Please take extra torsemide as needed for ankle swelling, weight gain, shortness of breath  Please call us if you have new issues that need to be addressed before your next appt.  Your physician wants you to follow-up in: 6 months.  You will receive a reminder letter in the mail two months in advance. If you don't receive a letter, please call our office to schedule the follow-up appointment.

## 2014-04-10 ENCOUNTER — Ambulatory Visit: Payer: Self-pay | Admitting: Internal Medicine

## 2014-04-28 ENCOUNTER — Encounter: Payer: Self-pay | Admitting: Nurse Practitioner

## 2014-04-28 ENCOUNTER — Ambulatory Visit (INDEPENDENT_AMBULATORY_CARE_PROVIDER_SITE_OTHER): Payer: Medicare PPO | Admitting: Nurse Practitioner

## 2014-04-28 VITALS — BP 132/60 | HR 95 | Ht 64.0 in | Wt 179.2 lb

## 2014-04-28 DIAGNOSIS — R072 Precordial pain: Secondary | ICD-10-CM

## 2014-04-28 DIAGNOSIS — R0789 Other chest pain: Secondary | ICD-10-CM

## 2014-04-28 DIAGNOSIS — I2510 Atherosclerotic heart disease of native coronary artery without angina pectoris: Secondary | ICD-10-CM | POA: Insufficient documentation

## 2014-04-28 DIAGNOSIS — I5032 Chronic diastolic (congestive) heart failure: Secondary | ICD-10-CM

## 2014-04-28 DIAGNOSIS — I509 Heart failure, unspecified: Secondary | ICD-10-CM

## 2014-04-28 DIAGNOSIS — E785 Hyperlipidemia, unspecified: Secondary | ICD-10-CM

## 2014-04-28 DIAGNOSIS — I10 Essential (primary) hypertension: Secondary | ICD-10-CM

## 2014-04-28 NOTE — Patient Instructions (Addendum)
Your physician has requested that you have an echocardiogram. Echocardiography is a painless test that uses sound waves to create images of your heart. It provides your doctor with information about the size and shape of your heart and how well your heart's chambers and valves are working. This procedure takes approximately one hour. There are no restrictions for this procedure.  Your physician recommends that you schedule a follow-up appointment in: 1 month w/ Dr. Mariah MillingGollan  [facility]  Your caregiver has ordered a Stress Test with nuclear imaging. The purpose of this test is to evaluate the blood supply to your heart muscle. This procedure is referred to as a "Non-Invasive Stress Test." This is because other than having an IV started in your vein, nothing is inserted or "invades" your body. Cardiac stress tests are done to find areas of poor blood flow to the heart by determining the extent of coronary artery disease (CAD). Some patients exercise on a treadmill, which naturally increases the blood flow to your heart, while others who are  unable to walk on a treadmill due to physical limitations have a pharmacologic/chemical stress agent called Lexiscan . This medicine will mimic walking on a treadmill by temporarily increasing your coronary blood flow.   Please note: these test may take anywhere between 2-4 hours to complete  [facility]  THE VOLUNTEERS AT THE FIRST DESK WILL DIRECT YOU WHERE TO GO  Date of Procedure:______Wednesday, June 24_____________  Arrival Time for Procedure:_______10:15am________________  Instructions regarding medication:   __X__ : Hold diabetes medication morning of procedure  __X__:  Hold betablocker(s) night before procedure and morning of procedure: ATENOLOL  PLEASE NOTIFY THE OFFICE AT LEAST 24 HOURS IN ADVANCE IF YOU ARE UNABLE TO KEEP YOUR APPOINTMENT.  [phone] AND  PLEASE NOTIFY [facility] AT LEAST 24  HOURS IN ADVANCE IF YOU ARE UNABLE TO KEEP YOUR APPOINTMENT. [phone]  How to prepare for your My[facility] test:  1. Do not eat or drink after midnight 2. No caffeine for 24 hours prior to test 3. No smoking 24 hours prior to test. 4. Your medication may be taken with water.  If your doctor stopped a medication because of this test, do not take that medication. 5. Ladies, please do not wear dresses.  Skirts or pants are appropriate. Please wear a short sleeve shirt. 6. No perfume, cologne or lotion. 7. Wear comfortable walking shoes. No heels!

## 2014-04-28 NOTE — Progress Notes (Signed)
Patient Name: [name] [name] Date of Encounter: 04/28/2014  Primary Care Provider:  Audrea MuscatBURKETT, EVANNA CORI, NP Primary Cardiologist:  Concha Se. Gollan, MD   Patient Profile  80 y/o female with a h/o CAD, htn, hl, dm, and RLE swelling w/ h/o DVT on chronic anticoagulation who presents today with a 2 day h/o c/p.  Problem List   Past Medical History  Diagnosis Date  . Hypertension   . Diabetes mellitus   . Hyperlipidemia   . Hypothyroidism   . PVD (peripheral vascular disease)   . Coronary artery disease     a. s/p CABG in [place] - late 90's/early 2000's;  b. s/p stenting  . Liver cyst   . Blood clot of neck vein   . Chronic diastolic CHF (congestive heart failure)     a. December 01, 2011 shows normal systolic function, ejection fraction greater than 55%, mild LVH, mild TR, moderate right ventricular systolic pressures.  Marland Kitchen. DVT (deep venous thrombosis)     a. RLE - chronic xarelto.   Past Surgical History  Procedure Laterality Date  . Angioplasty      right leg  . Thyroidectomy    . Cardiac catheterization    . Coronary artery bypass graft  1999    Allergies  Allergies  Allergen Reactions  . Acetaminophen     Upset stomach  . Codeine     HPI  80 y/o female with the above problem list.  She was last seen in 2014.  She is fairly independent @ home where she lives with her grand-daughter.  She has chronic RLE swelling which has been worse recently.  She is due to have this wrapped by home health upon orders from her PCP.  She does have a h/o DVT in that leg and about a month ago was transitioned from coumadin to xarelto.  Her right foot and ankle have been hurting recently and she is currently on a medrol dose pack taper for gout flare.  2 days ago, after eating, she developed moderate chest tightness assoc with diaphoresis.  No other assoc Ss.  She took an atenolol and Ss resolved in about 30 mins.  Last night while in bed, she had recurrent Ss w/o assoc Ss.  She took an  atenolol and again Ss resolved within about 30 mins.  She has had no Ss today.  No exertional Ss to speak of, though with her gout flare, she has been up much.  She denies palpitations, dyspnea, pnd, orthopnea, n, v, dizziness, syncope, weight gain, or early satiety.   Home Medications  Prior to Admission medications   Medication Sig Start Date End Date Taking? Authorizing Provider  allopurinol (ZYLOPRIM) 100 MG tablet Take 200 mg by mouth as needed.    Yes Historical Provider, MD  amLODipine (NORVASC) 5 MG tablet Take 5 mg by mouth daily.   Yes Historical Provider, MD  aspirin 81 MG tablet Take 81 mg by mouth daily.   Yes Historical Provider, MD  atenolol (TENORMIN) 100 MG tablet Take 1 tablet (100 mg total) by mouth daily. 01/04/14  Yes Antonieta Ibaimothy J Gollan, MD  GINKGO BILOBA COMPLEX PO Take 120 mg by mouth daily.    Yes Historical Provider, MD  isosorbide mononitrate (IMDUR) 30 MG 24 hr tablet TAKE ONE TABLET BY MOUTH EVERY DAY 01/02/14  Yes Antonieta Ibaimothy J Gollan, MD  LEVEMIR 100 UNIT/ML injection Takes 75 units am and 45 units pm daily. 02/21/13  Yes Historical Provider, MD  levothyroxine (SYNTHROID, LEVOTHROID)  125 MCG tablet Take 125 mcg by mouth daily before breakfast.   Yes Historical Provider, MD  losartan (COZAAR) 50 MG tablet Take 50 mg by mouth daily.   Yes Historical Provider, MD  nystatin cream (MYCOSTATIN) Apply 1 application topically as needed.    Yes Historical Provider, MD  [facility] DELICA LANCETS 33G MISC daily.  12/24/12  Yes Historical Provider, MD  Letta PateNETOUCH VERIO test strip daily.  02/14/13  Yes Historical Provider, MD  potassium chloride (KLOR-CON M10) 10 MEQ tablet TAKE ONE TABLET BY MOUTH TWICE DAILY WITH LASIX 12/22/13  Yes Antonieta Ibaimothy J Gollan, MD  Pregabalin (LYRICA PO) Take 500 mg by mouth 3 (three) times daily.   Yes Historical Provider, MD  rivaroxaban (XARELTO) 20 MG TABS tablet Take 20 mg by mouth daily with supper.   Yes Historical Provider, MD  simvastatin (ZOCOR) 40 MG tablet  Take 1 tablet (40 mg total) by mouth daily. 12/30/13  Yes Antonieta Ibaimothy J Gollan, MD  torsemide (DEMADEX) 20 MG tablet Take 2 tablets (40 mg total) by mouth 2 (two) times daily. 01/02/14 01/02/15 Yes Antonieta Ibaimothy J Gollan, MD  VICTOZA 18 MG/3ML SOPN daily.  06/02/13  Yes Historical Provider, MD    Review of Systems  Chest pain as above.  Chronic rle edema.  Gout pain - right foot and ankle.  All other systems reviewed and are otherwise negative except as noted above.  Physical Exam  Blood pressure 132/60, pulse 95, height 5\' 4"  (1.626 m), weight 179 lb 4 oz (81.307 kg).  General: Pleasant, NAD Psych: Normal affect. Neuro: Alert and oriented X 3. Moves all extremities spontaneously. HEENT: Normal  Neck: Supple without bruits or JVD. Lungs:  Resp regular and unlabored, CTA. Heart: RRR no s3, s4, or murmurs. Abdomen: Soft, non-tender, non-distended, BS + x 4.  Extremities: No clubbing, cyanosis.  3+ right LE edema. No edema on left.  PT/Radials 1+ and equal bilaterally.  Accessory Clinical Findings  ECG - rsr, 95, poor r progression.  No acute st/t changes.  Assessment & Plan  1.  Midsternal chest pain:  Pt presents after two, 30 min episodes of c/p at rest.  Both resolved spontaneously (after taking atenolol).  No exertional Ss.  She does not remember anginal equivalent.  Will order lexiscan MV to r/o ischemia.  Cont current meds.  2.  Chronic R LEE w/ h/o DVT:  Awaiting for this to be wrapped by home health.  3+ edema on exam.  Has been on chronic anticoagulation and was transitioned to xarelto last month.  3.  HTN:  Stable.  4.  Chronic diastolic CHF:  Wt apparently stable.  HR/BP stable.  5.  HL:  On statin.  6.  Dispo:  F/u 2-4 wks pending nuc study.  [name] Duckinghristopher Berge, NP 04/28/2014, 5:37 PM

## 2014-04-29 ENCOUNTER — Inpatient Hospital Stay: Payer: Self-pay | Admitting: Internal Medicine

## 2014-04-29 LAB — COMPREHENSIVE METABOLIC PANEL
ALK PHOS: 118 U/L — AB
Albumin: 2.7 g/dL — ABNORMAL LOW (ref 3.4–5.0)
Anion Gap: 7 (ref 7–16)
BUN: 26 mg/dL — AB (ref 7–18)
Bilirubin,Total: 1.6 mg/dL — ABNORMAL HIGH (ref 0.2–1.0)
CALCIUM: 9.6 mg/dL (ref 8.5–10.1)
Chloride: 103 mmol/L (ref 98–107)
Co2: 27 mmol/L (ref 21–32)
Creatinine: 1.29 mg/dL (ref 0.60–1.30)
EGFR (African American): 43 — ABNORMAL LOW
GFR CALC NON AF AMER: 37 — AB
GLUCOSE: 94 mg/dL (ref 65–99)
Osmolality: 278 (ref 275–301)
Potassium: 4.1 mmol/L (ref 3.5–5.1)
SGOT(AST): 28 U/L (ref 15–37)
SGPT (ALT): 35 U/L (ref 12–78)
SODIUM: 137 mmol/L (ref 136–145)
Total Protein: 7.6 g/dL (ref 6.4–8.2)

## 2014-04-29 LAB — URINALYSIS, COMPLETE
Bilirubin,UR: NEGATIVE
GLUCOSE, UR: NEGATIVE mg/dL (ref 0–75)
Ketone: NEGATIVE
Nitrite: POSITIVE
Ph: 6 (ref 4.5–8.0)
Protein: NEGATIVE
RBC,UR: 1 /HPF (ref 0–5)
SQUAMOUS EPITHELIAL: NONE SEEN
Specific Gravity: 1.011 (ref 1.003–1.030)

## 2014-04-29 LAB — CBC WITH DIFFERENTIAL/PLATELET
BASOS ABS: 0.1 10*3/uL (ref 0.0–0.1)
Basophil %: 0.5 %
Eosinophil #: 0 10*3/uL (ref 0.0–0.7)
Eosinophil %: 0 %
HCT: 40.2 % (ref 35.0–47.0)
HGB: 13.4 g/dL (ref 12.0–16.0)
LYMPHS ABS: 1.1 10*3/uL (ref 1.0–3.6)
Lymphocyte %: 6.3 %
MCH: 31.6 pg (ref 26.0–34.0)
MCHC: 33.4 g/dL (ref 32.0–36.0)
MCV: 95 fL (ref 80–100)
MONO ABS: 0.7 x10 3/mm (ref 0.2–0.9)
Monocyte %: 4.3 %
Neutrophil #: 15.2 10*3/uL — ABNORMAL HIGH (ref 1.4–6.5)
Neutrophil %: 88.9 %
Platelet: 244 10*3/uL (ref 150–440)
RBC: 4.25 10*6/uL (ref 3.80–5.20)
RDW: 14 % (ref 11.5–14.5)
WBC: 17.2 10*3/uL — AB (ref 3.6–11.0)

## 2014-04-29 LAB — CK-MB

## 2014-04-29 LAB — MAGNESIUM: MAGNESIUM: 1.5 mg/dL — AB

## 2014-04-29 LAB — PROTIME-INR
INR: 1.2
Prothrombin Time: 14.7 secs (ref 11.5–14.7)

## 2014-04-29 LAB — PHOSPHORUS: PHOSPHORUS: 2.9 mg/dL (ref 2.5–4.9)

## 2014-04-29 LAB — APTT: Activated PTT: 23.5 secs — ABNORMAL LOW (ref 23.6–35.9)

## 2014-04-29 LAB — TROPONIN I: Troponin-I: 0.2 ng/mL — ABNORMAL HIGH

## 2014-04-30 DIAGNOSIS — R079 Chest pain, unspecified: Secondary | ICD-10-CM

## 2014-04-30 DIAGNOSIS — A419 Sepsis, unspecified organism: Secondary | ICD-10-CM

## 2014-04-30 DIAGNOSIS — I369 Nonrheumatic tricuspid valve disorder, unspecified: Secondary | ICD-10-CM

## 2014-04-30 DIAGNOSIS — I214 Non-ST elevation (NSTEMI) myocardial infarction: Secondary | ICD-10-CM

## 2014-04-30 LAB — CBC WITH DIFFERENTIAL/PLATELET
BASOS ABS: 0.1 10*3/uL (ref 0.0–0.1)
BASOS PCT: 0.7 %
Eosinophil #: 0 10*3/uL (ref 0.0–0.7)
Eosinophil %: 0 %
HCT: 34.1 % — ABNORMAL LOW (ref 35.0–47.0)
HGB: 11 g/dL — ABNORMAL LOW (ref 12.0–16.0)
LYMPHS ABS: 0.9 10*3/uL — AB (ref 1.0–3.6)
LYMPHS PCT: 9.6 %
MCH: 30.9 pg (ref 26.0–34.0)
MCHC: 32.4 g/dL (ref 32.0–36.0)
MCV: 96 fL (ref 80–100)
Monocyte #: 0.4 x10 3/mm (ref 0.2–0.9)
Monocyte %: 4 %
Neutrophil #: 8 10*3/uL — ABNORMAL HIGH (ref 1.4–6.5)
Neutrophil %: 85.7 %
PLATELETS: 177 10*3/uL (ref 150–440)
RBC: 3.57 10*6/uL — AB (ref 3.80–5.20)
RDW: 13.8 % (ref 11.5–14.5)
WBC: 9.4 10*3/uL (ref 3.6–11.0)

## 2014-04-30 LAB — HEMOGLOBIN A1C: Hemoglobin A1C: 7.8 % — ABNORMAL HIGH (ref 4.2–6.3)

## 2014-04-30 LAB — BASIC METABOLIC PANEL
Anion Gap: 11 (ref 7–16)
BUN: 21 mg/dL — ABNORMAL HIGH (ref 7–18)
Calcium, Total: 7.8 mg/dL — ABNORMAL LOW (ref 8.5–10.1)
Chloride: 110 mmol/L — ABNORMAL HIGH (ref 98–107)
Co2: 23 mmol/L (ref 21–32)
Creatinine: 1.2 mg/dL (ref 0.60–1.30)
EGFR (African American): 47 — ABNORMAL LOW
GFR CALC NON AF AMER: 41 — AB
Glucose: 73 mg/dL (ref 65–99)
Osmolality: 288 (ref 275–301)
Potassium: 3.9 mmol/L (ref 3.5–5.1)
Sodium: 144 mmol/L (ref 136–145)

## 2014-04-30 LAB — CK TOTAL AND CKMB (NOT AT ARMC)
CK, TOTAL: 34 U/L
CK, Total: 102 U/L
CK-MB: 0.8 ng/mL (ref 0.5–3.6)
CK-MB: 0.9 ng/mL (ref 0.5–3.6)

## 2014-04-30 LAB — TROPONIN I
Troponin-I: 0.15 ng/mL — ABNORMAL HIGH
Troponin-I: 0.17 ng/mL — ABNORMAL HIGH

## 2014-05-01 DIAGNOSIS — I824Z9 Acute embolism and thrombosis of unspecified deep veins of unspecified distal lower extremity: Secondary | ICD-10-CM

## 2014-05-01 LAB — CREATININE, SERUM
Creatinine: 1.4 mg/dL — ABNORMAL HIGH (ref 0.60–1.30)
GFR CALC AF AMER: 39 — AB
GFR CALC NON AF AMER: 34 — AB

## 2014-05-01 LAB — URINE CULTURE

## 2014-05-02 LAB — BASIC METABOLIC PANEL
ANION GAP: 5 — AB (ref 7–16)
BUN: 27 mg/dL — AB (ref 7–18)
Calcium, Total: 9 mg/dL (ref 8.5–10.1)
Chloride: 105 mmol/L (ref 98–107)
Co2: 27 mmol/L (ref 21–32)
Creatinine: 1.5 mg/dL — ABNORMAL HIGH (ref 0.60–1.30)
GFR CALC AF AMER: 36 — AB
GFR CALC NON AF AMER: 31 — AB
Glucose: 172 mg/dL — ABNORMAL HIGH (ref 65–99)
Osmolality: 283 (ref 275–301)
Potassium: 3.8 mmol/L (ref 3.5–5.1)
Sodium: 137 mmol/L (ref 136–145)

## 2014-05-03 LAB — CBC WITH DIFFERENTIAL/PLATELET
BASOS ABS: 0.1 10*3/uL (ref 0.0–0.1)
Basophil %: 0.4 %
EOS PCT: 2.3 %
Eosinophil #: 0.3 10*3/uL (ref 0.0–0.7)
HCT: 29.9 % — ABNORMAL LOW (ref 35.0–47.0)
HGB: 10.1 g/dL — ABNORMAL LOW (ref 12.0–16.0)
LYMPHS ABS: 1.3 10*3/uL (ref 1.0–3.6)
LYMPHS PCT: 10.1 %
MCH: 32.3 pg (ref 26.0–34.0)
MCHC: 33.9 g/dL (ref 32.0–36.0)
MCV: 95 fL (ref 80–100)
Monocyte #: 0.7 x10 3/mm (ref 0.2–0.9)
Monocyte %: 5.6 %
NEUTROS PCT: 81.6 %
Neutrophil #: 10.7 10*3/uL — ABNORMAL HIGH (ref 1.4–6.5)
Platelet: 214 10*3/uL (ref 150–440)
RBC: 3.14 10*6/uL — ABNORMAL LOW (ref 3.80–5.20)
RDW: 13.7 % (ref 11.5–14.5)
WBC: 13 10*3/uL — ABNORMAL HIGH (ref 3.6–11.0)

## 2014-05-03 LAB — CREATININE, SERUM
Creatinine: 1.26 mg/dL (ref 0.60–1.30)
EGFR (African American): 45 — ABNORMAL LOW
EGFR (Non-African Amer.): 39 — ABNORMAL LOW

## 2014-05-04 LAB — CBC WITH DIFFERENTIAL/PLATELET
BASOS ABS: 0 10*3/uL (ref 0.0–0.1)
Basophil %: 0.3 %
Eosinophil #: 0.2 10*3/uL (ref 0.0–0.7)
Eosinophil %: 1.1 %
HCT: 29.4 % — ABNORMAL LOW (ref 35.0–47.0)
HGB: 9.6 g/dL — ABNORMAL LOW (ref 12.0–16.0)
LYMPHS PCT: 8.4 %
Lymphocyte #: 1.1 10*3/uL (ref 1.0–3.6)
MCH: 31.1 pg (ref 26.0–34.0)
MCHC: 32.7 g/dL (ref 32.0–36.0)
MCV: 95 fL (ref 80–100)
MONOS PCT: 7.4 %
Monocyte #: 1 x10 3/mm — ABNORMAL HIGH (ref 0.2–0.9)
NEUTROS PCT: 82.8 %
Neutrophil #: 11.1 10*3/uL — ABNORMAL HIGH (ref 1.4–6.5)
Platelet: 231 10*3/uL (ref 150–440)
RBC: 3.09 10*6/uL — ABNORMAL LOW (ref 3.80–5.20)
RDW: 13.8 % (ref 11.5–14.5)
WBC: 13.4 10*3/uL — ABNORMAL HIGH (ref 3.6–11.0)

## 2014-05-04 LAB — COMPREHENSIVE METABOLIC PANEL
ALBUMIN: 1.5 g/dL — AB (ref 3.4–5.0)
ALT: 21 U/L (ref 12–78)
AST: 20 U/L (ref 15–37)
Alkaline Phosphatase: 201 U/L — ABNORMAL HIGH
Anion Gap: 6 — ABNORMAL LOW (ref 7–16)
BUN: 19 mg/dL — AB (ref 7–18)
Bilirubin,Total: 2.6 mg/dL — ABNORMAL HIGH (ref 0.2–1.0)
Calcium, Total: 9 mg/dL (ref 8.5–10.1)
Chloride: 109 mmol/L — ABNORMAL HIGH (ref 98–107)
Co2: 25 mmol/L (ref 21–32)
Creatinine: 1.18 mg/dL (ref 0.60–1.30)
EGFR (African American): 48 — ABNORMAL LOW
GFR CALC NON AF AMER: 42 — AB
Glucose: 113 mg/dL — ABNORMAL HIGH (ref 65–99)
OSMOLALITY: 282 (ref 275–301)
Potassium: 4.1 mmol/L (ref 3.5–5.1)
SODIUM: 140 mmol/L (ref 136–145)
Total Protein: 5.7 g/dL — ABNORMAL LOW (ref 6.4–8.2)

## 2014-05-04 LAB — CULTURE, BLOOD (SINGLE)

## 2014-05-05 LAB — CBC WITH DIFFERENTIAL/PLATELET
Basophil #: 0 10*3/uL (ref 0.0–0.1)
Basophil %: 0.2 %
EOS PCT: 1 %
Eosinophil #: 0.1 10*3/uL (ref 0.0–0.7)
HCT: 27 % — ABNORMAL LOW (ref 35.0–47.0)
HGB: 8.9 g/dL — ABNORMAL LOW (ref 12.0–16.0)
LYMPHS PCT: 9.4 %
Lymphocyte #: 1.3 10*3/uL (ref 1.0–3.6)
MCH: 31 pg (ref 26.0–34.0)
MCHC: 32.9 g/dL (ref 32.0–36.0)
MCV: 94 fL (ref 80–100)
MONO ABS: 1.1 x10 3/mm — AB (ref 0.2–0.9)
Monocyte %: 8 %
NEUTROS PCT: 81.4 %
Neutrophil #: 11.5 10*3/uL — ABNORMAL HIGH (ref 1.4–6.5)
PLATELETS: 273 10*3/uL (ref 150–440)
RBC: 2.87 10*6/uL — ABNORMAL LOW (ref 3.80–5.20)
RDW: 13.9 % (ref 11.5–14.5)
WBC: 14.2 10*3/uL — ABNORMAL HIGH (ref 3.6–11.0)

## 2014-05-05 LAB — COMPREHENSIVE METABOLIC PANEL
ALK PHOS: 222 U/L — AB
Albumin: 1.4 g/dL — ABNORMAL LOW (ref 3.4–5.0)
Anion Gap: 9 (ref 7–16)
BUN: 19 mg/dL — ABNORMAL HIGH (ref 7–18)
Bilirubin,Total: 1.9 mg/dL — ABNORMAL HIGH (ref 0.2–1.0)
CALCIUM: 8.8 mg/dL (ref 8.5–10.1)
Chloride: 109 mmol/L — ABNORMAL HIGH (ref 98–107)
Co2: 21 mmol/L (ref 21–32)
Creatinine: 1.41 mg/dL — ABNORMAL HIGH (ref 0.60–1.30)
EGFR (African American): 39 — ABNORMAL LOW
EGFR (Non-African Amer.): 34 — ABNORMAL LOW
Glucose: 84 mg/dL (ref 65–99)
OSMOLALITY: 279 (ref 275–301)
Potassium: 3.9 mmol/L (ref 3.5–5.1)
SGOT(AST): 17 U/L (ref 15–37)
SGPT (ALT): 19 U/L (ref 12–78)
Sodium: 139 mmol/L (ref 136–145)
Total Protein: 5.8 g/dL — ABNORMAL LOW (ref 6.4–8.2)

## 2014-05-05 LAB — MAGNESIUM: Magnesium: 2 mg/dL

## 2014-05-08 ENCOUNTER — Non-Acute Institutional Stay (SKILLED_NURSING_FACILITY): Payer: Medicare PPO | Admitting: Adult Health

## 2014-05-08 ENCOUNTER — Encounter: Payer: Self-pay | Admitting: Adult Health

## 2014-05-08 DIAGNOSIS — I10 Essential (primary) hypertension: Secondary | ICD-10-CM

## 2014-05-08 DIAGNOSIS — I509 Heart failure, unspecified: Secondary | ICD-10-CM

## 2014-05-08 DIAGNOSIS — I739 Peripheral vascular disease, unspecified: Secondary | ICD-10-CM

## 2014-05-08 DIAGNOSIS — M1A9XX0 Chronic gout, unspecified, without tophus (tophi): Secondary | ICD-10-CM

## 2014-05-08 DIAGNOSIS — K5900 Constipation, unspecified: Secondary | ICD-10-CM

## 2014-05-08 DIAGNOSIS — E039 Hypothyroidism, unspecified: Secondary | ICD-10-CM

## 2014-05-08 DIAGNOSIS — I5032 Chronic diastolic (congestive) heart failure: Secondary | ICD-10-CM

## 2014-05-08 DIAGNOSIS — E1159 Type 2 diabetes mellitus with other circulatory complications: Secondary | ICD-10-CM

## 2014-05-08 DIAGNOSIS — M1A00X0 Idiopathic chronic gout, unspecified site, without tophus (tophi): Secondary | ICD-10-CM

## 2014-05-08 DIAGNOSIS — M1A40X0 Other secondary chronic gout, unspecified site, without tophus (tophi): Secondary | ICD-10-CM

## 2014-05-08 DIAGNOSIS — E785 Hyperlipidemia, unspecified: Secondary | ICD-10-CM

## 2014-05-08 DIAGNOSIS — I2510 Atherosclerotic heart disease of native coronary artery without angina pectoris: Secondary | ICD-10-CM

## 2014-05-08 NOTE — Progress Notes (Signed)
Patient ID: [name] [name], female   DOB: 1924-12-18, 80 y.o.   MRN: [id number]     [facility]  Allergies  Allergen Reactions  . Acetaminophen     Upset stomach  . Codeine      Chief Complaint  Patient presents with  . Hospitalization Follow-up    HPI:  She has been hospitalized for right lower leg cellulitis and uti. She has severe protein calorie malnutrition as well. She is here for short term rehab with her goal to return back home.  I have spoken with her family regarding her medication regimen; at this time; she more than likely does not need victoza; zocor; fish oil; and humalog. Her family is in agreement with these medication changes.     Past Medical History  Diagnosis Date  . Hypertension   . Diabetes mellitus   . Hyperlipidemia   . Hypothyroidism   . PVD (peripheral vascular disease)   . Coronary artery disease     a. s/p CABG in [place] - late 90's/early 2000's;  b. s/p stenting  . Liver cyst   . Blood clot of neck vein   . Chronic diastolic CHF (congestive heart failure)     a. December 01, 2011 shows normal systolic function, ejection fraction greater than 55%, mild LVH, mild TR, moderate right ventricular systolic pressures.  Marland Kitchen DVT (deep venous thrombosis)     a. RLE - chronic xarelto.    Past Surgical History  Procedure Laterality Date  . Angioplasty      right leg  . Thyroidectomy    . Cardiac catheterization    . Coronary artery bypass graft  1999    VITAL SIGNS BP 136/62  Pulse 95  Ht '5\' 4"'  (1.626 m)  Wt 184 lb 8 oz (83.689 kg)  BMI 31.65 kg/m2  SpO2 97%   Patient's Medications  New Prescriptions   No medications on file  Previous Medications   ALBUTEROL (PROVENTIL) (2.5 MG/3ML) 0.083% NEBULIZER SOLUTION    Take 2.5 mg by nebulization every 6 (six) hours as needed for wheezing or shortness of breath.   ALLOPURINOL (ZYLOPRIM) 100 MG TABLET    Take 200 mg by mouth daily.    AMLODIPINE (NORVASC) 5 MG TABLET    Take 5 mg by mouth daily.    ASPIRIN 81 MG TABLET    Take 81 mg by mouth daily.   ATENOLOL (TENORMIN) 100 MG TABLET    Take 1 tablet (100 mg total) by mouth daily.   DOCUSATE SODIUM (COLACE) 100 MG CAPSULE    Take 100 mg by mouth 2 (two) times daily.   GABAPENTIN (NEURONTIN) 300 MG CAPSULE    Take 300 mg by mouth 3 (three) times daily.   GINKGO BILOBA COMPLEX PO    Take 120 mg by mouth daily.    HYDROCODONE-ACETAMINOPHEN (NORCO/VICODIN) 5-325 MG PER TABLET    Take 1 tablet by mouth every 6 (six) hours as needed for moderate pain.   INSULIN LISPRO (HUMALOG) 100 UNIT/ML INJECTION    Inject 5 Units into the skin 3 (three) times daily before meals. For cbg >=150   ISOSORBIDE MONONITRATE (IMDUR) 30 MG 24 HR TABLET    TAKE ONE TABLET BY MOUTH EVERY DAY   LEVEMIR 100 UNIT/ML INJECTION    Takes 75 units am and 45 units pm daily.   LEVOTHYROXINE (SYNTHROID, LEVOTHROID) 125 MCG TABLET    Take 125 mcg by mouth daily before breakfast.   LOSARTAN (COZAAR) 50 MG TABLET  Take 50 mg by mouth daily.   OMEGA-3 FATTY ACIDS (FISH OIL) 1000 MG CAPS    Take 1,000 mg by mouth daily.   ONETOUCH DELICA LANCETS 50T MISC    daily.    ONETOUCH VERIO TEST STRIP    daily.    RIVAROXABAN (XARELTO) 20 MG TABS TABLET    Take 20 mg by mouth daily with supper.   SIMVASTATIN (ZOCOR) 40 MG TABLET    Take 1 tablet (40 mg total) by mouth daily.   TORSEMIDE (DEMADEX) 20 MG TABLET    Take 2 tablets (40 mg total) by mouth 2 (two) times daily.   VICTOZA 18 MG/3ML SOPN    Inject 1.8 mg into the skin daily.   Modified Medications   Modified Medication Previous Medication   POTASSIUM CHLORIDE (K-DUR,KLOR-CON) 10 MEQ TABLET potassium chloride (KLOR-CON M10) 10 MEQ tablet      Take 10 mEq by mouth daily.    TAKE ONE TABLET BY MOUTH TWICE DAILY WITH LASIX  Discontinued Medications   NYSTATIN CREAM (MYCOSTATIN)    Apply 1 application topically as needed.    PREGABALIN (LYRICA PO)    Take 500 mg by mouth 3 (three) times daily.    SIGNIFICANT DIAGNOSTIC  EXAMS  04-29-14: chest x-ray: cardiomegaly without acute findings. Lung bases are poorly evaluated   04-29-14: EKG: sinus tachycardia with pac's   04-30-14: right lower extremity doppler: no evidence of dvt   04-30-14: abdominal ultrasound: no evidence of cholecystitis. Benign appearing right renal cyst   04-30-14: 2-d echo: ef 55-60%; normal global left ventricular systolic function   8-88-28: EKG: nsr  05-04-14: chest x-ray; probable chf with cardiomegaly; and pulmonary vascular congestion. Cannot exclude small effusions.     LABS REVIEWED:   04-29-14: wbc 17.2; hgb 13.4; hct 40.2; mcv 95; plt 244; glucose 94; bun 26; creat 1.29; k+4.1; na++137 alk phos 118; ast 28; alt 35; t bili 1.6; albumin 2.7 phos 2.9  Blood culture: no growth  Urine culture: e-coli  hgb a1c 7.8  04-30-14: glucose 73; bun 21; creat 1.20; k+3.9; na++144 05-02-14: glucose 172; bun 27; creat 1.50; k+ 3.8; na++137 05-03-14: wbc 13.0; hgb 10.1; hct 29.9 ;mcv 95 ;plt 214  05-04-14: wbc 13.4; hgb 9.6; hct 29.4; mcv 95; plt 231; glucose 113; bun 19; creat 1.18; k+4.1; na++140; alk phos 201; ast 20; alt 21; t bili 2.6; albumin 1.5  05-05-14: wbc 14.2; hgb 8.9; hct 27.0; mcv 94; plt 273 glucose 84; bun 19; creat 1.41; k+3.9; na++139; alk phos 222; ast 17; alt 19; t bili 1.9; albumin 1.4 mag 2.0     Review of Systems  Constitutional: Negative for malaise/fatigue.  Respiratory: Negative for cough and shortness of breath.   Cardiovascular: Negative for chest pain and palpitations.  Gastrointestinal: Positive for constipation. Negative for heartburn and abdominal pain.  Musculoskeletal: Negative for joint pain and myalgias.  Skin: Negative.   Psychiatric/Behavioral: The patient is not nervous/anxious.     Physical Exam  Constitutional: She appears well-developed and well-nourished. No distress.  Neck: Neck supple. No JVD present.  Cardiovascular: Normal rate, regular rhythm and intact distal pulses.   Respiratory: Effort  normal and breath sounds normal. No respiratory distress. She has no wheezes.  GI: Soft. Bowel sounds are normal. She exhibits no distension. There is no tenderness.  Musculoskeletal: She exhibits no edema.  Is able to move extremities.    Neurological: She is alert.  Skin: Skin is warm and dry. She is not diaphoretic.  ASSESSMENT/ PLAN:  1. Cellulitis: will complete her doxycycline 10 mg twice daily for a total of 10 days and will continue to monitor her status   2. Diabetes: is presently stable will stop the victoza and humalog and will continue levemir 75 units at night and 45 units in the am and will continue to monitor her status. Her hgb a1c is 7.8   3. Dyslipidemia: will stop the zocor and fish oil at this time; will monitor  4. Protein calorie malnutrition: will begin prostat 30 cc three times daily her albumin is 1.4. Her zocor was stopped in light of her poor nutritional status; will monitor   5. Constipation: will change her colace to senna s 2 tabs daily   6. Gout: no recent flares present will continue allopurinol 200 mg daily   7. Hypothyroidism: will continue synthroid 125 mcg daily  8. CAD: no complaints of chest pain present: will continue asa 81 mg daily; imdur 30 mg daily   9. Hypertension; stable will continue norvasc 5 mg daily; cozaar 50 mg daily; tenormin 100 mg daily and will monitor  10. Diastolic heart failure: will continue demadex 40 mg twice daily will monitor   11. PVD: no complaints of pain present; will continue neurontin 300 mg three times daily   12. Right lower extremity dvt: is on chronic xarelto 20 mg daily will monitor   Time spent with patient 50 minutes      Ok Edwards NP [facility]  Contact [phone] Monday through Friday 8am- 5pm  After hours call [phone]

## 2014-05-09 ENCOUNTER — Non-Acute Institutional Stay (SKILLED_NURSING_FACILITY): Payer: Medicare PPO | Admitting: Adult Health

## 2014-05-09 DIAGNOSIS — M1A4320 Other secondary chronic gout, left wrist, without tophus (tophi): Secondary | ICD-10-CM

## 2014-05-09 DIAGNOSIS — M1A00X0 Idiopathic chronic gout, unspecified site, without tophus (tophi): Secondary | ICD-10-CM

## 2014-05-10 ENCOUNTER — Ambulatory Visit: Payer: Self-pay | Admitting: Internal Medicine

## 2014-05-11 ENCOUNTER — Other Ambulatory Visit: Payer: Medicare PPO

## 2014-05-11 DIAGNOSIS — R0989 Other specified symptoms and signs involving the circulatory and respiratory systems: Secondary | ICD-10-CM

## 2014-05-14 ENCOUNTER — Encounter: Payer: Self-pay | Admitting: Adult Health

## 2014-05-14 DIAGNOSIS — E039 Hypothyroidism, unspecified: Secondary | ICD-10-CM | POA: Insufficient documentation

## 2014-05-14 DIAGNOSIS — M1A9XX0 Chronic gout, unspecified, without tophus (tophi): Secondary | ICD-10-CM | POA: Insufficient documentation

## 2014-05-14 DIAGNOSIS — E118 Type 2 diabetes mellitus with unspecified complications: Secondary | ICD-10-CM | POA: Insufficient documentation

## 2014-05-14 DIAGNOSIS — K5900 Constipation, unspecified: Secondary | ICD-10-CM | POA: Insufficient documentation

## 2014-05-14 MED ORDER — SENNOSIDES-DOCUSATE SODIUM 8.6-50 MG PO TABS: 2.0000 | ORAL_TABLET | Freq: Every day | ORAL | Status: DC

## 2014-05-14 NOTE — Progress Notes (Signed)
Patient ID: [name] [name], female   DOB: 1925-09-01, 80 y.o.   MRN: [id number]     [facility]  Allergies  Allergen Reactions  . Acetaminophen     Upset stomach  . Codeine      Chief Complaint  Patient presents with  . Acute Visit    left wrist pain     HPI:  She is complaining of left wrist pain. She is unable to move bear weight on her left wrist. She states that she has had this pain for the past several days; but denied pain to me yesterday. Her left wrist is red warm with slight swelling present. She does have history of gout present. She denies any recent injury or trauma.    Past Medical History  Diagnosis Date  . Hypertension   . Diabetes mellitus   . Hyperlipidemia   . Hypothyroidism   . PVD (peripheral vascular disease)   . Coronary artery disease     a. s/p CABG in [place] - late 90's/early 2000's;  b. s/p stenting  . Liver cyst   . Blood clot of neck vein   . Chronic diastolic CHF (congestive heart failure)     a. December 01, 2011 shows normal systolic function, ejection fraction greater than 55%, mild LVH, mild TR, moderate right ventricular systolic pressures.  Marland Kitchen DVT (deep venous thrombosis)     a. RLE - chronic xarelto.    Past Surgical History  Procedure Laterality Date  . Angioplasty      right leg  . Thyroidectomy    . Cardiac catheterization    . Coronary artery bypass graft  1999    VITAL SIGNS BP 139/75  Pulse 83  Ht '5\' 4"'  (1.626 m)  Wt 184 lb 8 oz (83.689 kg)  BMI 31.65 kg/m2   Patient's Medications  New Prescriptions   No medications on file  Previous Medications   ALBUTEROL (PROVENTIL) (2.5 MG/3ML) 0.083% NEBULIZER SOLUTION    Take 2.5 mg by nebulization every 6 (six) hours as needed for wheezing or shortness of breath.   ALLOPURINOL (ZYLOPRIM) 100 MG TABLET    Take 200 mg by mouth daily.    AMLODIPINE (NORVASC) 5 MG TABLET    Take 5 mg by mouth daily.   ASPIRIN 81 MG TABLET    Take 81 mg by mouth daily.   ATENOLOL (TENORMIN)  100 MG TABLET    Take 1 tablet (100 mg total) by mouth daily.   GABAPENTIN (NEURONTIN) 300 MG CAPSULE    Take 300 mg by mouth 3 (three) times daily.   HYDROCODONE-ACETAMINOPHEN (NORCO/VICODIN) 5-325 MG PER TABLET    Take 1 tablet by mouth every 6 (six) hours as needed for moderate pain.   ISOSORBIDE MONONITRATE (IMDUR) 30 MG 24 HR TABLET    TAKE ONE TABLET BY MOUTH EVERY DAY   LEVEMIR 100 UNIT/ML INJECTION    Takes 75 units am and 45 units pm daily.   LEVOTHYROXINE (SYNTHROID, LEVOTHROID) 125 MCG TABLET    Take 125 mcg by mouth daily before breakfast.   LOSARTAN (COZAAR) 50 MG TABLET    Take 50 mg by mouth daily.   ONETOUCH DELICA LANCETS 44Y MISC    daily.    ONETOUCH VERIO TEST STRIP    daily.    POTASSIUM CHLORIDE (K-DUR,KLOR-CON) 10 MEQ TABLET    Take 10 mEq by mouth daily.   RIVAROXABAN (XARELTO) 20 MG TABS TABLET    Take 20 mg by mouth daily with supper.  SENNA-DOCUSATE (SENOKOT-S) 8.6-50 MG PER TABLET    Take 2 tablets by mouth daily.   TORSEMIDE (DEMADEX) 20 MG TABLET    Take 2 tablets (40 mg total) by mouth 2 (two) times daily.  Modified Medications   No medications on file  Discontinued Medications   No medications on file    SIGNIFICANT DIAGNOSTIC EXAMS   04-29-14: chest x-ray: cardiomegaly without acute findings. Lung bases are poorly evaluated   04-29-14: EKG: sinus tachycardia with pac's   04-30-14: right lower extremity doppler: no evidence of dvt   04-30-14: abdominal ultrasound: no evidence of cholecystitis. Benign appearing right renal cyst   04-30-14: 2-d echo: ef 55-60%; normal global left ventricular systolic function   05-03-45: EKG: nsr  05-04-14: chest x-ray; probable chf with cardiomegaly; and pulmonary vascular congestion. Cannot exclude small effusions.     LABS REVIEWED:   04-29-14: wbc 17.2; hgb 13.4; hct 40.2; mcv 95; plt 244; glucose 94; bun 26; creat 1.29; k+4.1; na++137 alk phos 118; ast 28; alt 35; t bili 1.6; albumin 2.7 phos 2.9  Blood culture: no  growth  Urine culture: e-coli  hgb a1c 7.8  04-30-14: glucose 73; bun 21; creat 1.20; k+3.9; na++144 05-02-14: glucose 172; bun 27; creat 1.50; k+ 3.8; na++137 05-03-14: wbc 13.0; hgb 10.1; hct 29.9 ;mcv 95 ;plt 214  05-04-14: wbc 13.4; hgb 9.6; hct 29.4; mcv 95; plt 231; glucose 113; bun 19; creat 1.18; k+4.1; na++140; alk phos 201; ast 20; alt 21; t bili 2.6; albumin 1.5  05-05-14: wbc 14.2; hgb 8.9; hct 27.0; mcv 94; plt 273 glucose 84; bun 19; creat 1.41; k+3.9; na++139; alk phos 222; ast 17; alt 19; t bili 1.9; albumin 1.4 mag 2.0     Review of Systems  Constitutional: Negative for malaise/fatigue.  Respiratory: Negative for cough and shortness of breath.   Cardiovascular: Negative for chest pain and palpitations.  Gastrointestinal: . Negative for heartburn and abdominal pain.  Musculoskeletal: left wrist pain   Skin: Negative.   Psychiatric/Behavioral: The patient is not nervous/anxious.     Physical Exam  Constitutional: She appears well-developed and well-nourished. No distress.  Neck: Neck supple. No JVD present.  Cardiovascular: Normal rate, regular rhythm and intact distal pulses.   Respiratory: Effort normal and breath sounds normal. No respiratory distress. She has no wheezes.  GI: Soft. Bowel sounds are normal. She exhibits no distension. There is no tenderness.  Musculoskeletal: She exhibits no edema.  Is able to move extremities. Her left wrist is warm; red with mild swelling present. Is able to move left wrist    Neurological: She is alert.  Skin: Skin is warm and dry. She is not diaphoretic.      ASSESSMENT/ PLAN:  1. Gout: is worse; will continue allopurinol 200 mg daily will being prednisone 20 mg daily for 5 days for an acute flare and will continue to monitor her status.

## 2014-05-15 ENCOUNTER — Non-Acute Institutional Stay (SKILLED_NURSING_FACILITY): Payer: Medicare PPO | Admitting: Internal Medicine

## 2014-05-15 ENCOUNTER — Encounter: Payer: Self-pay | Admitting: Internal Medicine

## 2014-05-15 DIAGNOSIS — I110 Hypertensive heart disease with heart failure: Secondary | ICD-10-CM

## 2014-05-15 DIAGNOSIS — IMO0002 Reserved for concepts with insufficient information to code with codable children: Secondary | ICD-10-CM

## 2014-05-15 DIAGNOSIS — R5381 Other malaise: Secondary | ICD-10-CM

## 2014-05-15 DIAGNOSIS — R32 Unspecified urinary incontinence: Secondary | ICD-10-CM

## 2014-05-15 DIAGNOSIS — L02419 Cutaneous abscess of limb, unspecified: Secondary | ICD-10-CM

## 2014-05-15 DIAGNOSIS — L03119 Cellulitis of unspecified part of limb: Secondary | ICD-10-CM

## 2014-05-15 DIAGNOSIS — E1159 Type 2 diabetes mellitus with other circulatory complications: Secondary | ICD-10-CM

## 2014-05-15 DIAGNOSIS — L8992 Pressure ulcer of unspecified site, stage 2: Secondary | ICD-10-CM

## 2014-05-15 DIAGNOSIS — M792 Neuralgia and neuritis, unspecified: Secondary | ICD-10-CM

## 2014-05-15 DIAGNOSIS — R5383 Other fatigue: Secondary | ICD-10-CM

## 2014-05-15 DIAGNOSIS — B372 Candidiasis of skin and nail: Secondary | ICD-10-CM

## 2014-05-15 DIAGNOSIS — R531 Weakness: Secondary | ICD-10-CM

## 2014-05-15 DIAGNOSIS — E039 Hypothyroidism, unspecified: Secondary | ICD-10-CM

## 2014-05-15 DIAGNOSIS — L8990 Pressure ulcer of unspecified site, unspecified stage: Secondary | ICD-10-CM

## 2014-05-15 DIAGNOSIS — I119 Hypertensive heart disease without heart failure: Secondary | ICD-10-CM | POA: Insufficient documentation

## 2014-05-15 DIAGNOSIS — I509 Heart failure, unspecified: Secondary | ICD-10-CM

## 2014-05-15 DIAGNOSIS — N390 Urinary tract infection, site not specified: Secondary | ICD-10-CM

## 2014-05-15 NOTE — Progress Notes (Signed)
Patient ID: [name] [name], female   DOB: February 15, 1925, 80 y.o.   MRN: [id number]     Facility: [facility]    PCP: Malena Peer, NP  Code Status: DNR  Allergies  Allergen Reactions  . Acetaminophen     Upset stomach  . Codeine     Chief Complaint: new admission  HPI:  80 y/o female patient is here for STR after hospital admission from 04/29/14- 05/05/14 with right lower leg cellulitis and e.coli UTI. She has history of DM, CAD s/p CABG, DVT, pvd, HTN among others. She complaints of aches in her back and extremities. She denies any other concerns. Staff concerned about sore in her bottom area. No fever or chills reported. No new behavior changes. No falls reported  Review of Systems:  Constitutional: Negative for fever, chills, weight loss, diaphoresis.  HENT: Negative for congestion, hearing loss and sore throat.   Eyes: Negative for eye pain, blurred vision  Respiratory: Negative for cough, sputum production, shortness of breath and wheezing.  on o2 by [place] Cardiovascular: Negative for chest pain, palpitations, orthopnea and leg swelling.  Gastrointestinal: Negative for heartburn, nausea, vomiting, abdominal pain, diarrhea and constipation.  Genitourinary: Negative for dysuria, urgency Musculoskeletal: Negative for falls Skin: Negative for itching and rash.  Neurological: Negative for dizziness, tingling, focal weakness and headaches.  Psychiatric/Behavioral: Negative for depression   Past Medical History  Diagnosis Date  . Hypertension   . Diabetes mellitus   . Hyperlipidemia   . Hypothyroidism   . PVD (peripheral vascular disease)   . Coronary artery disease     a. s/p CABG in [place] - late 90's/early 2000's;  b. s/p stenting  . Liver cyst   . Blood clot of neck vein   . Chronic diastolic CHF (congestive heart failure)     a. December 01, 2011 shows normal systolic function, ejection fraction greater than 55%, mild LVH, mild TR, moderate  right ventricular systolic pressures.  Marland Kitchen DVT (deep venous thrombosis)     a. RLE - chronic xarelto.   Past Surgical History  Procedure Laterality Date  . Angioplasty      right leg  . Thyroidectomy    . Cardiac catheterization    . Coronary artery bypass graft  1999   Social History:   reports that she quit smoking about 16 years ago. Her smoking use included Cigarettes. She smoked 1.00 pack per day. She does not have any smokeless tobacco history on file. She reports that she does not drink alcohol or use illicit drugs.  History reviewed. No pertinent family history.  Medications: Patient's Medications  New Prescriptions   No medications on file  Previous Medications   ALBUTEROL (PROVENTIL) (2.5 MG/3ML) 0.083% NEBULIZER SOLUTION    Take 2.5 mg by nebulization every 6 (six) hours as needed for wheezing or shortness of breath.   ALLOPURINOL (ZYLOPRIM) 100 MG TABLET    Take 200 mg by mouth daily.    AMLODIPINE (NORVASC) 5 MG TABLET    Take 5 mg by mouth daily.   ASPIRIN 81 MG TABLET    Take 81 mg by mouth daily.   ATENOLOL (TENORMIN) 100 MG TABLET    Take 1 tablet (100 mg total) by mouth daily.   GABAPENTIN (NEURONTIN) 300 MG CAPSULE    Take 300 mg by mouth 3 (three) times daily.   HYDROCODONE-ACETAMINOPHEN (NORCO/VICODIN) 5-325 MG PER TABLET    Take 1 tablet by mouth every 6 (six) hours as needed  for moderate pain.   ISOSORBIDE MONONITRATE (IMDUR) 30 MG 24 HR TABLET    TAKE ONE TABLET BY MOUTH EVERY DAY   LEVEMIR 100 UNIT/ML INJECTION    Takes 75 units am and 45 units pm daily.   LEVOTHYROXINE (SYNTHROID, LEVOTHROID) 125 MCG TABLET    Take 125 mcg by mouth daily before breakfast.   LOSARTAN (COZAAR) 50 MG TABLET    Take 50 mg by mouth daily.   ONETOUCH DELICA LANCETS 66Z MISC    daily.    ONETOUCH VERIO TEST STRIP    daily.    POTASSIUM CHLORIDE (K-DUR,KLOR-CON) 10 MEQ TABLET    Take 10 mEq by mouth daily.   RIVAROXABAN (XARELTO) 20 MG TABS TABLET    Take 20 mg by mouth daily with  supper.   SENNA-DOCUSATE (SENOKOT-S) 8.6-50 MG PER TABLET    Take 2 tablets by mouth daily.   TORSEMIDE (DEMADEX) 20 MG TABLET    Take 2 tablets (40 mg total) by mouth 2 (two) times daily.  Modified Medications   No medications on file  Discontinued Medications   No medications on file     Physical Exam: Filed Vitals:   05/15/14 0948  BP: 104/47  Pulse: 88  Temp: 98 F (36.7 C)  Resp: 18  Weight: 214 lb (97.07 kg)  SpO2: 96%    General- elderly female in no acute distress Head- atraumatic, normocephalic Eyes- PERRLA, EOMI, no pallor, no icterus Neck- no lymphadenopathy, no jugular vein distension Cardiovascular- normal s1,s2, no murmurs Respiratory- bilateral clear to auscultation, no wheeze, no rhonchi, no crackles, no use of accessory muscles Abdomen- bowel sounds present, soft, non tender, no CVA tenderness, urinary incontinence Musculoskeletal- able to move all 4 extremities, no leg edema, weakness present, grimaces with change in position, no spinal tenderness, some lumbar perispinal discomfort on palpation Neurological- no focal deficit, alert and oriented to person and place Skin- warm and dry, stage 2 ulcer on buttock area on right side, has fungal rash in buttock area Psychiatry- normal mood and affect  Labs reviewed: 04-29-14: wbc 17.2; hgb 13.4; hct 40.2; mcv 95; plt 244; glucose 94; bun 26; creat 1.29; k+4.1; na++137 alk phos 118; ast 28; alt 35; t bili 1.6; albumin 2.7 phos 2.9  Blood culture: no growth   Urine culture: e-coli  hgb a1c 7.8   04-30-14: glucose 73; bun 21; creat 1.20; k+3.9; na++144 05-02-14: glucose 172; bun 27; creat 1.50; k+ 3.8; na++137 05-03-14: wbc 13.0; hgb 10.1; hct 29.9 ;mcv 95 ;plt 214   05-04-14: wbc 13.4; hgb 9.6; hct 29.4; mcv 95; plt 231; glucose 113; bun 19; creat 1.18; k+4.1; na++140; alk phos 201; ast 20; alt 21; t bili 2.6; albumin 1.5   05-05-14: wbc 14.2; hgb 8.9; hct 27.0; mcv 94; plt 273 glucose 84; bun 19; creat 1.41; k+3.9;  na++139; alk phos 222; ast 17; alt 19; t bili 1.9; albumin 1.4 mag 2.0    Radiological Exams: 04-29-14: chest x-ray: cardiomegaly without acute findings. Lung bases are poorly evaluated   04-29-14: EKG: sinus tachycardia with pac's   04-30-14: right lower extremity doppler: no evidence of dvt   04-30-14: abdominal ultrasound: no evidence of cholecystitis. Benign appearing right renal cyst   04-30-14: 2-d echo: ef 55-60%; normal global left ventricular systolic function   9-93-57: EKG: nsr  05-04-14: chest x-ray; probable chf with cardiomegaly; and pulmonary vascular congestion. Cannot exclude small effusions.   Assessment/Plan  Generalized weakness From deconditioning and recent infection. Will have her work  with physical therapy and occupational therapy team to help with gait training and muscle strengthening exercises.fall precautions. Skin care. Encourage to be out of bed.   Pressure ulcer stage 2 Will have gel overlay mattress. Also flexicol to be applied and changed every 3 days. Will add vitamin c and zinc supplement. Pressure ulcer prophylaxis. With her urinary incontinence, will have foley placed to help with pressure ulcer healing for 2 weeks at least. reassess  Candidal rash Will have nystatin cream applied bid and have her on fluconazole 150 mg once a week for 3 weeks. Reassess. To be followed by wound care nurse  Cellulitis Completed her doxycycline course and clinical improvement noted   UTI Completed her antibiotics. Has UI. See below  Protein calorie malnutrition Continue prostat 30 cc three times daily. Will add vitamin c and zn supplement to assist with wound healing   Urinary incontinence With OAB. Will introduce foley to help with wound healing and try myrbetriq 25 mg once a day to help with UI and reassess  DVT Continue her xarelto  Diabetes a1c 7.8. cbg have been elevated recently, likely from her being on steroids. Monitor cbg. Continue levemir 45 u am  and 75 u pm. If cbg remains elevated after steroid course completion, will need to adjust levemir  Neuropathic pain Continue current regimen of neurontin and monitor  Hypothyroidism continue synthroid 125 mcg daily  Hypertensive heart disease Remains chest pain free. Continue baby aspirin, imdur, norvasc, cozaar, tenormin current regimen. Also on demadex. Monitor cmp. Weight has been stable. Monitor  Family/ staff Communication: reviewed care plan with patient and nursing supervisor   Goals of care: short term rehabilitation    Labs/tests ordered: cbc, cmp    Blanchie Serve, MD  [facility] [phone] (Monday-Friday 8 am - 5 pm) [phone] (afterhours)

## 2014-05-18 ENCOUNTER — Telehealth: Payer: Self-pay

## 2014-05-18 NOTE — Telephone Encounter (Signed)
l mom to schedule echo that pt no showed on 05/11/2014

## 2014-05-23 ENCOUNTER — Other Ambulatory Visit: Payer: Self-pay

## 2014-05-23 LAB — CBC WITH DIFFERENTIAL/PLATELET
BASOS PCT: 1.1 %
Basophil #: 0.1 10*3/uL (ref 0.0–0.1)
EOS ABS: 0.3 10*3/uL (ref 0.0–0.7)
Eosinophil %: 2.2 %
HCT: 30.9 % — ABNORMAL LOW (ref 35.0–47.0)
HGB: 9.8 g/dL — AB (ref 12.0–16.0)
LYMPHS ABS: 2.7 10*3/uL (ref 1.0–3.6)
LYMPHS PCT: 19.3 %
MCH: 30.9 pg (ref 26.0–34.0)
MCHC: 31.5 g/dL — ABNORMAL LOW (ref 32.0–36.0)
MCV: 98 fL (ref 80–100)
MONO ABS: 0.8 x10 3/mm (ref 0.2–0.9)
Monocyte %: 5.6 %
NEUTROS ABS: 9.9 10*3/uL — AB (ref 1.4–6.5)
Neutrophil %: 71.8 %
PLATELETS: 450 10*3/uL — AB (ref 150–440)
RBC: 3.16 10*6/uL — ABNORMAL LOW (ref 3.80–5.20)
RDW: 16.4 % — ABNORMAL HIGH (ref 11.5–14.5)
WBC: 13.7 10*3/uL — AB (ref 3.6–11.0)

## 2014-05-23 LAB — COMPREHENSIVE METABOLIC PANEL
ALBUMIN: 2.4 g/dL — AB (ref 3.4–5.0)
Alkaline Phosphatase: 229 U/L — ABNORMAL HIGH
Anion Gap: 6 — ABNORMAL LOW (ref 7–16)
BUN: 63 mg/dL — ABNORMAL HIGH (ref 7–18)
Bilirubin,Total: 0.5 mg/dL (ref 0.2–1.0)
CALCIUM: 8.8 mg/dL (ref 8.5–10.1)
Chloride: 98 mmol/L (ref 98–107)
Co2: 33 mmol/L — ABNORMAL HIGH (ref 21–32)
Creatinine: 2.01 mg/dL — ABNORMAL HIGH (ref 0.60–1.30)
EGFR (Non-African Amer.): 22 — ABNORMAL LOW
GFR CALC AF AMER: 25 — AB
Glucose: 277 mg/dL — ABNORMAL HIGH (ref 65–99)
Osmolality: 302 (ref 275–301)
Potassium: 4.1 mmol/L (ref 3.5–5.1)
SGOT(AST): 20 U/L (ref 15–37)
SGPT (ALT): 18 U/L (ref 12–78)
SODIUM: 137 mmol/L (ref 136–145)
Total Protein: 5.9 g/dL — ABNORMAL LOW (ref 6.4–8.2)

## 2014-05-23 LAB — HEPATIC FUNCTION PANEL
ALK PHOS: 229 U/L — AB (ref 25–125)
ALT: 18 U/L (ref 7–35)
AST: 20 U/L (ref 13–35)

## 2014-05-23 LAB — BASIC METABOLIC PANEL
BUN: 63 mg/dL — AB (ref 4–21)
Creatinine: 2 mg/dL — AB (ref 0.5–1.1)
Glucose: 277 mg/dL
Potassium: 4.1 mmol/L (ref 3.4–5.3)
SODIUM: 140 mmol/L (ref 137–147)

## 2014-05-23 LAB — CBC AND DIFFERENTIAL
HEMATOCRIT: 31 % — AB (ref 36–46)
HEMOGLOBIN: 9.8 g/dL — AB (ref 12.0–16.0)
Platelets: 450 10*3/uL — AB (ref 150–399)
WBC: 13.7 10^3/mL

## 2014-05-24 ENCOUNTER — Non-Acute Institutional Stay (SKILLED_NURSING_FACILITY): Payer: Medicare PPO | Admitting: Adult Health

## 2014-05-24 DIAGNOSIS — I509 Heart failure, unspecified: Secondary | ICD-10-CM

## 2014-05-24 DIAGNOSIS — L8992 Pressure ulcer of unspecified site, stage 2: Secondary | ICD-10-CM

## 2014-05-24 DIAGNOSIS — I110 Hypertensive heart disease with heart failure: Secondary | ICD-10-CM

## 2014-05-24 DIAGNOSIS — L8990 Pressure ulcer of unspecified site, unspecified stage: Secondary | ICD-10-CM

## 2014-05-24 DIAGNOSIS — E1159 Type 2 diabetes mellitus with other circulatory complications: Secondary | ICD-10-CM

## 2014-05-24 DIAGNOSIS — I5032 Chronic diastolic (congestive) heart failure: Secondary | ICD-10-CM

## 2014-05-28 ENCOUNTER — Encounter: Payer: Self-pay | Admitting: Adult Health

## 2014-05-28 NOTE — Progress Notes (Signed)
Patient ID: [name] [name], female   DOB: Dec 14, 1924, 80 y.o.   MRN: [id number]     [facility]  Allergies  Allergen Reactions  . Acetaminophen     Upset stomach  . Codeine      Chief Complaint  Patient presents with  . Discharge Note    HPI:  She is being discharged to home with home health for pt/ot therapies in order to improve upon her strength; gait and independence with her adl's. She will need wound care for her stage sacral wound. She will need a wheelchair with a gel cushion in order to maintain her current level of independence which she cannot achieve otherwise.  Her foley has been removed.    Past Medical History  Diagnosis Date  . Hypertension   . Diabetes mellitus   . Hyperlipidemia   . Hypothyroidism   . PVD (peripheral vascular disease)   . Coronary artery disease     a. s/p CABG in [place] - late 90's/early 2000's;  b. s/p stenting  . Liver cyst   . Blood clot of neck vein   . Chronic diastolic CHF (congestive heart failure)     a. December 01, 2011 shows normal systolic function, ejection fraction greater than 55%, mild LVH, mild TR, moderate right ventricular systolic pressures.  Marland Kitchen DVT (deep venous thrombosis)     a. RLE - chronic xarelto.    Past Surgical History  Procedure Laterality Date  . Angioplasty      right leg  . Thyroidectomy    . Cardiac catheterization    . Coronary artery bypass graft  1999    VITAL SIGNS BP 112/62  Pulse 67  Ht '5\' 4"'  (1.626 m)  Wt 214 lb 12.8 oz (97.433 kg)  BMI 36.85 kg/m2  SpO2 97%   Patient's Medications  New Prescriptions   No medications on file  Previous Medications   ALBUTEROL (PROVENTIL) (2.5 MG/3ML) 0.083% NEBULIZER SOLUTION    Take 2.5 mg by nebulization every 6 (six) hours as needed for wheezing or shortness of breath.   ALLOPURINOL (ZYLOPRIM) 100 MG TABLET    Take 200 mg by mouth daily.    AMLODIPINE (NORVASC) 5 MG TABLET    Take 5 mg by mouth daily.   ASPIRIN 81 MG TABLET    Take 81 mg by  mouth daily.   ATENOLOL (TENORMIN) 100 MG TABLET    Take 1 tablet (100 mg total) by mouth daily.   GABAPENTIN (NEURONTIN) 300 MG CAPSULE    Take 300 mg by mouth 3 (three) times daily.   HYDROCODONE-ACETAMINOPHEN (NORCO/VICODIN) 5-325 MG PER TABLET    Take 1 tablet by mouth every 6 (six) hours as needed for moderate pain.   ISOSORBIDE MONONITRATE (IMDUR) 30 MG 24 HR TABLET    TAKE ONE TABLET BY MOUTH EVERY DAY   LEVEMIR 100 UNIT/ML INJECTION    Takes 75 units am and 45 units pm daily.   LEVOTHYROXINE (SYNTHROID, LEVOTHROID) 125 MCG TABLET    Take 125 mcg by mouth daily before breakfast.   LOSARTAN (COZAAR) 50 MG TABLET    Take 50 mg by mouth daily.   ONETOUCH DELICA LANCETS 74J MISC    daily.    ONETOUCH VERIO TEST STRIP    daily.    POTASSIUM CHLORIDE (K-DUR,KLOR-CON) 10 MEQ TABLET    Take 10 mEq by mouth daily.   RIVAROXABAN (XARELTO) 20 MG TABS TABLET    Take 20 mg by mouth daily with supper.  SENNA-DOCUSATE (SENOKOT-S) 8.6-50 MG PER TABLET    Take 2 tablets by mouth daily.  Modified Medications   Modified Medication Previous Medication   TORSEMIDE (DEMADEX) 20 MG TABLET torsemide (DEMADEX) 20 MG tablet      Take 40 mg by mouth daily.    Take 2 tablets (40 mg total) by mouth 2 (two) times daily.  Discontinued Medications   No medications on file    SIGNIFICANT DIAGNOSTIC EXAMS  04-29-14: chest x-ray: cardiomegaly without acute findings. Lung bases are poorly evaluated   04-29-14: EKG: sinus tachycardia with pac's   04-30-14: right lower extremity doppler: no evidence of dvt   04-30-14: abdominal ultrasound: no evidence of cholecystitis. Benign appearing right renal cyst   04-30-14: 2-d echo: ef 55-60%; normal global left ventricular systolic function   0-38-88: EKG: nsr  05-04-14: chest x-ray; probable chf with cardiomegaly; and pulmonary vascular congestion. Cannot exclude small effusions.     LABS REVIEWED:   04-29-14: wbc 17.2; hgb 13.4; hct 40.2; mcv 95; plt 244; glucose 94;  bun 26; creat 1.29; k+4.1; na++137 alk phos 118; ast 28; alt 35; t bili 1.6; albumin 2.7 phos 2.9  Blood culture: no growth  Urine culture: e-coli  hgb a1c 7.8  04-30-14: glucose 73; bun 21; creat 1.20; k+3.9; na++144 05-02-14: glucose 172; bun 27; creat 1.50; k+ 3.8; na++137 05-03-14: wbc 13.0; hgb 10.1; hct 29.9 ;mcv 95 ;plt 214  05-04-14: wbc 13.4; hgb 9.6; hct 29.4; mcv 95; plt 231; glucose 113; bun 19; creat 1.18; k+4.1; na++140; alk phos 201; ast 20; alt 21; t bili 2.6; albumin 1.5  05-05-14: wbc 14.2; hgb 8.9; hct 27.0; mcv 94; plt 273 glucose 84; bun 19; creat 1.41; k+3.9; na++139; alk phos 222; ast 17; alt 19; t bili 1.9; albumin 1.4 mag 2.0  05-23-14: ;wbc 13.7; hgb 9.8; hcy 30.9; mcv 98; plt 450; glucose 277; bun 63; creat 2.01; k+4.1 na++140; ast 20; alt 18; alk phos 229 ;albumin 229     Review of Systems  Constitutional: Negative for malaise/fatigue.  Respiratory: Negative for cough and shortness of breath.   Cardiovascular: Negative for chest pain and palpitations.  Gastrointestinal: . Negative for heartburn and abdominal pain.  Musculoskeletal:  Skin: Negative.   Psychiatric/Behavioral: The patient is not nervous/anxious.     Physical Exam  Constitutional: She appears well-developed and well-nourished. No distress.  Neck: Neck supple. No JVD present.  Cardiovascular: Normal rate, regular rhythm and intact distal pulses.   Respiratory: Effort normal and breath sounds normal. No respiratory distress. She has no wheezes.  GI: Soft. Bowel sounds are normal. She exhibits no distension. There is no tenderness.  Musculoskeletal: She exhibits no edema.  Neurological: She is alert.  Skin: Skin is warm and dry. She is not diaphoretic.      ASSESSMENT/ PLAN:  Will discharge her to home with home health services for pt/ot/wound care and home health aid. She will need a wheelchair with gel cushion. Her prescriptions have been written for a 30 day supply of her medications. She has a  follow up appointment with Kasandra Knudsen NP at [place] on 05-26-14 at 11 am. She will need cbc and cmp prior to follow up appointment. Her demadex has been reduced to 20 mg daily     Time spent with patient 45 minutes.    Ok Edwards NP [facility]  Contact [phone] Monday through Friday 8am- 5pm  After hours call [phone]

## 2014-06-02 ENCOUNTER — Ambulatory Visit: Payer: Medicare PPO | Admitting: Cardiovascular Disease

## 2014-06-02 ENCOUNTER — Encounter: Payer: Self-pay | Admitting: *Deleted

## 2014-06-05 ENCOUNTER — Other Ambulatory Visit: Payer: Self-pay | Admitting: Adult Health

## 2014-06-06 ENCOUNTER — Ambulatory Visit: Payer: Self-pay | Admitting: Vascular Surgery

## 2014-06-06 LAB — BASIC METABOLIC PANEL
Anion Gap: 6 — ABNORMAL LOW (ref 7–16)
BUN: 35 mg/dL — ABNORMAL HIGH (ref 7–18)
CREATININE: 1.49 mg/dL — AB (ref 0.60–1.30)
Calcium, Total: 9.6 mg/dL (ref 8.5–10.1)
Chloride: 104 mmol/L (ref 98–107)
Co2: 35 mmol/L — ABNORMAL HIGH (ref 21–32)
EGFR (Non-African Amer.): 31 — ABNORMAL LOW
GFR CALC AF AMER: 36 — AB
GLUCOSE: 116 mg/dL — AB (ref 65–99)
OSMOLALITY: 298 (ref 275–301)
Potassium: 3.7 mmol/L (ref 3.5–5.1)
SODIUM: 145 mmol/L (ref 136–145)

## 2014-06-10 ENCOUNTER — Ambulatory Visit: Payer: Self-pay | Admitting: Internal Medicine

## 2014-06-20 ENCOUNTER — Other Ambulatory Visit: Payer: Self-pay | Admitting: Adult Health

## 2014-06-21 ENCOUNTER — Other Ambulatory Visit: Payer: Self-pay | Admitting: Adult Health

## 2014-06-22 ENCOUNTER — Other Ambulatory Visit: Payer: Self-pay

## 2014-06-22 ENCOUNTER — Telehealth: Payer: Self-pay

## 2014-06-22 MED ORDER — AMLODIPINE BESYLATE 5 MG PO TABS: 5.0000 mg | ORAL_TABLET | Freq: Every day | ORAL | Status: DC

## 2014-06-22 NOTE — Telephone Encounter (Signed)
Spoke w/ pt's daughter.  She is requesting refill on pt's amlodipine.  Advised this was refilled this am.  She is appreciative and will call w/ any other questions or concerns.

## 2014-06-22 NOTE — Telephone Encounter (Signed)
Refill sent for norvasc.  

## 2014-06-22 NOTE — Telephone Encounter (Signed)
Pt daughter called, has question regarding amlodipine please call

## 2014-06-23 ENCOUNTER — Other Ambulatory Visit: Payer: Self-pay

## 2014-06-23 MED ORDER — LOSARTAN POTASSIUM 50 MG PO TABS: 50.0000 mg | ORAL_TABLET | Freq: Every day | ORAL | Status: DC

## 2014-06-23 MED ORDER — ATENOLOL 100 MG PO TABS: 100.0000 mg | ORAL_TABLET | Freq: Every day | ORAL | Status: DC

## 2014-06-23 NOTE — Telephone Encounter (Signed)
Refill sent for atenolol & losartan.

## 2014-06-27 ENCOUNTER — Inpatient Hospital Stay: Payer: Self-pay | Admitting: Internal Medicine

## 2014-06-27 LAB — BASIC METABOLIC PANEL
ANION GAP: 8 (ref 7–16)
Anion Gap: 12 (ref 7–16)
BUN: 32 mg/dL — ABNORMAL HIGH (ref 7–18)
BUN: 28 mg/dL — AB (ref 7–18)
CALCIUM: 10.1 mg/dL (ref 8.5–10.1)
CALCIUM: 7.5 mg/dL — AB (ref 8.5–10.1)
CHLORIDE: 103 mmol/L (ref 98–107)
CHLORIDE: 106 mmol/L (ref 98–107)
CREATININE: 1.36 mg/dL — AB (ref 0.60–1.30)
CREATININE: 1.36 mg/dL — AB (ref 0.60–1.30)
Co2: 34 mmol/L — ABNORMAL HIGH (ref 21–32)
Co2: 28 mmol/L (ref 21–32)
EGFR (African American): 41 — ABNORMAL LOW
EGFR (Non-African Amer.): 35 — ABNORMAL LOW
GFR CALC AF AMER: 41 — AB
GFR CALC NON AF AMER: 35 — AB
Glucose: 80 mg/dL (ref 65–99)
Glucose: 358 mg/dL — ABNORMAL HIGH (ref 65–99)
OSMOLALITY: 310 (ref 275–301)
Osmolality: 295 (ref 275–301)
Potassium: 3.4 mmol/L — ABNORMAL LOW (ref 3.5–5.1)
Potassium: 3.3 mmol/L — ABNORMAL LOW (ref 3.5–5.1)
SODIUM: 145 mmol/L (ref 136–145)
Sodium: 146 mmol/L — ABNORMAL HIGH (ref 136–145)

## 2014-06-27 LAB — CBC WITH DIFFERENTIAL/PLATELET
BASOS ABS: 0.1 10*3/uL (ref 0.0–0.1)
BASOS PCT: 0.7 %
EOS ABS: 0.2 10*3/uL (ref 0.0–0.7)
Eosinophil %: 1.3 %
HCT: 30.2 % — AB (ref 35.0–47.0)
HGB: 9.9 g/dL — ABNORMAL LOW (ref 12.0–16.0)
Lymphocyte #: 4.9 10*3/uL — ABNORMAL HIGH (ref 1.0–3.6)
Lymphocyte %: 40.3 %
MCH: 31.8 pg (ref 26.0–34.0)
MCHC: 32.9 g/dL (ref 32.0–36.0)
MCV: 97 fL (ref 80–100)
Monocyte #: 0.7 x10 3/mm (ref 0.2–0.9)
Monocyte %: 5.9 %
NEUTROS PCT: 51.8 %
Neutrophil #: 6.3 10*3/uL (ref 1.4–6.5)
Platelet: 298 10*3/uL (ref 150–440)
RBC: 3.12 10*6/uL — ABNORMAL LOW (ref 3.80–5.20)
RDW: 14.7 % — ABNORMAL HIGH (ref 11.5–14.5)
WBC: 12.1 10*3/uL — AB (ref 3.6–11.0)

## 2014-06-27 LAB — HEMATOCRIT: HCT: 24.1 % — ABNORMAL LOW (ref 35.0–47.0)

## 2014-06-27 LAB — HEMOGLOBIN: HGB: 7.5 g/dL — ABNORMAL LOW (ref 12.0–16.0)

## 2014-06-27 LAB — TROPONIN I: TROPONIN-I: 0.02 ng/mL

## 2014-06-27 LAB — APTT: Activated PTT: 46.4 secs — ABNORMAL HIGH (ref 23.6–35.9)

## 2014-06-28 LAB — BASIC METABOLIC PANEL
ANION GAP: 8 (ref 7–16)
BUN: 34 mg/dL — ABNORMAL HIGH (ref 7–18)
CHLORIDE: 105 mmol/L (ref 98–107)
Calcium, Total: 7.1 mg/dL — ABNORMAL LOW (ref 8.5–10.1)
Co2: 30 mmol/L (ref 21–32)
Creatinine: 1.93 mg/dL — ABNORMAL HIGH (ref 0.60–1.30)
EGFR (African American): 27 — ABNORMAL LOW
EGFR (Non-African Amer.): 23 — ABNORMAL LOW
Glucose: 296 mg/dL — ABNORMAL HIGH (ref 65–99)
OSMOLALITY: 304 (ref 275–301)
Potassium: 4.1 mmol/L (ref 3.5–5.1)
SODIUM: 143 mmol/L (ref 136–145)

## 2014-06-28 LAB — CBC WITH DIFFERENTIAL/PLATELET
BASOS ABS: 0.1 10*3/uL (ref 0.0–0.1)
BASOS PCT: 0.7 %
Eosinophil #: 0.1 10*3/uL (ref 0.0–0.7)
Eosinophil %: 0.6 %
HCT: 19.8 % — ABNORMAL LOW (ref 35.0–47.0)
HGB: 6.2 g/dL — AB (ref 12.0–16.0)
Lymphocyte #: 2.9 10*3/uL (ref 1.0–3.6)
Lymphocyte %: 24.4 %
MCH: 30.5 pg (ref 26.0–34.0)
MCHC: 31.4 g/dL — AB (ref 32.0–36.0)
MCV: 97 fL (ref 80–100)
MONOS PCT: 7.8 %
Monocyte #: 0.9 x10 3/mm (ref 0.2–0.9)
Neutrophil #: 7.8 10*3/uL — ABNORMAL HIGH (ref 1.4–6.5)
Neutrophil %: 66.5 %
Platelet: 227 10*3/uL (ref 150–440)
RBC: 2.04 10*6/uL — ABNORMAL LOW (ref 3.80–5.20)
RDW: 14.8 % — ABNORMAL HIGH (ref 11.5–14.5)
WBC: 11.8 10*3/uL — ABNORMAL HIGH (ref 3.6–11.0)

## 2014-06-28 LAB — CK TOTAL AND CKMB (NOT AT ARMC)
CK, TOTAL: 46 U/L
CK-MB: 2.1 ng/mL (ref 0.5–3.6)

## 2014-06-28 LAB — TROPONIN I: TROPONIN-I: 0.57 ng/mL — AB

## 2014-06-29 ENCOUNTER — Ambulatory Visit: Payer: Self-pay | Admitting: Urology

## 2014-06-29 LAB — BASIC METABOLIC PANEL
Anion Gap: 8 (ref 7–16)
BUN: 27 mg/dL — AB (ref 7–18)
CALCIUM: 7.7 mg/dL — AB (ref 8.5–10.1)
CHLORIDE: 113 mmol/L — AB (ref 98–107)
Co2: 25 mmol/L (ref 21–32)
Creatinine: 1.82 mg/dL — ABNORMAL HIGH (ref 0.60–1.30)
EGFR (African American): 29 — ABNORMAL LOW
GFR CALC NON AF AMER: 25 — AB
GLUCOSE: 270 mg/dL — AB (ref 65–99)
OSMOLALITY: 305 (ref 275–301)
POTASSIUM: 4.3 mmol/L (ref 3.5–5.1)
Sodium: 146 mmol/L — ABNORMAL HIGH (ref 136–145)

## 2014-06-29 LAB — IRON AND TIBC
IRON BIND. CAP.(TOTAL): 193 ug/dL — AB (ref 250–450)
IRON SATURATION: 11 %
IRON: 22 ug/dL — AB (ref 50–170)
Unbound Iron-Bind.Cap.: 171 ug/dL

## 2014-06-29 LAB — HEMOGLOBIN: HGB: 5.4 g/dL — AB (ref 12.0–16.0)

## 2014-06-29 LAB — RETICULOCYTES
ABSOLUTE RETIC COUNT: 0.0739 10*6/uL (ref 0.019–0.186)
Reticulocyte: 4.18 % — ABNORMAL HIGH (ref 0.4–3.1)

## 2014-06-29 LAB — FERRITIN: FERRITIN (ARMC): 184 ng/mL (ref 8–388)

## 2014-06-30 LAB — BASIC METABOLIC PANEL
ANION GAP: 8 (ref 7–16)
BUN: 22 mg/dL — AB (ref 7–18)
Calcium, Total: 7.7 mg/dL — ABNORMAL LOW (ref 8.5–10.1)
Chloride: 115 mmol/L — ABNORMAL HIGH (ref 98–107)
Co2: 24 mmol/L (ref 21–32)
Creatinine: 1.43 mg/dL — ABNORMAL HIGH (ref 0.60–1.30)
EGFR (Non-African Amer.): 33 — ABNORMAL LOW
GFR CALC AF AMER: 38 — AB
GLUCOSE: 201 mg/dL — AB (ref 65–99)
Osmolality: 301 (ref 275–301)
Potassium: 3.9 mmol/L (ref 3.5–5.1)
SODIUM: 147 mmol/L — AB (ref 136–145)

## 2014-06-30 LAB — CBC WITH DIFFERENTIAL/PLATELET
Basophil #: 0 10*3/uL (ref 0.0–0.1)
Basophil %: 0.4 %
EOS ABS: 0.1 10*3/uL (ref 0.0–0.7)
EOS PCT: 0.5 %
HCT: 15.8 % — ABNORMAL LOW (ref 35.0–47.0)
HGB: 5.1 g/dL — AB (ref 12.0–16.0)
LYMPHS ABS: 1.7 10*3/uL (ref 1.0–3.6)
LYMPHS PCT: 14.3 %
MCH: 31.5 pg (ref 26.0–34.0)
MCHC: 32.2 g/dL (ref 32.0–36.0)
MCV: 98 fL (ref 80–100)
MONO ABS: 0.9 x10 3/mm (ref 0.2–0.9)
MONOS PCT: 7.3 %
Neutrophil #: 9.4 10*3/uL — ABNORMAL HIGH (ref 1.4–6.5)
Neutrophil %: 77.5 %
Platelet: 189 10*3/uL (ref 150–440)
RBC: 1.62 10*6/uL — ABNORMAL LOW (ref 3.80–5.20)
RDW: 14.6 % — ABNORMAL HIGH (ref 11.5–14.5)
WBC: 12.1 10*3/uL — AB (ref 3.6–11.0)

## 2014-07-01 LAB — BASIC METABOLIC PANEL
ANION GAP: 11 (ref 7–16)
BUN: 23 mg/dL — AB (ref 7–18)
CO2: 21 mmol/L (ref 21–32)
CREATININE: 1.55 mg/dL — AB (ref 0.60–1.30)
Calcium, Total: 8 mg/dL — ABNORMAL LOW (ref 8.5–10.1)
Chloride: 113 mmol/L — ABNORMAL HIGH (ref 98–107)
EGFR (Non-African Amer.): 30 — ABNORMAL LOW
GFR CALC AF AMER: 35 — AB
Glucose: 205 mg/dL — ABNORMAL HIGH (ref 65–99)
Osmolality: 298 (ref 275–301)
POTASSIUM: 4.1 mmol/L (ref 3.5–5.1)
SODIUM: 145 mmol/L (ref 136–145)

## 2014-07-02 LAB — BASIC METABOLIC PANEL
Anion Gap: 9 (ref 7–16)
BUN: 25 mg/dL — ABNORMAL HIGH (ref 7–18)
CO2: 22 mmol/L (ref 21–32)
Calcium, Total: 8.2 mg/dL — ABNORMAL LOW (ref 8.5–10.1)
Chloride: 114 mmol/L — ABNORMAL HIGH (ref 98–107)
Creatinine: 1.51 mg/dL — ABNORMAL HIGH (ref 0.60–1.30)
GFR CALC AF AMER: 36 — AB
GFR CALC NON AF AMER: 31 — AB
Glucose: 169 mg/dL — ABNORMAL HIGH (ref 65–99)
Osmolality: 297 (ref 275–301)
Potassium: 4 mmol/L (ref 3.5–5.1)
SODIUM: 145 mmol/L (ref 136–145)

## 2014-07-02 LAB — CBC WITH DIFFERENTIAL/PLATELET
BASOS ABS: 0.1 10*3/uL (ref 0.0–0.1)
Basophil %: 0.4 %
Eosinophil #: 0.1 10*3/uL (ref 0.0–0.7)
Eosinophil %: 0.9 %
HCT: 16.7 % — ABNORMAL LOW (ref 35.0–47.0)
HGB: 5.1 g/dL — AB (ref 12.0–16.0)
LYMPHS ABS: 3.2 10*3/uL (ref 1.0–3.6)
Lymphocyte %: 24.7 %
MCH: 30.9 pg (ref 26.0–34.0)
MCHC: 30.5 g/dL — AB (ref 32.0–36.0)
MCV: 102 fL — ABNORMAL HIGH (ref 80–100)
MONOS PCT: 8.4 %
Monocyte #: 1.1 x10 3/mm — ABNORMAL HIGH (ref 0.2–0.9)
NEUTROS PCT: 65.6 %
Neutrophil #: 8.6 10*3/uL — ABNORMAL HIGH (ref 1.4–6.5)
PLATELETS: 205 10*3/uL (ref 150–440)
RBC: 1.65 10*6/uL — AB (ref 3.80–5.20)
RDW: 15 % — ABNORMAL HIGH (ref 11.5–14.5)
WBC: 13.1 10*3/uL — AB (ref 3.6–11.0)

## 2014-07-03 LAB — BASIC METABOLIC PANEL
Anion Gap: 12 (ref 7–16)
BUN: 25 mg/dL — ABNORMAL HIGH (ref 7–18)
CO2: 20 mmol/L — AB (ref 21–32)
Calcium, Total: 8.1 mg/dL — ABNORMAL LOW (ref 8.5–10.1)
Chloride: 114 mmol/L — ABNORMAL HIGH (ref 98–107)
Creatinine: 1.38 mg/dL — ABNORMAL HIGH (ref 0.60–1.30)
EGFR (African American): 40 — ABNORMAL LOW
EGFR (Non-African Amer.): 35 — ABNORMAL LOW
Glucose: 156 mg/dL — ABNORMAL HIGH (ref 65–99)
Osmolality: 298 (ref 275–301)
Potassium: 4.3 mmol/L (ref 3.5–5.1)
Sodium: 146 mmol/L — ABNORMAL HIGH (ref 136–145)

## 2014-07-04 LAB — BASIC METABOLIC PANEL
Anion Gap: 11 (ref 7–16)
BUN: 23 mg/dL — ABNORMAL HIGH (ref 7–18)
Calcium, Total: 8.1 mg/dL — ABNORMAL LOW (ref 8.5–10.1)
Chloride: 114 mmol/L — ABNORMAL HIGH (ref 98–107)
Co2: 22 mmol/L (ref 21–32)
Creatinine: 1.12 mg/dL (ref 0.60–1.30)
EGFR (African American): 52 — ABNORMAL LOW
EGFR (Non-African Amer.): 44 — ABNORMAL LOW
GLUCOSE: 187 mg/dL — AB (ref 65–99)
Osmolality: 301 (ref 275–301)
Potassium: 5 mmol/L (ref 3.5–5.1)
Sodium: 147 mmol/L — ABNORMAL HIGH (ref 136–145)

## 2014-07-05 ENCOUNTER — Telehealth: Payer: Self-pay

## 2014-07-05 LAB — BASIC METABOLIC PANEL
Anion Gap: 9 (ref 7–16)
BUN: 16 mg/dL (ref 7–18)
CO2: 26 mmol/L (ref 21–32)
Calcium, Total: 8.2 mg/dL — ABNORMAL LOW (ref 8.5–10.1)
Chloride: 112 mmol/L — ABNORMAL HIGH (ref 98–107)
Creatinine: 1.25 mg/dL (ref 0.60–1.30)
EGFR (Non-African Amer.): 39 — ABNORMAL LOW
GFR CALC AF AMER: 45 — AB
Glucose: 209 mg/dL — ABNORMAL HIGH (ref 65–99)
Osmolality: 300 (ref 275–301)
POTASSIUM: 3.9 mmol/L (ref 3.5–5.1)
SODIUM: 147 mmol/L — AB (ref 136–145)

## 2014-07-05 NOTE — Telephone Encounter (Signed)
Spoke w/ pt's daughter.  She states that she is very upset that another cardiologist was consulted for her mother in [facility]. Advised her that Dr. Mariah Milling has reviewed her chart and agrees w/ the plan of care that pt is receiving.  Advised her to have pt sched f/u w/ our office on discharge.  She is appreciative and will call back w/ any questions or concerns.

## 2014-07-05 NOTE — Telephone Encounter (Signed)
Pt daughter wanted to let Dr. Mariah Milling know pt is in hospital, has been there since Tuesday.

## 2014-07-06 LAB — BASIC METABOLIC PANEL
Anion Gap: 8 (ref 7–16)
BUN: 13 mg/dL (ref 7–18)
CALCIUM: 7.9 mg/dL — AB (ref 8.5–10.1)
CO2: 27 mmol/L (ref 21–32)
Chloride: 112 mmol/L — ABNORMAL HIGH (ref 98–107)
Creatinine: 1.18 mg/dL (ref 0.60–1.30)
EGFR (African American): 48 — ABNORMAL LOW
EGFR (Non-African Amer.): 42 — ABNORMAL LOW
GLUCOSE: 173 mg/dL — AB (ref 65–99)
OSMOLALITY: 297 (ref 275–301)
POTASSIUM: 3.7 mmol/L (ref 3.5–5.1)
Sodium: 147 mmol/L — ABNORMAL HIGH (ref 136–145)

## 2014-07-09 ENCOUNTER — Other Ambulatory Visit: Payer: Self-pay | Admitting: Family Medicine

## 2014-07-09 LAB — URIC ACID: Uric Acid: 8.3 mg/dL — ABNORMAL HIGH (ref 2.6–6.0)

## 2014-07-10 ENCOUNTER — Other Ambulatory Visit: Payer: Self-pay | Admitting: Family Medicine

## 2014-07-10 LAB — URINALYSIS, COMPLETE
BILIRUBIN, UR: NEGATIVE
Bacteria: NONE SEEN
Blood: NEGATIVE
Glucose,UR: NEGATIVE mg/dL (ref 0–75)
Ketone: NEGATIVE
LEUKOCYTE ESTERASE: NEGATIVE
Nitrite: NEGATIVE
PH: 8 (ref 4.5–8.0)
Protein: NEGATIVE
SPECIFIC GRAVITY: 1.005 (ref 1.003–1.030)
Squamous Epithelial: NONE SEEN

## 2014-07-11 ENCOUNTER — Ambulatory Visit: Payer: Self-pay | Admitting: Internal Medicine

## 2014-07-11 LAB — URINE CULTURE

## 2014-07-16 ENCOUNTER — Other Ambulatory Visit: Payer: Self-pay | Admitting: Family Medicine

## 2014-07-16 LAB — CBC WITH DIFFERENTIAL/PLATELET
BASOS ABS: 0.1 10*3/uL (ref 0.0–0.1)
BASOS PCT: 0.5 %
EOS ABS: 0 10*3/uL (ref 0.0–0.7)
Eosinophil %: 0.1 %
HCT: 24.3 % — ABNORMAL LOW (ref 35.0–47.0)
HGB: 7.7 g/dL — AB (ref 12.0–16.0)
LYMPHS ABS: 1.1 10*3/uL (ref 1.0–3.6)
Lymphocyte %: 7.7 %
MCH: 31.1 pg (ref 26.0–34.0)
MCHC: 31.7 g/dL — AB (ref 32.0–36.0)
MCV: 98 fL (ref 80–100)
Monocyte #: 1.5 x10 3/mm — ABNORMAL HIGH (ref 0.2–0.9)
Monocyte %: 10.7 %
NEUTROS ABS: 11.2 10*3/uL — AB (ref 1.4–6.5)
Neutrophil %: 81 %
Platelet: 370 10*3/uL (ref 150–440)
RBC: 2.48 10*6/uL — AB (ref 3.80–5.20)
RDW: 16 % — ABNORMAL HIGH (ref 11.5–14.5)
WBC: 13.8 10*3/uL — AB (ref 3.6–11.0)

## 2014-07-16 LAB — COMPREHENSIVE METABOLIC PANEL
ANION GAP: 10 (ref 7–16)
Albumin: 2.2 g/dL — ABNORMAL LOW (ref 3.4–5.0)
Alkaline Phosphatase: 144 U/L — ABNORMAL HIGH
BILIRUBIN TOTAL: 0.7 mg/dL (ref 0.2–1.0)
BUN: 33 mg/dL — AB (ref 7–18)
CALCIUM: 8.4 mg/dL — AB (ref 8.5–10.1)
Chloride: 101 mmol/L (ref 98–107)
Co2: 31 mmol/L (ref 21–32)
Creatinine: 2.15 mg/dL — ABNORMAL HIGH (ref 0.60–1.30)
EGFR (Non-African Amer.): 20 — ABNORMAL LOW
GFR CALC AF AMER: 23 — AB
GLUCOSE: 92 mg/dL (ref 65–99)
Osmolality: 290 (ref 275–301)
POTASSIUM: 4.2 mmol/L (ref 3.5–5.1)
SGOT(AST): 23 U/L (ref 15–37)
SGPT (ALT): 10 U/L — ABNORMAL LOW
Sodium: 142 mmol/L (ref 136–145)
Total Protein: 5.4 g/dL — ABNORMAL LOW (ref 6.4–8.2)

## 2014-07-20 ENCOUNTER — Ambulatory Visit: Payer: Self-pay | Admitting: Internal Medicine

## 2014-09-14 ENCOUNTER — Ambulatory Visit (INDEPENDENT_AMBULATORY_CARE_PROVIDER_SITE_OTHER): Payer: Medicare PPO | Admitting: Cardiovascular Disease

## 2014-09-14 ENCOUNTER — Encounter: Payer: Self-pay | Admitting: Cardiovascular Disease

## 2014-09-14 VITALS — BP 120/70 | HR 95 | Ht 63.0 in | Wt 156.8 lb

## 2014-09-14 DIAGNOSIS — E118 Type 2 diabetes mellitus with unspecified complications: Secondary | ICD-10-CM

## 2014-09-14 DIAGNOSIS — I5032 Chronic diastolic (congestive) heart failure: Secondary | ICD-10-CM

## 2014-09-14 DIAGNOSIS — R079 Chest pain, unspecified: Secondary | ICD-10-CM

## 2014-09-14 DIAGNOSIS — E785 Hyperlipidemia, unspecified: Secondary | ICD-10-CM

## 2014-09-14 DIAGNOSIS — R0602 Shortness of breath: Secondary | ICD-10-CM

## 2014-09-14 DIAGNOSIS — I110 Hypertensive heart disease with heart failure: Secondary | ICD-10-CM

## 2014-09-14 DIAGNOSIS — I2510 Atherosclerotic heart disease of native coronary artery without angina pectoris: Secondary | ICD-10-CM

## 2014-09-14 DIAGNOSIS — I739 Peripheral vascular disease, unspecified: Secondary | ICD-10-CM

## 2014-09-14 DIAGNOSIS — R531 Weakness: Secondary | ICD-10-CM

## 2014-09-14 DIAGNOSIS — I509 Heart failure, unspecified: Secondary | ICD-10-CM

## 2014-09-14 NOTE — Progress Notes (Signed)
Patient ID: Damita DunningsBeatrice B [name], female    DOB: 1925-09-12, 80 y.o.   MRN: [id number]  HPI Comments: [name] is a very pleasant 80 year old female with past medical history of coronary artery disease, bypass surgery, hypertension, diabetes, chronic lower extremity edema, Pulmonary hypertension (moderate), hyperlipidemia, peripheral vascular disease. She presents for routine follow-up  In follow-up today, she had recent [facility] for [facility] She had [facility] vessels [facility] on [facility]2015with significant procedure blood loss, hemoglobin into the 5 range. She was not transfused as she was a Jehovah witness. In this setting, she had an MI. Echocardiogram showed essentially normal LV function with inferior wall hypokinesis. Plan was made to manage medically Notes also indicate she had acute respiratory failure, acute kidney injury  She presents from [facility] and over the past 3 months has had 30 pound weight loss. Many of her medications were held including losartan, aspirin, simvastatin and torsemide.Marland Kitchen.  She was discharged on amlodipine but she is not taking that on today's visit She is on metoprolol every 6 hours. She has minimal ankle edema, worse on the right side compared to the left. She denies any significant shortness of breath. She is not walking very much, presents in a wheelchair. Daughter reports that appetite is poor  Echocardiogram report was reviewed from 06/28/2014 showing ejection fraction 65-70%, entire inferior wall with hypokinesis, moderately dilated left atrium  EKG done on today's visit shows normal sinus rhythm with rate 95 bpm, T-wave abnormality in the inferior leads  Other past medical history  present[facility] to [facility] November 30, 2011 with hypotension and bradycardia.  She was found to be in a junctional rhythm with heart rate of 46 beats per minute, systolic blood pressure of 80. Her Cardizem  was held with improvement of her heart rate and blood pressure.  Baseline creatinine 1.5, elevated liver enzymes.  Echocardiogram December 01, 2011 shows normal systolic function, ejection fraction greater than 55%, mild LVH, mild TR, moderate right ventricular systolic pressures. CT scan of the carotid arteries shows calcification  EKG shows normal sinus rhythm with rate 93 beats per minute with no significant ST or T wave chang  Outpatient Encounter Prescriptions as of 09/14/2014  Medication Sig  . allopurinol (ZYLOPRIM) 100 MG tablet Take 200 mg by mouth daily.   Marland Kitchen. gabapentin (NEURONTIN) 300 MG capsule Take 300 mg by mouth 3 (three) times daily.  Marland Kitchen. HYDROcodone-acetaminophen (NORCO/VICODIN) 5-325 MG per tablet Take 1 tablet by mouth every 6 (six) hours as needed for moderate pain.  Marland Kitchen. insulin aspart (NOVOLOG) 100 UNIT/ML injection Sliding scale  . iron polysaccharides (NIFEREX) 150 MG capsule Take 150 mg by mouth daily.  . isosorbide mononitrate (IMDUR) 30 MG 24 hr tablet TAKE ONE TABLET BY MOUTH EVERY DAY  . LEVEMIR 100 UNIT/ML injection Inject 40 Units into the skin daily.   Marland Kitchen. levothyroxine (SYNTHROID, LEVOTHROID) 125 MCG tablet Take 125 mcg by mouth daily before breakfast.  . LORazepam (ATIVAN) 0.5 MG tablet Take 0.5 mg by mouth every 6 (six) hours as needed for anxiety.  . metoprolol tartrate (LOPRESSOR) 25 MG tablet Take 25 mg by mouth every 6 (six) hours.  . nitroGLYCERIN (NITROSTAT) 0.4 MG SL tablet Place 0.4 mg under the tongue every 5 (five) minutes as needed for chest pain.  [name]. ONETOUCH DELICA LANCETS 33G MISC daily.   [name]. ONETOUCH VERIO test strip daily.        In terms  of her social history  reports that she quit smoking about 16 years ago. Her smoking use included Cigarettes. She smoked 1.00 pack per day. She does not have any smokeless tobacco history on file. She reports that she does not drink alcohol or use illicit drugs.  Review of Systems  Constitutional: Negative.    Respiratory: Negative.   Cardiovascular: Positive for leg swelling.       Trace ankle edema  Gastrointestinal: Negative.   Endocrine: Negative.   Musculoskeletal: Positive for back pain, arthralgias and gait problem.  Neurological: Negative.   All other systems reviewed and are negative.   BP 120/70 mmHg  Pulse 95  Ht 5\' 3"  (1.6 m)  Wt 156 lb 12 oz (71.101 kg)  BMI 27.77 kg/m2  Physical Exam  Constitutional: She is oriented to person, place, and time. She appears well-developed and well-nourished.  HENT:  Head: Normocephalic.  Nose: Nose normal.  Mouth/Throat: Oropharynx is clear and moist.  Eyes: Conjunctivae are normal. Pupils are equal, round, and reactive to light.  Neck: Normal range of motion. Neck supple. No JVD present.  Cardiovascular: Normal rate, regular rhythm, S1 normal, S2 normal, normal heart sounds and intact distal pulses.  Exam reveals no gallop and no friction rub.   No murmur heard. Pulmonary/Chest: Effort normal and breath sounds normal. No respiratory distress. She has no wheezes. She has no rales. She exhibits no tenderness.  Abdominal: Soft. Bowel sounds are normal. She exhibits no distension. There is no tenderness.  Musculoskeletal: Normal range of motion. She exhibits no edema or tenderness.  Lymphadenopathy:    She has no cervical adenopathy.  Neurological: She is alert and oriented to person, place, and time. Coordination normal.  Skin: Skin is warm and dry. No rash noted. No erythema.  Psychiatric: She has a normal mood and affect. Her behavior is normal. Judgment and thought content normal.    Assessment and Plan  Nursing note and vitals reviewed.

## 2014-09-14 NOTE — Assessment & Plan Note (Signed)
Recommended that we restart simvastatin in follow-up visit

## 2014-09-14 NOTE — Assessment & Plan Note (Signed)
Recommended close follow-up with primary care for management of her diabetes We have encouraged continued exercise, careful diet management in an effort to lose weight. She does report recent 30 pound weight loss

## 2014-09-14 NOTE — Assessment & Plan Note (Signed)
She is slowly improving following recent hospitalization in August. Currently at [facility] receiving therapy

## 2014-09-14 NOTE — Patient Instructions (Addendum)
Your next appointment will be scheduled in our new office located at :  [facility]  [address], [address]  [place], [place] [id number]   You are doing well. 1) Please change the metoprolol to 50 mg po BID 2) Start aspirin 81 mg daily 3) Please check your weight on Monday/wednesday and Friday If your weight is greater than 162 pounds, Please take torsemide 20 mg with potassium 10 meq on Monday/Wedneday/Fridays   Please call us if you have new issues that need to be addressed before your next appt.  Your physician wants you to follow-up in: 3 months.

## 2014-09-14 NOTE — Assessment & Plan Note (Signed)
Weight is down 30 pounds from prior clinic visits. We have recommended she take torsemide 20 mg with potassium 10 mEq 3 times per week for weight over 162 pounds. Weight today 156 pounds

## 2014-09-14 NOTE — Assessment & Plan Note (Signed)
Recent MI in the setting of profound anemia. Currently with no symptoms of angina. We will restart aspirin 81 mg daily. We did discuss restarting her statin. We will restart this in follow-up.

## 2014-09-14 NOTE — Assessment & Plan Note (Signed)
Followed by vascular surgery. Recent embolization of AV fistula leading to complications in August 2015

## 2014-09-14 NOTE — Assessment & Plan Note (Signed)
Recommended she change her metoprolol to 50 mg twice a day, continue isosorbide Losartan and amlodipine on hold for now

## 2014-12-15 ENCOUNTER — Ambulatory Visit (INDEPENDENT_AMBULATORY_CARE_PROVIDER_SITE_OTHER): Payer: Medicare PPO | Admitting: Cardiovascular Disease

## 2014-12-15 ENCOUNTER — Encounter: Payer: Self-pay | Admitting: Cardiovascular Disease

## 2014-12-15 VITALS — BP 150/70 | HR 81 | Ht 64.0 in | Wt 161.8 lb

## 2014-12-15 DIAGNOSIS — K5901 Slow transit constipation: Secondary | ICD-10-CM

## 2014-12-15 DIAGNOSIS — E785 Hyperlipidemia, unspecified: Secondary | ICD-10-CM

## 2014-12-15 DIAGNOSIS — R079 Chest pain, unspecified: Secondary | ICD-10-CM

## 2014-12-15 DIAGNOSIS — R0602 Shortness of breath: Secondary | ICD-10-CM

## 2014-12-15 DIAGNOSIS — I10 Essential (primary) hypertension: Secondary | ICD-10-CM

## 2014-12-15 DIAGNOSIS — I5032 Chronic diastolic (congestive) heart failure: Secondary | ICD-10-CM

## 2014-12-15 DIAGNOSIS — I2510 Atherosclerotic heart disease of native coronary artery without angina pectoris: Secondary | ICD-10-CM

## 2014-12-15 DIAGNOSIS — Z951 Presence of aortocoronary bypass graft: Secondary | ICD-10-CM

## 2014-12-15 DIAGNOSIS — E118 Type 2 diabetes mellitus with unspecified complications: Secondary | ICD-10-CM

## 2014-12-15 NOTE — Patient Instructions (Signed)
You are doing well.  Please take Miralax every other day Please wear compression hose on your right leg  Please do fasting labs at your convenience  Please call us if you have new issues that need to be addressed before your next appt.  Your physician wants you to follow-up in: 6 months.  You will receive a reminder letter in the mail two months in advance. If you don't receive a letter, please call our office to schedule the follow-up appointment.

## 2014-12-15 NOTE — Assessment & Plan Note (Signed)
Currently with no symptoms of angina. No further testing ordered.

## 2014-12-15 NOTE — Assessment & Plan Note (Signed)
We have ordered may relax every other day for constipation

## 2014-12-15 NOTE — Assessment & Plan Note (Signed)
We have recommended she take torsemide 20 mg with potassium 10 mEq 3 times per week for weight over 162 pounds. Weight today is up to 161 pounds

## 2014-12-15 NOTE — Assessment & Plan Note (Signed)
Currently with no symptoms of angina. No further workup at this time. Continue current medication regimen.  unclear why she is not on a statin. We will check her lipid panel

## 2014-12-15 NOTE — Assessment & Plan Note (Signed)
Recent weight gain. Recommended she modify her diet. She is unable to exercise

## 2014-12-15 NOTE — Progress Notes (Signed)
Patient ID: [name] DunningsBeatrice B [name], female    DOB: 1925-08-17, 81 y.o.   MRN: [id number]  HPI Comments: [name] [name] is a very pleasant 81 year old female with past medical history of coronary artery disease, bypass surgery, hypertension, diabetes, chronic lower extremity edema, Pulmonary hypertension (moderate), hyperlipidemia, peripheral vascular disease. She presents for routine follow-up of her coronary artery disease.  In follow-up today, weight is up 5 pounds to 161 in the past 3 months. Family reports she has been eating more, lots of milk shakes. Belly is getting bigger. She has chronic right lower extremity edema. She feels this is unchanged. No significant edema in the left lower extremity. Family also reports she has problems with constipation. She is not doing much physical therapy, only restarted recently. She denies any significant chest pain, no PND or orthopnea. Notes from her nursing home show she is receiving torsemide Monday Wednesday Friday for weight more than 162 pounds.  EKG on today's visit shows normal sinus rhythm with rate 81 bpm, nonspecific ST abnormality  hospitalization  in August 2015.  She had embolization of AV fistula of tibial vessels [facility]with significant procedure blood loss, hemoglobin into the 5 range. She was not transfused as she was a Jehovah witness. In this setting, she had an MI. Echocardiogram showed essentially normal LV function with inferior wall hypokinesis. Plan was made to manage medically Notes also indicate she had acute respiratory failure, acute kidney injury She denies any significant shortness of breath. She is not walking very much, presents in a wheelchair.   Echocardiogram  06/28/2014 showing ejection fraction 65-70%, entire inferior wall with hypokinesis, moderately dilated left atrium  Other past medical history  [facility], [facility] with hypotension and bradycardia.  She was found to be in a  junctional rhythm with heart rate of 46 beats per minute, systolic blood pressure of 80. Her Cardizem was held with improvement of her heart rate and blood pressure.  Baseline creatinine 1.5, elevated liver enzymes.  Echocardiogram December 01, 2011 shows normal systolic function, ejection fraction greater than 55%, mild LVH, mild TR, moderate right ventricular systolic pressures. CT scan of the carotid arteries shows calcification  Allergies  Allergen Reactions  . Acetaminophen     Upset stomach  . Codeine     Outpatient Encounter Prescriptions as of 12/15/2014  Medication Sig  . allopurinol (ZYLOPRIM) 100 MG tablet Take 200 mg by mouth daily.   Marland Kitchen. aspirin 81 MG tablet Take 81 mg by mouth daily.  Marland Kitchen. gabapentin (NEURONTIN) 300 MG capsule Take 300 mg by mouth 3 (three) times daily.  Marland Kitchen. HYDROcodone-acetaminophen (NORCO/VICODIN) 5-325 MG per tablet Take 1 tablet by mouth every 6 (six) hours as needed for moderate pain.  Marland Kitchen. insulin aspart (NOVOLOG) 100 UNIT/ML injection Sliding scale  . iron polysaccharides (NIFEREX) 150 MG capsule Take 150 mg by mouth daily.  . isosorbide mononitrate (IMDUR) 30 MG 24 hr tablet TAKE ONE TABLET BY MOUTH EVERY DAY  . LEVEMIR 100 UNIT/ML injection Inject 40 Units into the skin daily.   Marland Kitchen. levothyroxine (SYNTHROID, LEVOTHROID) 125 MCG tablet Take 125 mcg by mouth daily before breakfast.  . LORazepam (ATIVAN) 0.5 MG tablet Take 0.5 mg by mouth every 6 (six) hours as needed for anxiety.  . metoprolol tartrate (LOPRESSOR) 25 MG tablet Take 25 mg by mouth every 6 (six) hours.  . nitroGLYCERIN (NITROSTAT) 0.4 MG SL tablet Place 0.4 mg under the tongue every 5 (five) minutes as needed for  chest pain.  [name] DELICA LANCETS 33G MISC daily.   [name] VERIO test strip daily.   . potassium chloride (K-DUR) 10 MEQ tablet Take one tablet as directed when taking Torsemide.  . torsemide (DEMADEX) 20 MG tablet Take one tablet on Monday, Wednesday and Friday or as needed with  weight gain.  . potassium chloride (K-DUR) 10 MEQ tablet 10 mEq once daily.    Past Medical History  Diagnosis Date  . Hypertension   . Diabetes mellitus   . Hyperlipidemia   . Hypothyroidism   . PVD (peripheral vascular disease)   . Coronary artery disease     a. s/p CABG in [place] - late 90's/early 2000's;  b. s/p stenting  . Liver cyst   . Blood clot of neck vein   . Chronic diastolic CHF (congestive heart failure)     a. December 01, 2011 shows normal systolic function, ejection fraction greater than 55%, mild LVH, mild TR, moderate right ventricular systolic pressures.  Marland Kitchen DVT (deep venous thrombosis)     a. RLE - chronic xarelto.    Past Surgical History  Procedure Laterality Date  . Angioplasty      right leg  . Thyroidectomy    . Cardiac catheterization    . Coronary artery bypass graft  1999    Social History  reports that she quit smoking about 16 years ago. Her smoking use included Cigarettes. She smoked 1.00 pack per day. She does not have any smokeless tobacco history on file. She reports that she does not drink alcohol or use illicit drugs.  Family History Family history is unknown by patient.  Review of Systems  Constitutional: Negative.   Respiratory: Negative.   Cardiovascular: Positive for leg swelling.       Right leg swelling  Gastrointestinal: Negative.   Musculoskeletal: Positive for back pain, arthralgias and gait problem.  Neurological: Negative.   All other systems reviewed and are negative.   BP 150/70 mmHg  Pulse 81  Ht  (1.626 m)  Wt 161 lb 12 oz (73.369 kg)  BMI 27.75 kg/m2  Physical Exam  Constitutional: She is oriented to person, place, and time. She appears well-developed and well-nourished.  HENT:  Head: Normocephalic.  Nose: Nose normal.  Mouth/Throat: Oropharynx is clear and moist.  Eyes: Conjunctivae are normal. Pupils are equal, round, and reactive to light.  Neck: Normal range of motion. Neck supple. No JVD present.   Cardiovascular: Normal rate, regular rhythm, S1 normal, S2 normal, normal heart sounds and intact distal pulses.  Exam reveals no gallop and no friction rub.   No murmur heard. Right lower extremity edema, trace to 1+ pitting  Pulmonary/Chest: Effort normal and breath sounds normal. No respiratory distress. She has no wheezes. She has no rales. She exhibits no tenderness.  Abdominal: Soft. Bowel sounds are normal. She exhibits no distension. There is no tenderness.  Musculoskeletal: Normal range of motion. She exhibits edema. She exhibits no tenderness.  Lymphadenopathy:    She has no cervical adenopathy.  Neurological: She is alert and oriented to person, place, and time. Coordination normal.  Skin: Skin is warm and dry. No rash noted. No erythema.  Psychiatric: She has a normal mood and affect. Her behavior is normal. Judgment and thought content normal.    Assessment and Plan  Nursing note and vitals reviewed.

## 2014-12-15 NOTE — Assessment & Plan Note (Signed)
We've placed an order for fasting lab work. Prior cholesterol 331 in 2013

## 2015-03-03 NOTE — Consult Note (Signed)
Chief Complaint:  Subjective/Chief Complaint Still c/o chest pain. BP has improved but hgb 6.   VITAL SIGNS/ANCILLARY NOTES: **Vital Signs.:   19-Aug-15 07:52  Vital Signs Type Routine  Temperature Temperature (F) 99  Celsius 37.2  Temperature Source oral  Pulse Pulse 99  Respirations Respirations 16  Systolic BP Systolic BP 485  Diastolic BP (mmHg) Diastolic BP (mmHg) 43  Mean BP 68  Pulse Ox % Pulse Ox % 100  Oxygen Delivery 2L   Brief Assessment:  GEN critically ill appearing   Cardiac Regular  no murmur   Respiratory normal resp effort  clear BS   Gastrointestinal Normal   Gastrointestinal details normal Nontender   EXTR negative edema, warm well perfused   Lab Results: Routine Chem:  19-Aug-15 06:47   Glucose, Serum  296  BUN  34  Creatinine (comp)  1.93  Sodium, Serum 143  Potassium, Serum 4.1  Chloride, Serum 105  CO2, Serum 30  Calcium (Total), Serum  7.1  Anion Gap 8  Osmolality (calc) 304  eGFR (African American)  27  eGFR (Non-African American)  23 (eGFR values <8m/min/1.73 m2 may be an indication of chronic kidney disease (CKD). Calculated eGFR is useful in patients with stable renal function. The eGFR calculation will not be reliable in acutely ill patients when serum creatinine is changing rapidly. It is not useful in  patients on dialysis. The eGFR calculation may not be applicable to patients at the low and high extremes of body sizes, pregnant women, and vegetarians.)  Routine Hem:  19-Aug-15 06:47   WBC (CBC)  11.8  RBC (CBC)  2.04  Hemoglobin (CBC)  6.2  Hematocrit (CBC)  19.8  Platelet Count (CBC) 227  MCV 97  MCH 30.5  MCHC  31.4  RDW  14.8  Neutrophil % 66.5  Lymphocyte % 24.4  Monocyte % 7.8  Eosinophil % 0.6  Basophil % 0.7  Neutrophil #  7.8  Lymphocyte # 2.9  Monocyte # 0.9  Eosinophil # 0.1  Basophil # 0.1 (Result(Kewon Statler) reported on 28 Jun 2014 at 07:07AM.)   Assessment/Plan:  Assessment/Plan:  Assessment PAD-sp  pci Acute blood loss anemia-Jehovah'Irma Delancey witness precludeds prbc transfusion Acute STEMI-ct conservative tx per cardiology, awaits echo Hypotension-ct levophed, titrate to MAP of 65, consider hetastarch for plasma expansion H/o DVT-xarelto held due to bleeding Ethics-poor prognosis with high risk of cardiac arrest, now DNR palliative care consult pending.   Electronic Signatures: TVeverly Fells(MD)  (Signed 19-Aug-15 13:37)  Authored: Chief Complaint, VITAL SIGNS/ANCILLARY NOTES, Brief Assessment, Lab Results, Assessment/Plan   Last Updated: 19-Aug-15 13:37 by TVeverly Fells(MD)

## 2015-03-03 NOTE — Op Note (Signed)
PATIENT NAME:  [name] [name], [name] [name] MR#:  [id number] DATE OF BIRTH:  [birth year]   PREOPERATIVE DIAGNOSES:  1.  Atherosclerotic occlusive disease, bilateral lower extremities, with ulceration of the right lower extremities.  2.  Arteriovenous fistula of the tibial vessels, right lower extremity.   POSTOPERATIVE DIAGNOSES: 1.  Atherosclerotic occlusive disease, bilateral lower extremities, with ulceration of the right lower extremities.  2.  Arteriovenous fistula of the tibial vessels, right lower extremity.   DESCRIPTION OF PROCEDURE:  1.  Right lower extremity distal runoff, 3rd order catheter placement.  2.  Additional 3rd order catheter placement, right lower extremity with selection of the anterior tibial artery.  3.  Additional 3rd order catheter placement, right lower extremity with selection of the profunda femoris.  4.  Percutaneous transluminal angioplasty of the peroneal artery to 3 mm, right side.  5.  Percutaneous transluminal angioplasty of the popliteal and superficial femoral artery to 5 mm, right side.  6.  Percutaneous transluminal angioplasty to 4 mm of the profunda femoris artery right side.  7.  Coil embolization of the arteriovenous fistula right tibial vessels.   SURGEON: Renford Dills, M.D.   SEDATION:  Versed plus fentanyl. Continuous ECG, pulse oximetry and cardiopulmonary monitoring is performed throughout the entire procedure by the interventional radiology nurse.   TOTAL SEDATION TIME:  Two hours, 30 minutes approximately.   ACCESS: Left common femoral artery a 6 French sheath.   CONTRAST USED: Isovue 120 mL.   FLUOROSCOPY TIME: 30.2 minutes.   INDICATIONS: [name] [name] is an 81 year old woman who underwent angiography several weeks ago and was found to have several significant abnormalities including an arteriovenous fistula, which was not present on the previous studies. This was located based off of the anterior tibial artery. She also was noted to have  extensive tibial disease with single-vessel runoff via the peroneal that was noted to be heavily diseased in its proximal portion and with the leg in the AP projection, best visualized with a 40 degree LAO projection. Because at the time, significant fluoroscopy time and contrast were used, it was elected to treat the initial visit as a diagnostic procedure. She is returning today for intervention for limb salvage.   DESCRIPTION OF PROCEDURE: The patient is taken to special procedures and placed in the supine position. After adequate sedation is achieved, both groins are prepped and draped in a sterile fashion. Appropriate timeout is called.   Ultrasound is placed in a sterile sleeve. Ultrasound is utilized secondary to lack of appropriate landmarks and to avoid vascular injury. Under direct ultrasound visualization, the femoral artery is identified. The artery is noted to be echolucent and pulsatile indicating patency. Image is recorded for the permanent record. Puncture is made under direct visualization and a microwire is advanced.  Micro sheath followed by J-wire and pigtail catheter is advanced into the aorta and AP projection of the aorta and pelvis is obtained.  Using a glide catheter and a rim catheter, the aortic bifurcation is crossed and the catheter and wire are negotiated down into the SFA. Rim catheter is exchanged for a pigtail catheter, and initially an RAO projection of the groin is obtained, and then distal runoff is obtained.  Stiff angled Glidewire was reintroduced. Pigtail catheter is removed, 4000 units of heparin is given and a 6 [place] is advanced up and over the bifurcation and positioned with its tip in the SFA. Using a combination of straight catheter and angled catheters as well as a variety of wires,  including a Versacore, stiff angled Glide, floppy glide, and gold-tipped glide, ultimately, the catheter wire combination is negotiated into the peroneal and distal runoff is  obtained by hand injection, representing 3rd order catheter placement. A 3 mm tibial balloon is then advanced over the wire, and angioplasty of the proximal 1/3 of the peroneal, including the popliteal is performed. Following imaging, demonstrates there is a residual area within the peroneal and subsequently a 3 x 4 balloon is advanced over this particular area within the peroneal, inflated to 12 atmospheres for several minutes. Within the popliteal, initially a 4 x 15 Lutonix, subsequently a 5 x 20 Ultraverse, and then ultimately an additional 5 x 10 Lutonix are used to angioplasty the popliteal throughout its entirety as well as the SFA to its proximal 1/3. Followup imaging then demonstrated there is 1 area of residual stenosis, but this is moderate approximately 30% and heavily calcified and I did not believe that stenting was necessary. The popliteal is now smooth and widely patent, as is the peroneal, with single-vessel runoff preserved.   Angle catheter and glide gold-tipped glide are then reintroduced and negotiated into the anterior tibial and then into this branch leading directly to the AV fistula. The catheter is then advanced down to the origin of the AV fistula into 6 x 3 coils are placed. Followup imaging demonstrates the fistula is now completely shut down.   The sheath is then pulled back into the external iliac and wire is negotiated into the profunda.  The critical stenosis is crossed here and initially a 3 x 8 balloon and then a 4 x 6 Lutonix balloon are used to angioplasty the profunda femoris. Followup imaging demonstrates the profunda is now widely patent.   The sheath is then pulled into the external iliac on the left. Oblique view of the left groin is obtained and a StarClose device deployed without difficulty.   INTERPRETATION: Initial views of the distal aorta and iliacs demonstrates they are patent as previously noted.  Stents in the proximal iliacs are in good position  bilaterally.   The common femoral and right is widely patent. The profunda femoris on the right demonstrates 3-4 cm string sign/subtotal occlusion. SFA is patent down to its midportion where there is a greater than 60% stenosis. Distally, there is diffuse disease and the popliteal demonstrates lengthy lesion with multiple subtotal occlusions. There is single-vessel runoff via the peroneal, which demonstrates some hemodynamically significant disease in its proximal 1/3. The arteriovenous fistula is again noted.   Following angioplasty of the peroneal to 3 mm maximal diameter, as well as angioplasty of the popliteal and SFA to 5 mm using the Lutonix system in addition to angioplasty of the profunda femoris to 4 mm using a Lutonix balloon, there is now resolution of the lesions. The AV fistula is also now shutdown after placing 2 6 mm coils.   Of note, following the procedure while the patient was in recovery, it was noticed that she was having significant hemorrhage, which appeared initially to be vaginal but, ultimately, upon inspection, and then subsequently placement of a Foley catheter turned out to be hematuria. Stat  urology consult was obtained, as well as stat critical care consult and CT scan suggested bleeding from the pelvis of the ureter. The patient was resuscitated and transferred to the intensive care unit in guarded condition.    ____________________________ Renford Dills, MD ggs:DT D: 06/27/2014 19:15:58 ET T: 06/27/2014 20:16:33 ET JOB#: [id number]  cc: Renford Dills,  MD, <Dictator> Renford DillsGREGORY G Anjel Pardo MD ELECTRONICALLY SIGNED 07/18/2014 22:39

## 2015-03-03 NOTE — Consult Note (Signed)
Chief Complaint:  Subjective/Chief Complaint Severe chest pain this AM. Evidence of MI.  Hematuria minimal, on slow gtt CBI throughout day with minimal clots. Hct19, no further labs at this time as refuses transfusion.  Weaned off levophed.  Remains in ICU, critically ill.   VITAL SIGNS/ANCILLARY NOTES: **Vital Signs.:   19-Aug-15 17:09  Vital Signs Type Routine  Temperature Source oral  Pulse Pulse 104  Respirations Respirations 20  Systolic BP Systolic BP 741  Diastolic BP (mmHg) Diastolic BP (mmHg) 56  Mean BP 71  Pulse Ox % Pulse Ox % 100  Oxygen Delivery 4L  *Intake and Output.:   Shift 19-Aug-15 23:00  Grand Totals Intake:  400 Output:  400    Net:  0 24 Hr.:  -862  IV (Primary)      In:  0  IV (Primary)      In:  400  Urine ml     Out:  400  Length of Stay Totals Intake:  16765.5 Output:  19900    Net:  -3134.5   Brief Assessment:  GEN critically ill appearing, in ICU, Alert and oriented, responds appropriately   Respiratory normal resp effort   Gastrointestinal Normal   Gastrointestinal details normal Soft  Nontender  Nondistended   EXTR positive edema   Additional Physical Exam GU- 24 Fr 3 way Foley in place on slow gtt CBI, urine very light pink without clots   Lab Results: Routine Chem:  19-Aug-15 06:47   Result Comment TROPONIN - RESULTS VERIFIED BY REPEAT TESTING.  - C/NICKI [id number]  - READ-BACK PROCESS PERFORMED.  Result(s) reported on 28 Jun 2014 at 03:00PM.  Glucose, Serum  296  BUN  34  Creatinine (comp)  1.93  Sodium, Serum 143  Potassium, Serum 4.1  Chloride, Serum 105  CO2, Serum 30  Calcium (Total), Serum  7.1  Anion Gap 8  Osmolality (calc) 304  eGFR (African American)  27  eGFR (Non-African American)  23 (eGFR values <42m/min/1.73 m2 may be an indication of chronic kidney disease (CKD). Calculated eGFR is useful in patients with stable renal function. The eGFR calculation will not be reliable in acutely ill  patients when serum creatinine is changing rapidly. It is not useful in  patients on dialysis. The eGFR calculation may not be applicable to patients at the low and high extremes of body sizes, pregnant women, and vegetarians.)  Cardiac:  19-Aug-15 06:47   Troponin I  0.57 (0.00-0.05 0.05 ng/mL or less: NEGATIVE  Repeat testing in 3-6 hrs  if clinically indicated. >0.05 ng/mL: POTENTIAL  MYOCARDIAL INJURY. Repeat  testing in 3-6 hrs if  clinically indicated. NOTE: An increase or decrease  of 30% or more on serial  testing suggests a  clinically important change)  CK, Total 46 (26-192 NOTE: NEW REFERENCE RANGE  12/12/2013)  CPK-MB, Serum 2.1 (Result(s) reported on 28 Jun 2014 at 02:53PM.)  Routine Hem:  19-Aug-15 06:47   WBC (CBC)  11.8  RBC (CBC)  2.04  Hemoglobin (CBC)  6.2  Hematocrit (CBC)  19.8  Platelet Count (CBC) 227  MCV 97  MCH 30.5  MCHC  31.4  RDW  14.8  Neutrophil % 66.5  Lymphocyte % 24.4  Monocyte % 7.8  Eosinophil % 0.6  Basophil % 0.7  Neutrophil #  7.8  Lymphocyte # 2.9  Monocyte # 0.9  Eosinophil # 0.1  Basophil # 0.1 (Result(s) reported on 28 Jun 2014 at 07:07AM.)   Assessment/Plan:  Assessment/Plan:  Assessment CAD/ MI  Acute blood loss anemia  Hematuria Shock Jahova's witness Critical condition- DNR   Plan 1. Continue CBI overnight 2. May stop gtt in AM in urine remains clear 3. Maintain Foley while critically ill 4. Will need outpatient f/u for CT Urogram, office cystoscopy  Will see again tomorrow.  Please call our office with questions or concerns.  Hollice Espy, MD [facility]   Electronic Signatures: Sherlynn Stalls (MD)  (Signed 19-Aug-15 17:48)  Authored: Chief Complaint, VITAL SIGNS/ANCILLARY NOTES, Brief Assessment, Lab Results, Assessment/Plan   Last Updated: 19-Aug-15 17:48 by Sherlynn Stalls (MD)

## 2015-03-03 NOTE — Discharge Summary (Signed)
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   DISCHARGE DIAGNOSES:  1.  Peripheral vascular disease status post coil embolization. 2.  History of tibial vessels. 3.  Acute inferior ST elevation myocardial infarction.  4.  Acute respiratory failure.  5.  Acute kidney injury.  6.  Acute blood loss anemia.  7.  Jehovah Witness.   PROCEDURE: Coil embolization on 06/27/2014 by Dr. Gilda CreaseSchnier.   CONSULTATIONS: Cardiology, Dr. Adrian BlackwaterShaukat Khan; pulmonology, Dr. Belia HemanKasa; nephrology Drs. Beryl MeagerLateef and Singh; palliative care, Dr. Harvie JuniorPhifer; hematology, Dr. Benita Gutterobert Gittin.   IMAGING: CT scan of the head on 07/05/2014 normal.  Chest x-rays 07/03/2014.    HISTORY OF PRESENT ILLNESS:This elderly who is a Jehovah'[name] [name] [name] witness was admitted for an elective procedure, however following her embolization by vascular, she had profuse bleeding with a marked drop in her hemoglobin to around 6.   The patient'[name] [name] [name] religious beliefs are against blood transfusions so she could not be transfused packed red cells.  She was then admitted to the Intensive Care Unit.   HOSPITAL COURSE: The patient developed acute ST elevation myocardial infarction following her acute blood loss anemia with hypotension, tachycardia.  Cardiology consultation was placed with Dr. Adrian BlackwaterShaukat Khan who recommended conservative management due to the fact that the patient could not receive some blood clots in the event of any complications for left heart catheterization or peripheral percutaneous intervention.  The patient received crystalloids for plasma expansion. Her blood pressure gradually improved and normalized.  Her tachycardia resolved. Hematology was consulted for  possible erythropoietin or iron infusion therapy.  Dr. [name]'[name] recommendation was that due to the close proximity of an acute thrombotic event, it was not  advisable to administer Procrit due to the fact that it is pro-thrombotic and recommended to address this issue once the patient was further removed from her acute myocardial infarction.  The patient'[name] [name] [name] hemoglobin plateaued around 5.1.  She remained generally weak and lethargic, but did not exhibit any other cardiac complications.  The patient received a large amount of intravenous fluids and subsequently required intravenous Lasix for treatment of pulmonary edema.  The patient case was also complicated by acute kidney injury that was treated with fluids.  Nephrology consulted and her creatinine improved to her baseline of 1.25 by the time of discharge.  The patient also had acute respiratory failure.  She was tapered down to 2 liters of oxygen.  However, she did require mechanical ventilation during this stay.  Details of the patient'[name] [name] [name] multiple comorbidities, after discussion with the family, she was made a DO NOT RESUSCITATE and palliative care also discussed with the family and the patient goals of care. The patient did undergo physical therapy, however, she remains quite weak and the recommendation is to continue physical therapy at a skilled nursing facility. She is being discharged to skilled facility in satisfactory condition.   DIET: Low fat, low sodium.   ACTIVITY:  As tolerated.    FOLLOW-UP: With Dr. Adrian BlackwaterShaukat Khan in 1 to 2 weeks, and Dr. Benita Gutterobert Gittin in 1 to 2 weeks, Dr. Gilda CreaseSchnier in 1 to 2 weeks.   DISCHARGE MEDICATIONS:  1.  Amlodipine 5 mg daily. 2.  Torsemide 20 mg 2 tabs [name] [name]  3.  Allopurinol 100 mg 2 tabs daily.  4.  Potassium chloride 10 mEq daily.  5.  Levothyroxine 125 mcg daily.  6.  Levemir 75 units every a.m.  45 units every p.m.  7.  Gabapentin 300 mg t.i.d.  8.  Vicodin 5/325 one p.r.n. every 6. 9.  Enalapril 2.5 mg  daily.  10. Isosorbide mononitrate 30 mg daily.  11. Dronabinol 2.5 mg [name] [name]  12. Metoprolol tartrate 25 mg every 6 hours.  13. Glucerna shake 480 mL t.i.d. with meals.  14. Iron 150. 15. Vitamin B12 and vitamins 1 tab daily.  16. Oxygen 2 liters nasal cannula.   DISCHARGE PROCESS TIME SPENT: 35 minutes.     ____________________________ [name], MD sat:DT D: 07/06/2014 09:29:21 ET T: 07/06/2014 16:54:15 ET JOB#: [id number]  cc: Sheikh A. Ellsworth Lennox, MD, <Dictator> Charlesetta Garibaldi MD ELECTRONICALLY SIGNED 07/18/2014 14:10

## 2015-03-03 NOTE — H&P (Signed)
PATIENT NAME:  [name] [name], [name] [name] MR#:  [id number] DATE OF BIRTH:  11-Jan-1925  DATE OF ADMISSION:  04/29/2014  REFERRING PHYSICIAN: Dr. Karma Greaser   PRIMARY CARE PHYSICIAN:    PRIMARY CARDIOLOGIST: Dr. Rockey Situ   CHIEF COMPLAINT:  Fever, confusion.   HISTORY OF PRESENT ILLNESS: The patient is a pleasant, 81 year old female with a history of, diabetes, history of recent DVT, who was on Coumadin, now Xarelto, with chronic right lower extremity edema. Of note, patient has had chronic swelling of the right lower extremity for several months, and some even before then. She was diagnosed with a DVT, was started on Coumadin, which was switched to Xarelto for about a month or so. Then about a couple of days ago, she started to have worsening swelling, without any drainage, but she started to have fevers, pain, and the area was red. The patient felt like she had a gout flare in back of her leg, and had some difficulty walking because of the swelling. She took some steroids the last couple of days. However, it did not improve, and  she came into the hospital after she was having fevers for a couple of days. In the ER, she had a fever, was tachycardic, and have white count of 17, with cellulitic right lower extremity, and therefore was started on IV antibiotics, and hospitalist services were contacted for further evaluation and management.   PAST MEDICAL HISTORY: Diabetes, insulin-dependent; CAD, status post CABG; DVT, hypothyroidism, hyperlipidemia, peripheral vascular disease, hypertension, angioplasty of the right lower extremity.  SURGERIES:  Bypass surgery, cholecystectomy, thyroid surgery.   ALLERGIES: CODEINE and ACETAMINOPHEN which might be more of intolerances, gives her GI upset.   OUTPATIENT MEDICATIONS: Allopurinol 100 mg 2 tabs once a day, amlodipine 5 mg daily, aspirin 81 mg daily, atenolol 100 mg daily, fish oil 1000 mg daily, gingko biloba 1 tab once a day, isosorbide mononitrate 30 mg  extended-release once a day, Levemir 75 units in the morning and 45 units at bedtime, levothyroxine 125 mcg daily, losartan 50 mg daily, Lyrica 50 mg 3 times a day, Solu-Medrol dose pack 4 mg taper, potassium chloride 10 mEq once a day, simvastatin 40 mg daily, torsemide 20 mg 2 tabs 2 times a day, Victoza 1.8 mg daily, Xarelto 20 mg daily.   SOCIAL HISTORY: Quit tobacco multiple years ago. No alcohol or drug use. Lives with her granddaughter.   FAMILY HISTORY: Brother with MI. There is breast cancer and lymphoma running in the family.  REVIEW OF SYSTEMS:   CONSTITUTIONAL: Positive for fever. No weight changes.  EYES: Has some blurry vision. ENT: No tinnitus or hearing loss.  RESPIRATORY: No cough, wheezing, or shortness of breath.  CARDIOVASCULAR: No pains in the chest. Has chronic swelling in the leg on the right. Had some chest pain a couple of days ago, went to see Dr. Rockey Situ.  GASTROINTESTINAL: No nausea, vomiting, diarrhea, or abdominal pain. No black or tarry stools.  RESPIRATORY: No cough or shortness of breath or wheezing.  GENITOURINARY: No dysuria or hematuria.  HEMATOLOGIC: High [place] denies anemia or easy bruising.  SKIN: Rash as above.  NEUROLOGIC: No focal weakness or numbness. Is having some confusion the last day.  PSYCHIATRIC: Denies anxiety or insomnia.   PHYSICAL EXAMINATION: VITAL SIGNS: Temperature on arrival 101.2, pulse rate 112, respiratory rate 20, blood pressure 130/61, O2 sat 94% on room air.  GENERAL: The patient is an obese female lying in bed, no obvious distress.  HEENT: Normocephalic, atraumatic. Pupils are equal and  reactive. Likes to keep her right eye closed. Moist mucous membranes.  NECK: Supple. No thyroid tenderness. No cervical lymphadenopathy.  CARDIOVASCULAR: S1, S2, tachycardic. No significant murmurs, rubs or gallops.  LUNGS: Clear to auscultation without wheezing, rhonchi, or rales.  ABDOMEN: Soft, nontender, nondistended. Positive bowel sounds  all quadrants.   EXTREMITIES: Right lower extremity 3+ edema, erythema and edema all below the knee with pockets of erythema. It is tender and it is hot compared to the left lower extremity. NEUROLOGIC:  Cranial nerves II through XII appear to be grossly intact. Strength is 5/5. Moving all extremities spontaneously. PSYCHIATRIC:  Awake, alert, oriented x 2. Slow to respond, a little lethargic.  LABS AND IMAGING:  White count of 17.2. Lactic acid of 3.4, INR of 1.2, hemoglobin 13.4, platelets 244, bilirubin 1.6, alk phos 118, AST and ALT within normal limits, BUN 26, creatinine 1.29, sodium 137, potassium 4.1. Chest x-ray, portable: Cardiomegaly, without acute findings.   ASSESSMENT AND PLAN: We have a pleasant, 81 year old with multiple comorbidities and chronic lower extremity edema on the right, with history of deep vein thrombosis, who comes in with presumed sepsis with tachycardia, fever, leukocytosis, and cellulitis. I do not know if  patient also has underlying DVT, but the area of the lower extremity appears tender, erythematous and cellulitic, and she has received antibiotics.  At this point, as she is diabetic, would continue Vanc and Zosyn. Blood cultures have been obtained. I would also check an ultrasound of the lower extremity, but she is already on Xarelto and the possibility of a DVT is there, but small. It could be that this is a recurring DVT on Xarelto. Therefore checking the ultrasound as appropriate. I would start the patient on some IV fluids. The patient also appears to have a little bit of metabolic encephalopathy due to sepsis, possibly. The patient was a little lethargic and slow to respond. Per family, she is a little bit disoriented as well. She follows commands and is neurologically intact at this point. Would follow with the cultures. We would hold some of her blood pressure medications as well as her insulin, start her on some sliding scale insulin. For deep vein thrombosis  prophylaxis, she is on Xarelto.   Total Time Spent: 45 minutes.   CODE STATUS: The patient is full code.    ____________________________ Vivien Presto, MD sa:mr D: 04/29/2014 20:22:49 ET T: 04/29/2014 21:15:49 ET JOB#: [id number]  cc: Vivien Presto, MD, <Dictator> [facility]  Karel Jarvis [facility] MD ELECTRONICALLY SIGNED 05/10/2014 14:00

## 2015-03-03 NOTE — Consult Note (Signed)
   Comments   Discussed pt with Dr Gilda CreaseSchnier. Chart reviewed. Pt now a DNR. Will see pt if needed.  Electronic Signatures: Makailey Hodgkin, Harriett SineNancy (MD)  (Signed 19-Aug-15 15:08)  Authored: Palliative Care   Last Updated: 19-Aug-15 15:08 by Vonne Mcdanel, Harriett SineNancy (MD)

## 2015-03-03 NOTE — Consult Note (Signed)
CHIEF COMPLAINT and HISTORY:  Subjective/Chief Complaint RLE pain and swelling   History of Present Illness Patient is admitted with multiple issues, most noticeable for her is the pain and swelling in the right leg.  This has been progressing over months.  By report, the cellulitis has improved some with IV ABx.  Her swelling and tenderness are still prominent.  She had not trauma or injury to the leg that she remembers.  Has had treatment for PAD to both legs with percutaneous arterial intervention in 2010 on the left leg and in 2013 on the right leg, most recently by Dr. Delana Meyer.  Do not have office records currently available.  Left leg with mild swelling.  Has been elevating her leg for relief here in hospital.   PAST MEDICAL/SURGICAL HISTORY:  Past Medical History:   Gout:    Hypercholesterolemia:    HTN:    MI - Myocardial Infarct:    CAD:    Diabetes Mellitus, Type II (NIDD):    Hysterectomy - Partial:    Thyroidectomy:    CABG (Coronary Artery Bypass Graft):   ALLERGIES:  Allergies:  Codeine: N/V  Acetaminophen: GI Distress  HOME MEDICATIONS:  Home Medications: Medication Instructions Status  Aspirin Enteric Coated 81 mg oral delayed release tablet 1 tab(s) orally once a day Active  MethylPREDNISolone Dose Pack 4 mg oral tablet take orally as directed for 6 days. Active  Victoza 18 mg/3 mL subcutaneous solution 1.8 milligram(s) subcutaneous once a day Active  torsemide 20 mg oral tablet 2 tabs (19m) orally 2 times a day Active  simvastatin 40 mg oral tablet 1 tab(s) orally once a day Active  allopurinol 100 mg oral tablet 2 tabs (2043m orally once a day with a meal as directed. Active  Gingko Biloba - oral tablet 1 tab (120 milligrams) orally once a day Active  losartan 50 mg oral tablet 1 tab(s) orally once a day Active  potassium chloride 10 mEq oral tablet, extended release 1 tab(s) orally once a day Active  levothyroxine 125 mcg (0.125 mg) oral tablet 1  tab(s) orally once a day Active  Xarelto 20 mg oral tablet 1 tab(s) orally once a day Active  isosorbide mononitrate 30 mg oral tablet, extended release 1 tab(s) orally once a day Active  Levemir 100 units/mL subcutaneous solution 75 unit(s) subcutaneous once a day (in the morning) and 45 units subcutaneous once a day (in the evening) Active  Lyrica 50 mg oral capsule 1 cap(s) orally 3 times a day Active  amlodipine 5 mg oral tablet 1 tab(s) orally once a day Active  Fish Oil 1000 mg oral capsule 1 cap(s) orally once a day Active  atenolol 100 mg oral tablet 1 tab(s) orally once a day Active   Family and Social History:  Family History Non-Contributory   Social History negative tobacco, negative ETOH   Place of Living Home   Review of Systems:  Subjective/Chief Complaint right leg pain and swelling   Fever/Chills Yes   Cough No   Sputum No   Abdominal Pain No   Diarrhea No   Constipation No   Nausea/Vomiting No   SOB/DOE No   Chest Pain No   Dysuria No   Tolerating PT No   Tolerating Diet Yes   Medications/Allergies Reviewed Medications/Allergies reviewed   Physical Exam:  GEN well developed, well nourished, obese   HEENT pink conjunctivae, moist oral mucosa   NECK No masses  trachea midline   RESP normal resp effort  no use of accessory muscles   CARD regular rate  no JVD   VASCULAR ACCESS none   ABD denies tenderness  soft   GU no superpubic tenderness   LYMPH negative neck, negative axillae   EXTR negative cyanosis/clubbing, positive edema, RLE with 3+ edema present with blistering of the skin.  No open wounds but marked stasis changes present.  Left leg with 1+ edema and mild stasis changes.  Right pedal pulses difficult to feel with swelling.  Left DP and PT 1+.   SKIN tight to palpation, particularly in the right lower leg.  Blistering is present as well.   NEURO cranial nerves intact, motor/sensory function intact   PSYCH alert, A+O to  time, place, person   LABS:  Laboratory Results: LabObservation:    21-Jun-15 08:32, [place] Color Flow Doppler Lower Extrem Right (Leg)  OBSERVATION   Reason for Test Swelling    21-Jun-15 08:38, Echo Doppler  OBSERVATION   Reason for Test  Hepatic:    20-Jun-15 18:06, Comprehensive Metabolic Panel  Bilirubin, Total 1.6  Alkaline Phosphatase 118  45-117  NOTE: New Reference Range  09/30/13  SGPT (ALT) 35  SGOT (AST) 28  Total Protein, Serum 7.6  Albumin, Serum 2.7    25-Jun-15 05:11, Comprehensive Metabolic Panel  Bilirubin, Total 2.6  Alkaline Phosphatase 201  45-117  NOTE: New Reference Range  09/30/13  SGPT (ALT) 21  SGOT (AST) 20  Total Protein, Serum 5.7  Albumin, Serum 1.5  Routine Micro:    20-Jun-15 18:59, Blood Culture  Micro Text Report   BLOOD CULTURE    COMMENT                   NO GROWTH IN 48 HOURS     ANTIBIOTIC  Culture Comment   NO GROWTH IN 48 HOURS   Result(s) reported on 01 May 2014 at 07:00PM.    20-Jun-15 19:00, Blood Culture  Micro Text Report   BLOOD CULTURE    COMMENT                   NO GROWTH IN 48 HOURS     ANTIBIOTIC  Culture Comment   NO GROWTH IN 48 HOURS   Result(s) reported on 01 May 2014 at 07:00PM.    20-Jun-15 20:03, Urine Culture  Organism Name   ESCHERICHIA COLI  Organism Quantity   >100,000 CFU/ML  Nitrofurantoin Sensitivity S  Cefazolin Sensitivity S  Ampicillin Sensitivity R  Ceftriaxone Sensitivity S  Ciprofloxacin Sensitivity S  Gentamicin Sensitivity S  Imipenem Sensitivity S  Levofloxacin Sensitivity S  Trimethoprim/Sulfamethoxazole Sensitivty S  Micro Text Report   URINE CULTURE    ORGANISM 1                >100,000 CFU/ML ESCHERICHIA COLI     ANTIBIOTIC                    ORG#1      AMPICILLIN                    R          CEFAZOLIN                     S          CEFOXITIN                     S  CEFTRIAXONE                   S          CIPROFLOXACIN                 S          GENTAMICIN                     S          IMIPENEM                      S          LEVOFLOXACIN                  S          NITROFURANTOIN                S          TRIMETHOPRIM/SULFAMETHOXAZOLE S  Specimen Source   IN AND OUT CATH  Organism 1   >100,000 CFU/ML ESCHERICHIA COLI   Result(s) reported on 01 May 2014 at 11:28AM.  Culture Comment   ID TO FOLLOW SENSITIVITIES TO FOLLOW   Result(s) reported on 30 Apr 2014 at 12:06PM.  Cardiology:    20-Jun-15 20:31, ED ECG  Ventricular Rate 118  Atrial Rate 118  P-R Interval 140  QRS Duration 68  QT 312  QTc 437  P Axis 26  R Axis 34  T Axis 79  ECG interpretation   Sinus tachycardia with Premature atrial complexes  Possible Left atrial enlargement  Anterior infarct (cited on or before 05-Nov-2012)  Abnormal ECG  When compared with ECG of 05-Nov-2012 21:09,  Premature atrial complexes are now Present  Vent. ratehas increased BY  49 BPM  Nonspecific T wave abnormality no longer evident in Inferior leads  ----------unconfirmed----------  Confirmed by OVERREAD, NOT ([id number]), editor PEARSON, BARBARA ([id number]) on 05/01/2014 12:59:57 PM  ED ECG     21-Jun-15 08:38, Echo Doppler  Echo Doppler   REASON FOR EXAM:      COMMENTS:       PROCEDURE: [place] - ECHO DOPPLER COMPLETE(TRANSTHOR)  - Apr 30 2014  8:38AM     RESULT: Echocardiogram Report    Patient Name:   [name] [name] Date of Exam: 04/30/2014  Medical Rec #:  [id number]            Custom1:  Date of Birth:  12/11/1924         Height:       64.0 in  Patient Age:    81 years          Weight:       185.0 lb  Patient Gender: F                 BSA:          1.89 m??    Indications: Angina  Sonographer:    Arville Go RDCS  Referring Phys: TEJAN-SIE, [place] Comments: Technically difficult study due to poor echo   windows, suboptimal apical window and suboptimal subcostal window.    Summary:   1. Left ventricular ejection fraction, by visual estimation, is 55 to   60%.   2.Normal  global left ventricular systolic function.   3. Wall motion could not be evaluated.   4. Mild concentric left ventricular hypertrophy.   5. Mildly dilated left  atrium.   6. Mildly dilated right atrium.   7. Mild to moderate aortic valve sclerosis/calcification without any   evidence of aortic stenosis.   8. Mildly elevated pulmonary artery systolic pressure.   9. Mild to moderate tricuspid regurgitation.  10. Mildly increased left ventricular posterior wall thickness.  2D AND M-MODE MEASUREMENTS (normal ranges within parentheses):  Left Ventricle:          Normal  IVSd (2D):      1.16 cm (0.7-1.1)  LVPWd (2D):     1.21 cm (0.7-1.1) Aorta/LA:                  Normal  LVIDd (2D):     2.82 cm (3.4-5.7) Aortic Root (2D): 2.20 cm (2.4-3.7)  LVIDs (2D):     2.04 cm           Left Atrium (2D): 3.20 cm (1.9-4.0)  LV FS (2D):     27.7 %   (>25%)  LV EF (2D):     55.5 %   (>50%)                                    Right Ventricle:                                    RVd (2D):  LV DIASTOLIC FUNCTION:  MV Peak E: 0.94 m/s Decel Time: 206 msec  MV Peak A: 0.95 m/s  E/A Ratio: 0.98  SPECTRAL DOPPLER ANALYSIS (where applicable):  Mitral Valve:  MV P1/2 Time: 59.74 msec  MV Area, PHT: 3.68 cm??  Aortic Valve: AoV Max Vel: 1.45 m/s AoV Peak PG: 8.4 mmHg AoV Mean PG:  LVOT Vmax: 1.15 m/s LVOT VTI:  LVOT Diameter: 2.00 cm  AoV Area, Vmax: 2.49 cm?? AoV Area, VTI:  AoV Area, Vmn:  Tricuspid Valve and PA/RV Systolic Pressure: TR Max Velocity: 2.80 m/s RA   Pressure: 10 mmHg RVSP/PASP: 41.3 mmHg    PHYSICIAN INTERPRETATION:  Left Ventricle: The left ventricular internal cavity size was normal. LV   posterior wall thickness was mildly increased. Mild concentric left     ventricular hypertrophy. Global LV systolic function was normal. Left   ventricular ejection fraction, by visual estimation, is 55 to 60%.   Spectral Doppler shows normal pattern of LV diastolic filling.  Right Ventricle: Normal right  ventricular size, wall thickness, and   systolic function.  Left Atrium: The left atrium is mildly dilated.  Right Atrium: The right atrium is mildly dilated.  Pericardium: There is no evidence of pericardial effusion.  Mitral Valve: The mitral valve is normal in structure. No evidence of   mitral valve stenosis. Trace mitral valve regurgitation is seen.  Tricuspid Valve: Mild to moderate tricuspid regurgitation is visualized.   The tricuspid regurgitant velocity is 2.80 m/s, and with an assumed right   atrial pressure of 10 mmHg, the estimated right ventricular systolic   pressure is mildly elevated at 41.3 mmHg.  Aortic Valve: The aortic valve is tricuspid. Mild to moderate aortic     valve sclerosis/calcification is present, without any evidence of aortic   stenosis. No evidence of aortic valve regurgitation is seen.  Pulmonic Valve: The pulmonic valve is not well seen.  Aorta: The aortic root is normal in size and structure.  Venous: The inferior vena cava was  normal. The inferior vena cava was   normal sized with respiratory size variation greater than 50%.    [id number] Kathlyn Sacramento MD  Electronically signed by [id number] Kathlyn Sacramento MD  Signature Date/Time: 05/01/2014/7:26:50 AM    *** Final ***    IMPRESSION: .    Verified By: Mertie Clause. ARIDA, M.D., MD    21-Jun-15 09:39, ECG  Ventricular Rate 94  Atrial Rate 94  P-R Interval 144  QRS Duration 66  QT 340  QTc 425  P Axis 36  R Axis 39  T Axis 94  ECG interpretation   Normal sinus rhythm  Possible Left atrial enlargement  Cannot rule out Anterior infarct (cited on or before 05-Nov-2012)  Abnormal ECG  When compared with ECG of 29-Apr-2014 20:31,  Questionable change in initial forces of Anterior leads  Confirmed by Fletcher Anon, MUHAMMAD ([id number]) on 05/01/2014 12:37:05 PM    Overreader: Kathlyn Sacramento  ECG   Routine Chem:    20-Jun-15 18:06, Comprehensive Metabolic Panel  Glucose, Serum 94  BUN 26  Creatinine (comp)  1.29  Sodium, Serum 137  Potassium, Serum 4.1  Chloride, Serum 103  CO2, Serum 27  Calcium (Total), Serum 9.6  Osmolality (calc) 278  eGFR (African American) 43  eGFR (Non-African American) 37  eGFR values <70m/min/1.73 m2 may be an indication of chronic  kidney disease (CKD).  Calculated eGFR is useful in patients with stable renal function.  The eGFR calculation will not be reliable in acutely ill patients  when serum creatinine is changing rapidly. It is not useful in   patients on dialysis. The eGFR calculation may not be applicable  to patients at the low and high extremes of body sizes, pregnant  women, and vegetarians.  Result Comment   POTASSIUM/AST - Slight hemolysis, interpret results with   - caution.   Result(s) reported on 29 Apr 2014 at 06:20PM.  Anion Gap 7    20-Jun-15 18:59, Magnesium, Serum  Result Comment   MAGNESIUM - SPECIMEN GROSSLY HEMOLYZED. NOTIFIED   - ANN CALES AT 2024 ON 04/29/2014 AND   - REQUESTED A NEW SAMPLE.   - LAB WILL REORDER MAGNESIUM TEST...TPL   Result(s) reported on 29 Apr 2014 at 08:27PM.  Magnesium, Serum -  1.8-2.4  THERAPEUTIC RANGE: 4-7 mg/dL  TOXIC: > 10 mg/dL   -----------------------    20-Jun-15 18:59, Phosphorus, Serum  Phosphorus, Serum 2.9  Result(s) reported on 29 Apr 2014 at 07:59PM.    20-Jun-15 18:59, Troponin I  Result Comment   TROPONIN - RESULTS VERIFIED BY REPEAT TESTING.   - CALLED TO ANN CALES AT 2008 ON 04/29/14   - BY QSD. READBACK PERFORMED...TPL   Result(s) reported on 29 Apr 2014 at 08:21PM.    20-Jun-15 21:04, Magnesium, Serum  Magnesium, Serum 1.5  1.8-2.4  THERAPEUTIC RANGE: 4-7 mg/dL  TOXIC: > 10 mg/dL   -----------------------    20-Jun-15 227:25 Basic Metabolic Panel (w/Total Calcium)  Glucose, Serum -  BUN -  Creatinine (comp) -  Sodium, Serum -  Potassium, Serum -  Chloride, Serum -  CO2, Serum -  Calcium (Total), Serum -  Anion Gap -  Osmolality (calc) -  eGFR (African American) -   eGFR (Non-African American) -  eGFR values <635mmin/1.73 m2 may be an indication of chronic  kidney disease (CKD).  Calculated eGFR is useful in patients with stable renal function.  The eGFR calculation will not be reliable in acutely ill patients  when serum creatinine is changing rapidly. It  is not useful in   patients on dialysis. The eGFR calculation may not be applicable  to patients at the low and high extremes of body sizes, pregnant  women, and vegetarians.  Result Comment   MET B - VERIFIED IN ERROR. REORDERED   Result(s) reported on 30 Apr 2014 at 04:00AM.    20-Jun-15 23:16, Hemoglobin A1c ([facility])  Hemoglobin A1c [facility]) 7.8  The American Diabetes Association recommends that a primary goal of  therapy should be <7% and that physicians should reevaluate the  treatment regimen in patients with HbA1c values consistently >8%.    20-Jun-15 23:19, Troponin I  Result Comment   TROPONIN - RESULTS VERIFIED BY REPEAT TESTING.   - ELEVATED TROPONIN PREVIOUSLY CALLED AT   - 2008 04/29/14.PMH   Result(s) reported on 30 Apr 2014 at 12:26AM.    21-Jun-15 63:14, Basic Metabolic Panel (w/Total Calcium)  Glucose, Serum 73  BUN 21  Creatinine (comp) 1.20  Sodium, Serum 144  Potassium, Serum 3.9  Chloride, Serum 110  CO2, Serum 23  Calcium (Total), Serum 7.8  Anion Gap 11  Osmolality (calc) 288  eGFR (African American) 47  eGFR (Non-African American) 41  eGFR values <95m/min/1.73 m2 may be an indication of chronic  kidney disease (CKD).  Calculated eGFR is useful in patients with stable renal function.  The eGFR calculation will not be reliable in acutely ill patients  when serum creatinine is changing rapidly. It is not useful in   patients on dialysis. The eGFR calculation may not be applicable  to patients at the low and high extremes of body sizes, pregnant  women, and vegetarians.  Result Comment   POTASSIUM - Slight hemolysis, interpret results with   - caution.    Result(s) reported on 30 Apr 2014 at 04:06AM.    21-Jun-15 03:02, Troponin I  Result Comment   TROPONIN - RESULTS VERIFIED BY REPEAT TESTING.   - PREVIOUS CALL:04/29/14 AT 2008..TPL  CHEMISTRY LABS - Slight hemolysis, interpret results with   - caution.   Result(s) reported on 30 Apr 2014 at 04:30AM.    22-Jun-15 04:06, Creatinine Serum  Creatinine (comp) 1.40  eGFR (African American) 39  eGFR (Non-African American) 34  eGFR values <646mmin/1.73 m2 may be an indication of chronic  kidney disease (CKD).  Calculated eGFR is useful in patients with stable renal function.  The eGFR calculation will not be reliable in acutely ill patients  when serum creatinine is changing rapidly. It is not useful in   patients on dialysis. The eGFR calculation may not be applicable  to patients at the low and high extremes of body sizes, pregnant  women, and vegetarians.    23-Jun-15 0497:02Basic Metabolic Panel (w/Total Calcium)  Glucose, Serum 172  BUN 27  Creatinine (comp) 1.50  Sodium, Serum 137  Potassium, Serum 3.8  Chloride, Serum 105  CO2, Serum 27  Calcium (Total), Serum 9.0  Anion Gap 5  Osmolality (calc) 283  eGFR (African American) 36  eGFR (Non-African American) 31  eGFR values <6064min/1.73 m2 may be an indication of chronic  kidney disease (CKD).  Calculated eGFR is useful in patients with stable renal function.  The eGFR calculation will not be reliable in acutely ill patients  when serum creatinine is changing rapidly. It is not useful in   patients on dialysis. The eGFR calculation may not be applicable  to patients at the low and high extremes of body sizes, pregnant  women, and vegetarians.    24-Jun-15  04:47, Creatinine Serum  Creatinine (comp) 1.26  eGFR (African American) 45  eGFR (Non-African American) 39  eGFR values <88m/min/1.73 m2 may be an indication of chronic  kidney disease (CKD).  Calculated eGFR is useful in patients with stable renal function.  The  eGFR calculation will not be reliable in acutely ill patients  when serum creatinine is changing rapidly. It is not useful in   patients on dialysis. The eGFR calculation may not be applicable  to patients at the low and high extremes of body sizes, pregnant  women, and vegetarians.    25-Jun-15 05:11, Comprehensive Metabolic Panel  Glucose, Serum 113  BUN 19  Creatinine (comp) 1.18  Sodium, Serum 140  Potassium, Serum 4.1  Chloride, Serum 109  CO2, Serum 25  Calcium (Total), Serum 9.0  Osmolality (calc) 282  eGFR (African American) 48  eGFR (Non-African American) 42  eGFR values <639mmin/1.73 m2 may be an indication of chronic  kidney disease (CKD).  Calculated eGFR is useful in patients with stable renal function.  The eGFR calculation will not be reliable in acutely ill patients  when serum creatinine is changing rapidly. It is not useful in   patients on dialysis. The eGFR calculation may not be applicable  to patients at the low and high extremes of body sizes, pregnant  women, and vegetarians.  Anion Gap 6  Cardiac:    20-Jun-15 18:59, Troponin I  Troponin I 0.20  0.00-0.05  0.05 ng/mL or less: NEGATIVE   Repeat testing in 3-6 hrs   if clinically indicated.  >0.05 ng/mL: POTENTIAL   MYOCARDIAL INJURY. Repeat   testing in 3-6 hrs if   clinically indicated.  NOTE: An increase or decrease   of 30% or more on serial   testing suggests a   clinically important change    20-Jun-15 21:04, CPK-MB, Serum  CPK-MB, Serum < 0.5  Result(s) reported on 29 Apr 2014 at 10:51PM.    20-Jun-15 23:19, Cardiac Panel  CK, Total 34  26-192  NOTE: NEW REFERENCE RANGE   12/12/2013  CPK-MB, Serum 0.8  Result(s) reported on 30 Apr 2014 at 12:20AM.    20-Jun-15 23:19, Troponin I  Troponin I 0.15  0.00-0.05  0.05 ng/mL or less: NEGATIVE   Repeat testing in 3-6 hrs   if clinically indicated.  >0.05 ng/mL: POTENTIAL   MYOCARDIAL INJURY. Repeat   testing in 3-6 hrs if   clinically  indicated.  NOTE: An increase or decrease   of 30% or more on serial   testing suggests a   clinically important change    21-Jun-15 03:02, Cardiac Panel  CK, Total 102  26-192  NOTE: NEW REFERENCE RANGE   12/12/2013  CPK-MB, Serum 0.9  Result(s) reported on 30 Apr 2014 at 04:30AM.    21-Jun-15 03:02, Troponin I  Troponin I 0.17  0.00-0.05  0.05 ng/mL or less: NEGATIVE   Repeat testing in 3-6 hrs   if clinically indicated.  >0.05 ng/mL: POTENTIAL   MYOCARDIAL INJURY. Repeat   testing in 3-6 hrs if   clinically indicated.  NOTE: An increase or decrease   of 30% or more on serial   testing suggests a   clinically important change  Routine UA:    20-Jun-15 20:03, Urinalysis  Color (UA) Yellow  Clarity (UA) Clear  Glucose (UA) Negative  Bilirubin (UA) Negative  Ketones (UA) Negative  Specific Gravity (UA) 1.011  Blood (UA) 1+  pH (UA) 6.0  Protein (UA) Negative  Nitrite (UA)  Positive  Leukocyte Esterase (UA) Trace  Result(s) reported on 29 Apr 2014 at 09:03PM.  RBC (UA) 1 /HPF  WBC (UA) 6 /HPF  Bacteria (UA) 3+  Epithelial Cells (UA)   NONE SEEN   Result(s) reported on 29 Apr 2014 at 09:03PM.  Routine Coag:    20-Jun-15 18:59, Activated PTT  Activated PTT (APTT) 23.5  A HCT value >55% may artifactually increase the APTT. In one study,  the increase was an average of 19%.  Reference: "Effect on Routine and Special Coagulation Testing Values  of Citrate Anticoagulant Adjustment in Patients with High HCT Values."  American Journal of Clinical Pathology 2006;126:400-405.    20-Jun-15 18:59, Prothrombin Time  Prothrombin 14.7  INR 1.2  INR reference interval applies to patients on anticoagulant therapy.  A single INR therapeutic range for coumarins is not optimal for all  indications; however, the suggested range for most indications is  2.0 - 3.0.  Exceptions to the INR Reference Range may include: Prosthetic heart  valves, acute myocardial infarction, prevention  of myocardial  infarction, and combinations of aspirin and anticoagulant. The need  for a higher or lower target INR must be assessed individually.  Reference: The Pharmacology and Management of the Vitamin K   antagonists: the seventh ACCP Conference on Antithrombotic and  Thrombolytic Therapy. FXTKW.4097 Sept:126 (3suppl): [id number].  A HCT value >55% may artifactually increase the PT.  In one study,   the increase was an average of 25%.  Reference:  "Effect on Routine and Special Coagulation Testing Values  of Citrate Anticoagulant Adjustment in Patients with High HCT Values."  American Journal of Clinical Pathology 2006;126:400-405.  Routine Hem:    20-Jun-15 18:06, CBC Profile  WBC (CBC) 17.2  RBC (CBC) 4.25  Hemoglobin (CBC) 13.4  Hematocrit (CBC) 40.2  Platelet Count (CBC) 244  MCV 95  MCH 31.6  MCHC 33.4  RDW 14.0  Neutrophil % 88.9  Lymphocyte % 6.3  Monocyte % 4.3  Eosinophil % 0.0  Basophil % 0.5  Neutrophil # 15.2  Lymphocyte # 1.1  Monocyte # 0.7  Eosinophil # 0.0  Basophil # 0.1  Result(s) reported on 29 Apr 2014 at [facility].    20-Jun-15 23:16, CBC Profile  WBC (CBC) 9.4  RBC (CBC) 3.57  Hemoglobin (CBC) 11.0  Hematocrit (CBC) 34.1  Platelet Count (CBC) 177  MCV 96  MCH 30.9  MCHC 32.4  RDW 13.8  Neutrophil % 85.7  Lymphocyte % 9.6  Monocyte % 4.0  Eosinophil % 0.0  Basophil % 0.7  Neutrophil # 8.0  Lymphocyte # 0.9  Monocyte # 0.4  Eosinophil # 0.0  Basophil # 0.1  Result(s) reported on 30 Apr 2014 at 04:00AM.    24-Jun-15 04:47, CBC Profile  WBC (CBC) 13.0  RBC (CBC) 3.14  Hemoglobin (CBC) 10.1  Hematocrit (CBC) 29.9  Platelet Count (CBC) 214  MCV 95  MCH 32.3  MCHC 33.9  RDW 13.7  Neutrophil % 81.6  Lymphocyte % 10.1  Monocyte % 5.6  Eosinophil % 2.3  Basophil % 0.4  Neutrophil # 10.7  Lymphocyte # 1.3  Monocyte # 0.7  Eosinophil # 0.3  Basophil # 0.1  Result(s) reported on 03 May 2014 at 05:29AM.    25-Jun-15 05:11, CBC  Profile  WBC (CBC) 13.4  RBC (CBC) 3.09  Hemoglobin (CBC) 9.6  Hematocrit (CBC) 29.4  Platelet Count (CBC) 231  MCV 95  MCH 31.1  MCHC 32.7  RDW 13.8  Neutrophil % 82.8  Lymphocyte %  8.4  Monocyte % 7.4  Eosinophil % 1.1  Basophil % 0.3  Neutrophil # 11.1  Lymphocyte # 1.1  Monocyte # 1.0  Eosinophil # 0.2  Basophil # 0.0  Result(s) reported on 04 May 2014 at 05:41AM.   RADIOLOGY:  Radiology Results: XRay:    20-Jun-15 18:53, Chest Portable Single View  Chest Portable Single View  REASON FOR EXAM:    Sepsis  COMMENTS:       PROCEDURE: DXR - DXR PORTABLE CHEST SINGLE VIEW  - Apr 29 2014  6:53PM     CLINICAL DATA:  Sepsis    EXAM:  PORTABLE CHEST - 1 VIEW    COMPARISON:  Radiograph 11/05/2012    FINDINGS:  Sternotomy wires overlie stable enlarged cardiac silhouette. The  thoracic aorta is ectatic. Normal pulmonary vasculature. Lung bases  are poorly evaluated.     IMPRESSION:  Cardiomegaly without acute findings. Lung bases are poorly  evaluated.      Electronically Signed   By: Suzy Bouchard M.D.    On: 04/29/2014 19:18         Verified By: Rennis Golden, M.D.,    25-Jun-15 14:05, Chest Portable Single View  Chest Portable Single View  REASON FOR EXAM:    SOB/wheezing  COMMENTS:   LMP: Post-Menopausal    PROCEDURE: DXR - DXR PORTABLE CHEST SINGLE VIEW  - May 04 2014  2:05PM     CLINICAL DATA:  Short of breath, wheezing portable chest x-ray of  04/29/2014    EXAM:  PORTABLE CHEST - 1 VIEW    COMPARISON:  None.    FINDINGS:  Moderate cardiomegaly is again noted with pulmonary vascular  congestion. Small effusions cannot be excluded, most consistent with  mild CHF. Median sternotomy sutures are noted from prior CABG.     IMPRESSION:  Probable mild CHF with cardiomegaly and pulmonary vascular  congestion. Cannot exclude small effusions.      Electronically Signed    By: Ivar Drape M.D.    On: 05/04/2014 14:17         Verified By:  Joretta Bachelor, M.D.,  [place]:    21-Jun-15 08:32, US Abdomen Limited Survey  US Abdomen Limited Survey  REASON FOR EXAM:    elevated lfts  COMMENTS:   Body Site: Gallbladder, Liver, Common Bile Duct    PROCEDURE: [place]  - US ABDOMEN LIMITED SURVEY  - Apr 30 2014  8:32AM     CLINICAL DATA:  elevated lfts    EXAM:  US ABDOMEN LIMITED - RIGHT UPPER QUADRANT    COMPARISON:  None.    FINDINGS:  Gallbladder:  Small mobile gallstones within the gallbladder, largest 1.1 cm. No  gallbladder wall thickening measuring 1.7 mm. No sonographic Murphy  sign noted.    Common bile duct:    Diameter: 3.8 mm    Liver:    No focal lesion identified. Within normal limits in parenchymal  echogenicity.    Benign-appearing right renal cysts.   IMPRESSION:  No evidence of cholecystitis.  Benign appearing right renal cyst.      Electronically Signed    By: Margaree Mackintosh M.D.    On: 04/30/2014 11:22         Verified By: Mikki Santee, M.D., MD    21-Jun-15 08:32, [place] Color Flow Doppler Lower Extrem Right (Leg)  [place] Color Flow Doppler Lower Extrem Right (Leg)  REASON FOR EXAM:    Swelling  COMMENTS:  PROCEDURE: [place]  - US DOPPLER LOW EXTR RIGHT  - Apr 30 2014  8:32AM     CLINICAL DATA:  Right lower extremity swelling    EXAM:  RIGHT LOWER EXTREMITY VENOUS DOPPLER ULTRASOUND    TECHNIQUE:  Gray-scale sonography with graded compression, as well as color  Doppler and duplex ultrasound were performed to evaluate the lower  extremity deep venous systems from the level of the common femoral  vein and including the common femoral, femoral, profunda femoral,  popliteal and calf veins including the posterior tibial, peroneal  and gastrocnemius veins when visible. The superficial great  saphenous vein was also interrogated. Spectral Doppler was utilized  to evaluate flow at rest and with distal augmentation maneuvers in  the common femoral, femoral and popliteal veins.    COMPARISON:  Prior  right lower extremity duplex venous ultrasound  01/13/2012    FINDINGS:  Common Femoral Vein: No evidence of thrombus. Normal  compressibility, respiratory phasicity and response to augmentation.    Saphenofemoral Junction: No evidence of thrombus. Normal  compressibility and flow on color Doppler imaging.  Profunda Femoral Vein: No evidence of thrombus. Normal  compressibility and flow on color Doppler imaging.    Femoral Vein: No evidence of thrombus. Normal compressibility,  respiratory phasicity and response to augmentation.    Popliteal Vein: No evidence of thrombus. Normal compressibility,  respiratory phasicity and response to augmentation.    Calf Veins: No evidence of thrombus. Normal compressibility and flow  on color Doppler imaging.    Superficial Great Saphenous Vein: No evidence of thrombus. Normal  compressibility and flow on color Doppler imaging.  Venous Reflux:  None.    Other Findings:  None.     IMPRESSION:  No evidence of deep venous thrombosis.      Electronically Signed    By: Jacqulynn Cadet M.D.    On: 04/30/2014 09:33         Verified By: Criselda Peaches, M.D.,  [place]:    20-Jun-15 18:53, Chest Portable Single View  PACS Image    21-Jun-15 08:32, US Abdomen Limited Survey  PACS Image    21-Jun-15 08:32, [place] Color Flow Doppler Lower Extrem Right (Leg)  PACS Image    25-Jun-15 14:05, Chest Portable Single View  PACS Image   ASSESSMENT AND PLAN:  Assessment/Admission Diagnosis marked RLE swelling with cellulitis, improving with ABX History of PAD and BLe interventions. multiple other medical issues   Plan Her PAD is unlikely to be a direct cause of swelling, and I do not know when this was last checked.  If it has not been checked in last 6 months, would recommend arterial duplex and ABIs to assess.  These are not available in our hospital and I can arrange these as outpatient studies. As for her prominent swelling and  cellulitis, I would recommend using UNNA Boot wrap to get the swelling under control and keep the skin intact.  These would be changed weekly and we can do subsequent wraps in the office.  Would also get a venous reflux study as an outpatient as this is not available in our hospital either.  Her leg has the appearance of lymphedema, and would suspect that if the venous work up is negative, we would consider adding a lymphedema pump to her regimen as adjunctive therapy with compression in the future.  Would also keep her right leg elevated as much as possible.   level 4   Electronic Signatures: Algernon Huxley (MD)  (  Signed 25-Jun-15 17:00)  Authored: Chief Complaint and History, PAST MEDICAL/SURGICAL HISTORY, ALLERGIES, HOME MEDICATIONS, Family and Social History, Review of Systems, Physical Exam, LABS, RADIOLOGY, Assessment and Plan   Last Updated: 25-Jun-15 17:00 by Algernon Huxley (MD)

## 2015-03-03 NOTE — Consult Note (Signed)
Codeine: N/V  Acetaminophen: GI Distress   Impression 1. AMI with EKG changes in inferior leads 2. hematuria 3.  PAD s/p Lower Extremity Run-Off; Right Lower Extremity; PTA right SFA and ppliteal to 5mm with Lutonix;  PTA right peroneal to 3 mm; PTA right profunda femorus to 4 mm with Lutonix; coil embolization of  AV fistula tibial vessels  4. DM 5. JOHOVAH's witness 6. shock ...hypotesion from hematuria/blood loss 7.hx of long term AC for recurrent DVTS   Plan 1. pt is JOHOVAH's  witness and does not consent for blood products she has actuve hematurai with + CT scan likely source is right kidney 2. cards consult 3. urology consult 4. not checking hgb as per dr Belia Hemankasa 5. continue levophed for MAP>65 6. no asa, BB had issues with elevated LFTS in june with statins 7. stop xarelto   thank you will follow  [id number]   Electronic Signatures: Adrian SaranMody, [place] Duff (MD)  (Signed [phone] [id number]-Aug-15 16:35)  Authored: Allergies, Impression/Plan   Last Updated: 18-Aug-15 16:35 by Adrian SaranMody, Emmaleigh Longo (MD)

## 2015-03-03 NOTE — Consult Note (Signed)
PATIENT NAME:  [name] [name], [name] [name] MR#:  [id number] DATE OF BIRTH:  [birth year]  DATE OF CONSULTATION:  06/29/2014  REFERRING PHYSICIAN:   CONSULTING PHYSICIAN:  Knute Neu. Ingvald Theisen, MD  [name] [name] is a patient, 81 years old, who was admitted on 08/18 with hematuria, hypotension, and shock from blood loss. The patient was seen and evaluated by me on August 20, case discussed with attending, consulting physicians on that day, note entered, and orders written. This full narrative is delayed until the current time.   As also noted by the providers, this patient was admitted after an outpatient procedure for angioplasty was done and she developed hypotension and gross hematuria and some chest pain. EKG was positive for ischemia. She was transferred to critical care and placed on Levophed which maintained her blood pressure. She was confused, but mentation improved when blood pressure  improved.  She has had consultations with cardiology, pulmonary, and intensivist, also cardiology, vascular surgery.  Notably from the hematology point of view, the patient who was bleeding and dropped her hemoglobin, is a [name] and cannot take blood transfusions.   PAST MEDICAL HISTORY: Includes diabetes, coronary artery disease, history of deep vein thrombosis, previously on Xarelto, hypothyroid and hyperlipidemia, peripheral vascular disease.   PAST SURGICAL HISTORY: Cardiac bypass, cholecystectomy, thyroid surgery, and prior angioplasties.   ALLERGIES: CODEINE AND ACETAMINOPHEN.   SOCIAL HISTORY: She was a smoker, quit many years ago. No alcohol. Lives with her granddaughter.   FAMILY HISTORY: [name] to be positive for a family member with breast cancer. No other details available.   REVIEW OF SYSTEMS: On additional review of systems when I saw the patient, she was not having acute distress. Slight fatigue, but not short of breath. No chest pain. She apparently had chills on admission, but no fever, and has  had chronic visual disturbances, but no diplopia. Was not having headache or dizziness. Was not having nausea, vomiting, or diarrhea. Was not having any dysuria. Had a Foley catheter, which intermittently was darker, due to  hematuria, but it looked clear. Urine had become clear by the afternoon. I had seen the patient and then visited her again later in the day and urine had become clear in the interval. She had some history of easy bruising, but had been on Xarelto.  No rash. No pruritus. No focal weakness. No anxiety or depression.   MEDICATIONS AT HOME: Included Norvasc 5 mg daily, fish oil daily, aspirin 81 mg daily, Victoza 1.8 mg subcutaneous daily, torsemide 40 mg twice daily, simvastatin 40 mg daily, allopurinol 100 mg 2 a day, ginkgo daily, losartan daily 50 mg, potassium 10 daily, Synthroid 125 mcg daily, Xarelto 25 mg daily, Imdur 30 mg daily, Levemir 75 in the morning, 45 in the evening, hydrocodone 5 mg q. 6 hours p.r.n., gabapentin 300 mg t.i.d. sliding scale insulin, albuterol q. 6 hours p.r.n., and docusate.    PHYSICAL EXAMINATION: GENERAL: When seen, the patient was alert and cooperative.  VITAL SIGNS: Blood pressure was well maintained. HEENT: The sclerae were clear.  Mouth, no thrush. HEART:  There was some tachycardia.  LUNGS: Clear. No wheezing or rales.  ABDOMEN: Nontender. No palpable mass or organomegaly.  EXTREMITIES: No edema.  GENITOURINARY: The Foley had cleared from the morning to the afternoon.  NEUROLOGIC: Grossly nonfocal. Alert and cooperative.   LABORATORY DATA: On August 18, the hemoglobin was 7.5. By August 20, the hemoglobin had dropped to a low of 5.4. Iron saturation 11%. The ferritin was 184.  Creatinine was 1.82.  A reticulocyte count was 4.8.  Erythropoietin level which was not available at that time was checked later and it came back at 211. Renal ultrasound showed some fullness of the right renal pelvis. but no hydronephrosis.   IMPRESSION:  From the  hematology point of view, the patient has anemia due to blood loss with no history or suspicion of other underlying hematology disorder. Blood transfusion, which would otherwise be indicated. could not be given due to the patient's history of [name]. Available measures from hematology-oncology suggest would be to support with intravenous iron and with Procrit. I discussed these issues with the patient and family members. The patient reiterated her objection to blood or any plasma products. She and family had no objection to Procrit or to intravenous iron. The iron, reticulocyte count is slightly elevated, but certainly blunted for the degree of anemia, so Procrit can be helpful. The iron levels, ferritin was okay, but serum iron was low. Acute drop in bleeding would not give low iron, so that consideration should be given to the patient possibly having a prior pre-existing iron deficiency, yet back on June 20 hemoglobin had dropped to as low as 11 and then 10 grams on June 24, 8.9 on June 26, patient had infection, cellulitis can certainly cause anemia, but it looks like hydration after initial admission dropped the hemoglobin down to another baseline so it is possible that the patient back in June had a baseline anemia. This could be the azotemia, acute and chronic illness. Could also be iron deficiency pre-existing.   PLAN: The patient was given intravenous Feraheme one dose and was given subcutaneous Procrit one dose, after also discussing with cardiology, Dr. Welton FlakesKhan, discussing with him and also with the patient and family the potential risks of Procrit that can include an increased thrombotic effect. In the face of a recent MI, it was still considered a small risk compared to the possible benefit of boosting hemoglobin. Procrit could be repeated in a week if needed. Feraheme could be repeated later if needed based on serum hemoglobin and iron studies and this could be on an inpatient or outpatient  basis.    ____________________________ Knute Neuobert G. Lorre NickGittin, MD rgg:LT D: 07/30/2014 19:03:47 ET T: 07/30/2014 20:18:56 ET JOB#: [id number]  cc: Knute Neuobert G. Lorre NickGittin, MD, <Dictator> Marin RobertsOBERT G Dacoda Finlay MD ELECTRONICALLY SIGNED 08/08/2014 14:12

## 2015-03-03 NOTE — Consult Note (Signed)
PATIENT NAME:  [name] [name], [name] [name] MR#:  [name] DATE OF BIRTH:  10/19/1925  DATE OF CONSULTATION:  06/27/2014  REFERRING PHYSICIAN:  Dr. Gilda CreaseSchnier. CONSULTING PHYSICIAN:  Crosby Oriordan P. Charles Niese, MD  REASON FOR REQUEST: Hypotension, shock, and myocardial infarction.   IMPRESSION:  1.  Acute myocardial infarction with EKG changes in the inferior leads, acute.  2.  Hematuria with possible source in the right kidney.  3.  Hypotension with shock, likely from blood loss. 4.  Peripheral artery disease, status post lower extremity runoff , percutaneous transluminal angioplasty of right superficial femoral artery and right peroneal.  Coil embolization of arteriovenous fistula tibial vessel. 5.  [name]. 6.  Diabetes.  7.  History of coronary artery disease, status post coronary artery bypass grafting.  8.  History of recurrent deep venous thrombosis on Xarelto. 9.  Hypothyroidism. 10.   Hyperlipidemia.   PLAN:  1.  The patient is [name], does not consent for any blood products. She is having an active acute myocardial infarction with active hematuria.  Positive CT scan, likely source is right kidney. At this time, her blood pressure needs to be monitored and treated. No aspirin or beta blocker due to her shock and hematuria.  2.  Cardiology consultation.  3.  Urology consultation.  4.  Pulmonary consultation.  5.  Not checking hemoglobin as per Dr. Belia HemanKasa due to the fact the patient witness is [name] and would not want a blood transfusion.  6.  Continue Levophed for mean arterial pressure greater than 65.  7.  Stop Xarelto.  8.  Sliding scale insulin for her diabetes.   HISTORY OF PRESENT ILLNESS: This is an 81 year old female who had a planned an outpatient procedure for angioplasty, subsequently developed hypotension, gross hematuria, and had some chest pain. EKG was performed which was positive for ischemia in the inferior leads. She was transferred to the Critical Care  Unit on Levophed.  Her blood pressure is better. She was initially, when she was hypotensive, was mentating well, but now is mentating well.    PAST MEDICAL HISTORY:  1.  Insulin-depended diabetes.  2.  History of coronary artery disease, status post CABG.  3.  History of recurrent DVTs on chronic anticoagulation with Xarelto.  4.  Hypothyroidism.  5.  Hyperlipidemia.  6.  Peripheral vascular disease.  7.  Hypertension.   PAST SURGICAL HISTORY: 1.  Cardiac bypass. 2.  Cholecystectomy. 3.  Thyroid surgery.  4.  Angioplasty of the lower extremities.   ALLERGIES: CODEINE AND ACETAMINOPHEN.   SOCIAL HISTORY: The patient quit smoking many years ago. No alcohol or IV drug use. She lives with her granddaughter.   FAMILY HISTORY: Positive for coronary artery disease and breast cancer.   REVIEW OF SYSTEMS:  CONSTITUTIONAL: Positive for chills. No fevers, weight loss or gain.   EYES:  She has blurred vision which has been chronic.  No double vision. EARS, NOSE, AND THROAT: No tinnitus or hearing loss.  RESPIRATORY:  No cough, wheezing, hemoptysis, or COPD. CARDIOVASCULAR:  She is not having any active chest pain at this time.  She has had on-and-off chest pain as an outpatient.  No syncope or arrhythmia.  GASTROINTESTINAL: No nausea, vomiting, diarrhea, abdominal pain, melena, or ulcers. GENITOURINARY:  Positive  hematuria.  No dysuria, frequency, or urgency.  HEMATOLOGY:  Positive anemia and easy bruising.  SKIN: No rash or lesions.  MUSCULOSKELETAL: No weakness.  PSYCHIATRIC: No anxiety or bipolar affective disorder.   MEDICATIONS:  1.  Norvasc 5 mg daily. 2.  Fish oil 1000 mg daily.  3.  Tylenol 100 mg daily.  4.  Aspirin 81 mg daily.  5.  Victoza 1.87 subcutaneously daily.  6.  Torsemide 20 mg 2 tablets [name]  7.  Simvastatin 40 mg daily. 8.  Allopurinol 100 mg 2 tablets daily.  9.  Gingko 1 tablet daily.  10.  Losartan 50 mg daily.  11.  KCl 10 mEq daily.  12.   Levothyroxine 125 mcg daily. 13.  Xarelto 20 mg daily. 14.  Imdur 30 mg daily.  15.  Levemir 75 units in the morning and 45 in the evening.  16.  Acetaminophen/hydrocodone 325/5 q. 6 hours p.r.n. pain.  17.  Gabapentin 300 mg t.i.d.  18.  Insulin sliding scale.  19.  Albuterol q. 6 hours p.r.n.  20.  Docusate 100 [name]   PHYSICAL EXAMINATION:  VITAL SIGNS: Temperature is 96.2, pulse is 115, respirations 18, blood pressure is currently 126/78.  GENERAL: The patient is in no distress, is alert and oriented x 3.  HEENT: Head is atraumatic. Pupils are round. Sclerae anicteric.  Mucous membranes are moist. Oropharynx is clear.  NECK: Supple. No JVD, carotid bruit, or enlarged thyroid.  CARDIOVASCULAR: Tachycardia. No murmurs, gallops, or rubs. PMI is laterally displaced.  ABDOMEN: Bowel sounds positive. Nontender, nondistended. No hepatosplenomegaly.  LUNGS: Clear to auscultation without crackles, rales, rhonchi, or wheezing. Normal to percussion.  EXTREMITIES: No clubbing, cyanosis, or edema.  GENITOURINARY:  The patient has a Foley was positive hematuria.  SKIN: Has some bruising.  NEUROLOGIC:  Cranial nerves II-XII are intact. No focal deficits.  MUSCULOSKELETAL: 4/5 strength bilaterally and symmetrically.   LABORATORY DATA:  Sodium 146, potassium 3.3, chloride 106, bicarbonate 28, BUN 28, creatinine 1.36, and glucose 358.   Latest hemoglobin is 7.5 and Troponin was 0.02.   EKG shows inferior ischemia.   Thank you for allowing [place] to participate in the care of the patient.  Patient's primary care physician is with [facility].  We will call Dr. Ellsworth Lennox and sign the patient out.   CRITICAL TIME SPENT ON THIS CONSULT:  Approximately 50 minutes.   ____________________________ Janyth Contes. [name] Pina, MD spm:TT D: 06/27/2014 16:38:40 ET T: 06/27/2014 17:28:50 ET JOB#: [id number]  cc: [name] [name] P. [name] Pina, MD, <Dictator> Janyth Contes Gunnar Hereford MD ELECTRONICALLY SIGNED 06/27/2014 19:07

## 2015-03-03 NOTE — Op Note (Signed)
PATIENT NAME:  [name] [name], [name] [name] MR#:  [id number] DATE OF BIRTH:  [birth year]  DATE OF PROCEDURE:  06/27/2014  PREOPERATIVE DIAGNOSES: 1.  Hematuria.  2.  Hypotension following angiogram procedure.  3.  Atherosclerotic occlusive disease with ulcerations right lower extremity.   POSTOPERATIVE DIAGNOSES:  1.  Hematuria.  2.  Hypotension following angiogram procedure.  3.  Atherosclerotic occlusive disease with ulcerations right lower extremity.  PROCEDURE PERFORMED: Insertion of triple-lumen right IJ central line with ultrasound guidance.   SURGEON: Renford DillsGregory G Rashad Obeid, M.D.   INDICATIONS: [name] is an 81 year old woman who underwent successful angioplasty with revascularization of her right lower extremity for limb salvage. In the recovery area she is noted to have hematuria of significant volume and has become hypotensive down into the 80s systolic. She is therefore requiring appropriate IV access for resuscitation. The risks and benefits are reviewed. The patient and family are present. All are in agreement with proceeding.   DESCRIPTION OF PROCEDURE: The patient is in the recovery room area of special preop. She is positioned supine. She is actually in slight Trendelenburg. Right neck is prepped and draped in a sterile fashion. Ultrasound is placed in a sterile sleeve. Jugular vein is identified. It is echolucent and compressible indicating patency. Image is recorded for the permanent record. Under real-time visualization, the jugular vein is accessed with a microneedle, microwire followed by micro sheath, J-wire followed by the dilator, and a triple-lumen catheter. The catheter is then aspirated at all 3 lumens and flushed and secured to the skin of the neck with 2-0 silk. A sterile dressing with Biopatch is applied. The patient tolerated the procedure well and there were no immediate complications.    ____________________________ Renford DillsGregory G. Trixie Maclaren, MD ggs:at D: 06/28/2014 13:06:00  ET T: 06/28/2014 15:58:58 ET JOB#: [id number]  cc: Renford DillsGregory G. Reilynn Lauro, MD, <Dictator> Renford DillsGREGORY G Zanna Hawn MD ELECTRONICALLY SIGNED 07/18/2014 22:39

## 2015-03-03 NOTE — Consult Note (Signed)
PATIENT NAME:  [name], [name] MR#:  [id number] DATE OF BIRTH:  05/12/1925  DATE OF CONSULTATION:  06/28/2014  CONSULTING PHYSICIAN:  Laurier NancyShaukat A. Fiora Weill, MD  INDICATION FOR CONSULTATION: Chest pain and acute inferior wall myocardial infarction with ST elevation.  HISTORY OF PRESENT ILLNESS:  This is an 81 year old African American female with a past medical history of hypertension, hyperlipidemia, coronary artery disease, status post CABG, according to the patient 1 vessel, at a hospital in [place]; it is unclear which hospital. She presented to the hospital yesterday with right leg pain and was found to have acute occlusion of the femoral artery, for which she underwent angioplasty percutaneously with Dr. Gilda CreaseSchnier.  Angioplasty was done for the right superficial femoral artery and right peroneal artery with coil embolization of the atriovenous fistula of the tibial vessels. She was apparently taking Xarelto for DVT prior to that, and it was not clear whether she took Xarelto recently prior to the angioplasty or not. She was given heparin after the procedure and developed severe bleeding and dropped her hemoglobin to 6.2 and went into shock and hypotension. EKG was done, which showed ST elevation in the inferior leads with ST depression in lead V1 to V3 suggestive of RV infarct.  Currently, she is having chest pain for half an hour.  Her blood pressure on Levophed 10 mcg is 120/70, pulse is 100. She described the chest pain as pressure-type, 10/10 chest pain.   PAST MEDICAL HISTORY: As mentioned, a history of peripheral vascular disease, history of diabetes, [name], history of CABG in [place], hypothyroidism, hyperlipidemia. She had a history of DVT and was on Xarelto.  It was unclear when was the last dosage prior to the angioplasty yesterday.   SOCIAL HISTORY: She is an ex-smoker. No EtOH abuse.   FAMILY HISTORY: Positive for coronary artery disease.   PHYSICAL  EXAMINATION:  GENERAL: She is alert and oriented, in acute distress due to chest pain. Her blood pressure is 127/80, pulse is 100, respirations are 18. She is afebrile.  NECK: Revealed no JVD.  LUNGS: Clear.  HEART: Regular rate and rhythm. Normal S1, S2. No audible murmur.  ABDOMEN: Soft, nontender, positive bowel sounds.  EXTREMITIES: No pedal edema.  NEUROLOGIC: The patient appears to be intact.   LABORATORY AND  DIAGNOSTIC STUDIES: Her EKG shows sinus tachycardia, 103 beats per minute with ST elevation in leads II, III, aVF with accompanied ST depression in lead V1 to V3. Her BUN is 34, creatinine 1.93, glucose is 296. Her hemoglobin was checked yesterday and was 9.9, platelet count was 298,000. Her hemoglobin this morning has dropped to 6.2,  platelet count 227,000. Her troponin was 0.02 yesterday, have not been checked today.   ASSESSMENT AND PLAN: The patient has inferior wall myocardial infarction, was hypotensive yesterday. It is not clear that it was due to the bleeding versus MI. I am not sure this is a right ventricular MI.  It appears to be STEMI due to inferior wall MI, but the fact she was hypotensive, it could be a right ventricle involvement.  Advised getting an echocardiogram. Advised giving IV fluid wide open. Advised giving beta blockers, small doses, because she is tachycardic. Will not give nitrates. We will get an echocardiogram.  We will cycle cardiac enzymes.  We will get EKG.  I explained to the daughter and the patient in extensive length, over 1/2  hour to 1 hour, that the patient is having a myocardial infarction. Standard procedure is  to take the patient to the cardiac catheter lab and do angioplasty and stenting, but she would require anticoagulants, aspirin, and Plavix after or during the procedure. She already had Xarelto and heparin and caused extensive bleeding and dropped the hemoglobin to 6.  If she bleeds further from those medications, such as aspirin and Plavix and  anticoagulants, she will not make it.  Also, the other scenario is that if we do not take her to the cardiac catheter lab and she continues to have inferior wall MI, she may die from that too.  This was explained to both of her daughters and the patient, and the patient agrees that they do not want her to get any blood products because she is [name]. They do not want to take her to the catheter lab and give these measures where she gets heparin, aspirin, Plavix, and other anticoagulants postprocedure.  We will conservatively treat her, maybe make her DNR, and give IV fluids. Give beta blockers. Get an echocardiogram for repeat cardiac enzymes and EKGs.  Thank you very much for the referral.    ____________________________ Laurier Nancy, MD sak:DT D: 06/28/2014 10:29:17 ET T: 06/28/2014 11:26:56 ET JOB#: [id number]  cc: Laurier Nancy, MD, <Dictator> Laurier Nancy MD ELECTRONICALLY SIGNED 07/27/2014 13:30

## 2015-03-03 NOTE — Op Note (Signed)
PATIENT NAME:  [name], [name]#:  [id number] DATE OF BIRTH:  [birth year]  DATE OF PROCEDURE:  06/06/2014  PREOPERATIVE DIAGNOSIS: Atherosclerotic occlusive disease bilateral lower extremities with rest pain bilateral lower extremities.   POSTOPERATIVE DIAGNOSIS: Atherosclerotic occlusive disease bilateral lower extremities with rest pain bilateral lower extremities.   PROCEDURES PERFORMED: 1.  Abdominal aortogram.  2.  Right lower extremity distal runoff, 3rd order catheter placement with additional 3rd order.   SURGEON: [name] DredgeGregory Schnier, MD  SEDATION: Versed 4 mg plus fentanyl 150 mcg administered IV. Continuous ECG, pulse oximetry and cardiopulmonary monitoring is performed throughout the entire procedure by the interventional radiology nurse. Total sedation time is 2 hours 10 minutes.   ACCESS: A 6-French sheath, left common femoral artery.   FLUOROSCOPY TIME: 21.2 minutes.   CONTRAST USED: Isovue 115 mL.   INDICATIONS: [name] is an 81 year old woman who presents to the office with increasing pain in her feet bilaterally. Noninvasive studies as well as physical examination are consistent with atherosclerotic occlusive disease and rest pain. Risks and benefits for angiography and intervention are reviewed. All questions are answered. The patient agrees to proceed.   DESCRIPTION OF PROCEDURE: The patient is taken to special procedures and placed in the supine position. After adequate sedation is achieved, both groins are prepped and draped in sterile fashion. Ultrasound is placed in a sterile sleeve. Ultrasound is utilized secondary to lack of appropriate landmarks and to avoid vascular injury. Under direct visualization, common femoral artery on the left is identified. It is echolucent and pulsatile indicating patency. Image is recorded for the permanent record and under real-time visualization a micropuncture needle is inserted into the common femoral artery, microwire followed by  microsheath, J-wire followed by a 5-French sheath and 5-French pigtail catheter inserted. The pigtail catheter is positioned at the level of T12 and AP projection of the aorta is obtained.   The pigtail catheter is repositioned to above the bifurcation and a LAO projection of the aorta is obtained. Using a rim catheter, the aortic bifurcation was crossed. VS-1 would not allow for engaging the contralateral iliac and so the rim was selected. The stiff-angled glidewire was utilized. Wire was negotiated into the SFA, which is identified secondary to calcifications with plain fluoro.   A 6-French RAABE sheath is then exchanged for the 5-French sheath and positioned in the distal external and a RAO projection of the groin is obtained. Pigtail catheter is then advanced over the wire down into the SFA and distal runoff is obtained. With injections in the proximal SFA, a large AV fistula is noted as there is very rapid flow of contrast back through the femoral vein. This was not present in 2013.   As the catheter and wire are negotiated down toward the popliteal and further imaging is obtained, there is unusual anatomy identified. Subsequently, using a combination of Glidewire and straight slip catheter and ultimately a Prograde wire and catheter what appears to be an anterior tibial to posterior tibial bypass is engaged and hand injection of contrast is utilized. This demonstrates the AV fistula, which appears to be an iatrogenic AV fistula created at time of bypass. I am assuming this is a bypass. On review of the images from 2013, which were done here, there is no similar structure identified in that study. The family is unaware of any further surgery. They also however state that she had a [facility] put in at [name] is an outpatient office, in the right leg, and  later in the case stents on the left side were noted, but there are no stents on the right, and [name] an  outpatient office and is not a facility. The family denies that the patient has been in any other hospital besides [name]. Obviously, none of this is making much sense and is very difficult to ascertain the anatomy in the tibials at the trifurcation given the bypass and the AV fistula which obscures much of the detail. I can find no way to prevent flow into the fistula, again making the trifurcation anatomy very difficult to ascertain.   Subsequently, the catheter is pulled back into the distal popliteal and attempts at obtaining a near lateral projection are made. At this time, it does appear that there is a heavily diseased tibioperoneal trunk which fills what appears to be the peroneal. On injection through the RAABE in the mid SFA, the peroneal is reconstituted in its mid 3rd and flows down well to the foot filling the lateral plantar via collaterals and subsequently the pedal arch.   At this time, significant fluoro time as well as significant contrast volume had been used, 21 minutes and 115 mL, and therefore, I did not feel that further attempts at engaging this peroneal and moving forward with angioplasty were warranted and decided to treat this as a diagnostic procedure. I also feel that until I can ascertain what has been done surgically it would seem imprudent to push forward and therefore will follow up with the patient in the office, review these images and try to find out what exactly has been done since her last treatment here in 2013.   The sheath was pulled into the external on the left. Oblique view was obtained and a StarClose device deployed. There were no immediate complications.   INTERPRETATION: The aorta is diffusely diseased, but there are no hemodynamically significant stenoses. Bilateral kissing stents are noted in the common iliacs. The stents are widely patent. Distal common iliacs and external iliacs are patent bilaterally.   The right common femoral and profunda femoris is  patent and the profunda femoris is well collateralized. SFA is diffusely diseased, but patent in its midportion. There does appear to be a 60 to 75% stenosis. The popliteal artery demonstrates diffuse disease with 2 short segment occlusions. As noted above, there is very ambiguous anatomy in the trifurcation and at this point there does appear to be a tibial-tibial bypass which has been performed with a complication of an AV fistula. Ultimately, it does appear that the peroneal is diffusely diseased in its proximal one-third, but in its mid one-third as well collateralized and then is reconstituted and fills the distal two-thirds of the peroneal as well as the lateral plantar and the pedal arch.  ____________________________ Renford Dills, MD ggs:sb D: 06/06/2014 11:08:38 ET T: 06/06/2014 11:31:54 ET JOB#: [id number]  cc: Renford Dills, MD, <Dictator> Laurier Nancy, MD Renford Dills MD ELECTRONICALLY SIGNED 06/06/2014 12:26

## 2015-03-03 NOTE — Discharge Summary (Signed)
Dates of Admission and Diagnosis:  Date of Admission 29-Apr-2014   Date of Discharge 05-May-2014   Admitting Diagnosis fever, confusion   Final Diagnosis RLE cellulitis, E coli UTI    Chief Complaint/History of Present Illness Patient is an 81 y.o. AAF who presented to the ED on 04/29/14 with fever, confusion, tachycardic, RLE edema which is chronic and found to have cellulitis of RLE anteriorly.  WBCs were 17.  UA also was positive for E coli bacteria.  She has a PMH of hx of DVT (on Xarelto), DM, insulin dependent, CAD s/p CABG, hypothyroidism, hyperlipidemia, PAD, HTN, angioplasty to RLE, diastolic CHF, gout, CKD.  Patient had blood cultures drawn that remained at no growth.  Patient had ECHO on 04/30/14 with LVEF is 55-60%.  RLE US Doppler on 04/30/14 has no evidence of DVT.  CXR on 05/04/14 showed probable mild CHF/pulmonary vascular congestion.  RUQ [place] for increased LFTs showed no evidence of cholecystitis, and right renal cyst, benign appearing.  RLE cellulitis responded well to IV antibiotics.  She was placed on doxycycline PO.  Cardiology consult suggested suspected elevated troponin levels due to demand ischemia with HTN stable and continue statin on discharge.  Hx of DVT, cont Xarelto on discharge.  Pain management phone conference reported that current pain mediction regimen of gabapentin and hydrocodone/APAP prn was more than adequate for the patient.  Vascular consult suggested outpatient studies such as arterial duplex, ABIs, venous reflex study and possible lymphadema pump.  Vascular also recommended UNNA boot to RLE to contain edema and control skin integrity, to be changed weekly.  Patient will need to follow up with vascular and cardiology after discharge from rehab facility.   Allergies:  Codeine: N/V  Acetaminophen: GI Distress  Hepatic:  26-Jun-15 04:17   Bilirubin, Total  1.9  Alkaline Phosphatase  222 (45-117 NOTE: New Reference Range 09/30/13)  SGPT (ALT) 19  SGOT (AST)  17  Total Protein, Serum  5.8  Albumin, Serum  1.4  Routine Chem:  26-Jun-15 04:17   Glucose, Serum 84  BUN  19  Creatinine (comp)  1.41  Sodium, Serum 139  Potassium, Serum 3.9  Chloride, Serum  109  CO2, Serum 21  Calcium (Total), Serum 8.8  Osmolality (calc) 279  eGFR (African American)  39  eGFR (Non-African American)  34 (eGFR values <45m/min/1.73 m2 may be an indication of chronic kidney disease (CKD). Calculated eGFR is useful in patients with stable renal function. The eGFR calculation will not be reliable in acutely ill patients when serum creatinine is changing rapidly. It is not useful in  patients on dialysis. The eGFR calculation may not be applicable to patients at the low and high extremes of body sizes, pregnant women, and vegetarians.)  Anion Gap 9  Magnesium, Serum 2.0 (1.8-2.4 THERAPEUTIC RANGE: 4-7 mg/dL TOXIC: > 10 mg/dL  -----------------------)  Routine Hem:  26-Jun-15 04:17   WBC (CBC)  14.2  RBC (CBC)  2.87  Hemoglobin (CBC)  8.9  Hematocrit (CBC)  27.0  Platelet Count (CBC) 273  MCV 94  MCH 31.0  MCHC 32.9  RDW 13.9  Neutrophil % 81.4  Lymphocyte % 9.4  Monocyte % 8.0  Eosinophil % 1.0  Basophil % 0.2  Neutrophil #  11.5  Lymphocyte # 1.3  Monocyte #  1.1  Eosinophil # 0.1  Basophil # 0.0 (Result(s) reported on 05 May 2014 at 05:14AM.)   PERTINENT RADIOLOGY STUDIES: XRay:    20-Jun-15 18:53, Chest Portable Single View  Chest Portable  Single View   REASON FOR EXAM:    Sepsis  COMMENTS:       PROCEDURE: DXR - DXR PORTABLE CHEST SINGLE VIEW  - Apr 29 2014  6:53PM     CLINICAL DATA:  Sepsis    EXAM:  PORTABLE CHEST - 1 VIEW    COMPARISON:  Radiograph 11/05/2012    FINDINGS:  Sternotomy wires overlie stable enlarged cardiac silhouette. The  thoracic aorta is ectatic. Normal pulmonary vasculature. Lung bases  are poorly evaluated.     IMPRESSION:  Cardiomegaly without acute findings. Lung bases are  poorly  evaluated.      Electronically Signed   By: Suzy Bouchard M.D.    On: 04/29/2014 19:18         Verified By: Rennis Golden, M.D.,    25-Jun-15 14:05, Chest Portable Single View  Chest Portable Single View   REASON FOR EXAM:    SOB/wheezing  COMMENTS:   LMP: Post-Menopausal    PROCEDURE: DXR - DXR PORTABLE CHEST SINGLE VIEW  - May 04 2014  2:05PM     CLINICAL DATA:  Short of breath, wheezing portable chest x-ray of  04/29/2014    EXAM:  PORTABLE CHEST - 1 VIEW    COMPARISON:  None.    FINDINGS:  Moderate cardiomegaly is again noted with pulmonary vascular  congestion. Small effusions cannot be excluded, most consistent with  mild CHF. Median sternotomy sutures are noted from prior CABG.     IMPRESSION:  Probable mild CHF with cardiomegaly and pulmonary vascular  congestion. Cannot exclude small effusions.      Electronically Signed    By: Ivar Drape M.D.    On: 05/04/2014 14:17         Verified By: Joretta Bachelor, M.D.,  [place]:    21-Jun-15 08:32, US Abdomen Limited Survey  US Abdomen Limited Survey   REASON FOR EXAM:    elevated lfts  COMMENTS:   Body Site: Gallbladder, Liver, Common Bile Duct    PROCEDURE: [place]  - US ABDOMEN LIMITED SURVEY  - Apr 30 2014  8:32AM     CLINICAL DATA:  elevated lfts    EXAM:  US ABDOMEN LIMITED - RIGHT UPPER QUADRANT    COMPARISON:  None.    FINDINGS:  Gallbladder:  Small mobile gallstones within the gallbladder, largest 1.1 cm. No  gallbladder wall thickening measuring 1.7 mm. No sonographic Murphy  sign noted.    Common bile duct:    Diameter: 3.8 mm    Liver:    No focal lesion identified. Within normal limits in parenchymal  echogenicity.    Benign-appearing right renal cysts.   IMPRESSION:  No evidence of cholecystitis.  Benign appearing right renal cyst.      Electronically Signed    By: Margaree Mackintosh M.D.    On: 04/30/2014 11:22         Verified By: Mikki Santee, M.D., MD     21-Jun-15 08:32, [place] Color Flow Doppler Lower Extrem Right (Leg)  [place] Color Flow Doppler Lower Extrem Right (Leg)   REASON FOR EXAM:    Swelling  COMMENTS:       PROCEDURE: [place]  - US DOPPLER LOW EXTR RIGHT  - Apr 30 2014  8:32AM     CLINICAL DATA:  Right lower extremity swelling    EXAM:  RIGHT LOWER EXTREMITY VENOUS DOPPLER ULTRASOUND    TECHNIQUE:  Gray-scale sonography with graded compression, as well as color  Doppler and  duplex ultrasound were performed to evaluate the lower  extremity deep venous systems from the level of the common femoral  vein and including the common femoral, femoral, profunda femoral,  popliteal and calf veins including the posterior tibial, peroneal  and gastrocnemius veins when visible. The superficial great  saphenous vein was also interrogated. Spectral Doppler was utilized  to evaluate flow at rest and with distal augmentation maneuvers in  the common femoral, femoral and popliteal veins.    COMPARISON:  Prior right lower extremity duplex venous ultrasound  01/13/2012    FINDINGS:  Common Femoral Vein: No evidence of thrombus. Normal  compressibility, respiratory phasicity and response to augmentation.    Saphenofemoral Junction: No evidence of thrombus. Normal  compressibility and flow on color Doppler imaging.  Profunda Femoral Vein: No evidence of thrombus. Normal  compressibility and flow on color Doppler imaging.    Femoral Vein: No evidence of thrombus. Normal compressibility,  respiratory phasicity and response to augmentation.    Popliteal Vein: No evidence of thrombus. Normal compressibility,  respiratory phasicity and response to augmentation.    Calf Veins: No evidence of thrombus. Normal compressibility and flow  on color Doppler imaging.    Superficial Great Saphenous Vein: No evidence of thrombus. Normal  compressibility and flow on color Doppler imaging.  Venous Reflux:  None.    Other Findings:  None.     IMPRESSION:  No  evidence of deep venous thrombosis.      Electronically Signed    By: Jacqulynn Cadet M.D.    On: 04/30/2014 09:33         Verified By: Criselda Peaches, M.D.,  [place]:    20-Jun-15 18:53, Chest Portable Single View  PACS Image     21-Jun-15 08:32, US Abdomen Limited Survey  PACS Image     21-Jun-15 08:32, [place] Color Flow Doppler Lower Extrem Right (Leg)  PACS Image     25-Jun-15 14:05, Chest Portable Single View  PACS Image    Pertinent Past History:  Pertinent Past History chronic edema to RLE with angioplasty, PAD, CAD s/p CABG, DM, insulin dependent and hx of DVT, diastolic CHF, gout, CKD   Hospital Course:  Hospital Course Patient was started on IV antibiotics for both RLE cellulitis and E coli UTI, which both responded well and patient was then started on doxycycline PO.  Blood cultures had no growth.  Patient had ECHO on 04/30/14 with LVEF is 55-60%.  RLE US Doppler on 04/30/14 has no evidence of DVT.  CXR on 05/04/14 showed probable mild CHF/pulmonary vascular congestion.  RUQ [place] for increased LFTs showed no evidence of cholecystitis, and right renal cyst, benign appearing.  RLE cellulitis responded well to IV antibiotics.  She was placed on doxycycline PO.  Cardiology, Vascular and Pain Management consulted on patient's case.  Patient worked with PT as well.   Condition on Discharge Fair   Code Status:  Code Status Full Code   DISCHARGE INSTRUCTIONS HOME MEDS:  Medication Reconciliation: Patient's Home Medications at Discharge:     Medication Instructions  amlodipine 5 mg oral tablet  1 tab(s) orally once a day   fish oil 1000 mg oral capsule  1 cap(s) orally once a day   atenolol 100 mg oral tablet  1 tab(s) orally once a day   aspirin enteric coated 81 mg oral delayed release tablet  1 tab(s) orally once a day   victoza 18 mg/3 ml subcutaneous solution  1.8 milligram(s) subcutaneous once a day  torsemide 20 mg oral tablet  2 tabs (22m) orally 2 times a day    simvastatin 40 mg oral tablet  1 tab(s) orally once a day   allopurinol 100 mg oral tablet  2 tabs (2011m orally once a day with a meal as directed.   gingko biloba - oral tablet  1 tab (120 milligrams) orally once a day   losartan 50 mg oral tablet  1 tab(s) orally once a day   potassium chloride 10 meq oral tablet, extended release  1 tab(s) orally once a day   levothyroxine 125 mcg (0.125 mg) oral tablet  1 tab(s) orally once a day   xarelto 20 mg oral tablet  1 tab(s) orally once a day   isosorbide mononitrate 30 mg oral tablet, extended release  1 tab(s) orally once a day   levemir 100 units/ml subcutaneous solution  75 unit(s) subcutaneous once a day (in the morning) and 45 units subcutaneous once a day (in the evening)   acetaminophen-hydrocodone 325 mg-5 mg oral tablet  1 tab(s) orally every 6 hours, As needed, pain   gabapentin 300 mg oral capsule  1 cap(s) orally 3 times a day   insulin aspart 100 units/ml subcutaneous solution  1 unit(s) subcutaneous 3 times a day (before meals) use SS   albuterol  2.5 milligram(s)  every 6 hours, As needed, wheezing, shortness of breath   docusate sodium 100 mg oral capsule  1 cap(s) orally 2 times a day   doxycycline  100 milligram(s) orally every 12 hours x 10 days with food   ampicillin 250 mg oral capsule  1 cap(s) orally in the morning and at bedtime x 10 days    STOP TAKING THE FOLLOWING MEDICATION(S):    methylprednisolone dose pack 4 mg oral tablet: take orally as directed for 6 days. lyrica 50 mg oral capsule: 1 cap(s) orally 3 times a day  Physician's Instructions:  Home Health? No   Treatments None   Dressing Care UNNA boot to RLE, change weekly   Home Oxygen? Yes   Portable Tank? No   Oxygen delivery at home: 2L   Diet Low Sodium  Low Fat, Low Cholesterol  Carbohydrate Controlled (ADA) Diet   Dietary Supplements None   Activity Limitations As tolerated  per PT's goals in rehab/SNF   Referrals None   Return to  Work Not Applicable   Time frame for Follow Up Appointment 2-4 weeks  Vein/Vascular for outpatient procedures recommended for RLE   Time frame for Follow Up Appointment 2-4 weeks  PCP   Other Comments Dr. CrPrimus Bravoith Pain Management if patient and/or family feel that patient's chronic leg pain is not well controlled after rehab discharge.     TePernell Dupre(Attending Physician): [facility] [id number] Pain(Consultant): [facility] [address]Bu[place] [id number]  TIME SPENT:  Total Time: Greater than 30 minutes   Electronic Signatures: HoKasandra Knudsen (NP)  (Signed 26-Jun-15 14:17)  Authored: ADMISSION DATE AND DIAGNOSIS, CHIEF COMPLAINT/HPI, Allergies, PERTINENT LABS, PERTINENT RADIOLOGY STUDIES, PERTINENT PAST HISTORY, HOSPITAL COURSE, [place], PATIENT INSTRUCTIONS, Follow Up Physician, TIME SPENT   Last Updated: 26-Jun-15 14:17 by HoKasandra Knudsen (NP)

## 2015-03-03 NOTE — Consult Note (Signed)
Chief Complaint:  Subjective/Chief Complaint NO PAIN, NO SOB   VITAL SIGNS/ANCILLARY NOTES: **Vital Signs.:   20-Aug-15 15:15  Vital Signs Type Admission  Temperature Temperature (F) 97.3  Celsius 36.2  Temperature Source oral  Pulse Pulse 105  Respirations Respirations 20  Systolic BP Systolic BP 812  Diastolic BP (mmHg) Diastolic BP (mmHg) 60  Mean BP 79  Pulse Ox % Pulse Ox % 99  Pulse Ox Activity Level  At rest  Oxygen Delivery 2L   Brief Assessment:  Respiratory normal resp effort   Gastrointestinal details normal Nontender   Additional Physical Exam NEURO GROSSLY NON FOCAL   Lab Results: Routine Chem:  20-Aug-15 10:19   Glucose, Serum  270  BUN  27  Creatinine (comp)  1.82  Sodium, Serum  146  Potassium, Serum 4.3  Chloride, Serum  113  CO2, Serum 25  Calcium (Total), Serum  7.7  Anion Gap 8  Osmolality (calc) 305  eGFR (African American)  29  eGFR (Non-African American)  25 (eGFR values <55m/min/1.73 m2 may be an indication of chronic kidney disease (CKD). Calculated eGFR is useful in patients with stable renal function. The eGFR calculation will not be reliable in acutely ill patients when serum creatinine is changing rapidly. It is not useful in  patients on dialysis. The eGFR calculation may not be applicable to patients at the low and high extremes of body sizes, pregnant women, and vegetarians.)    14:29   Iron Binding Capacity (TIBC)  193  Unbound Iron Binding Capacity 171  Iron, Serum  22  Iron Saturation 11 (Result(s) reported on 29 Jun 2014 at 03:15PM.)  Ferritin ([facility] 184 (Result(s) reported on 29 Jun 2014 at 03:06PM.)  Routine Hem:  20-Aug-15 14:29   Hemoglobin (CBC)  5.4 (Result(s) reported on 29 Jun 2014 at 02:51PM.)  Retic Count  4.18  Absolute Retic Count 0.0739 (Result(s) reported on 29 Jun 2014 at 02:51PM.)   Assessment/Plan:  Assessment/Plan:  Assessment SINCE LAST EVALUATION, SPOKEN TO DR KHumphrey Rolls ANY RISK OF IRON OR PRIOCRIT  FELT TO BE SMALL VS BENEFIT OF BOOSTING HGB.   THIS AFTERNOON HGB DOWN, URINE HAS BECOME CLEAR, IRON LOW, AND RERTICS BLUNTED, LOW RESPONSE TO ANEMIA....Marland KitchenMarland Kitchen  Plan DISCUSSED WITH PATIENT, AND FAMILY, BY PHONE, ON SPEAKER PHONE. NO RELIGIOUS OBJECTION TO PROCRIT OR IV IRON. ALSO NO OBJECTION TO POTENTIAL RISK, UNDERSTOOD TO INCLUDE MI, ANAPHYLAXIS, OR MORE LIKELY POSSIBLE ABDOMINAL PAIN, NAUSEA, VOMITING,BACK PAIN, RASH, HEADACHE....WILL GIVE DOSE PROCRIT AND FShirlean Kelly  Electronic Signatures: GDallas Schimke(MD)  (Signed 20-Aug-15 17:10)  Authored: Chief Complaint, VITAL SIGNS/ANCILLARY NOTES, Brief Assessment, Lab Results, Assessment/Plan   Last Updated: 20-Aug-15 17:10 by GDallas Schimke(MD)

## 2015-03-03 NOTE — Consult Note (Signed)
PATIENT NAME:  [name] [name], [name] [name] MR#:  [id number] DATE OF BIRTH:  Jun 18, 1925  DATE OF CONSULTATION:  06/27/2014  REFERRING PHYSICIAN:   CONSULTING PHYSICIAN:  Kaila Devries Lilian Kapur, MD  REQUESTING PHYSICIAN:  Dr. Elijio Miles  REASON FOR CONSULTATION: Acute renal failure.   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 She underwent intervention for this on August 18. However, her hospital course thereafter was complicated by postoperative acute myocardial infarction, gross hematuria, and acute renal failure. We have been asked to see the patient for acute renal failure.   Upon presentation, the patient's creatinine was 1.36. She did receive IV contrast as part of the procedure. Her baseline creatinine appears to be 1.1 with an EGFR of 48. Therefore, it appears that she does have underlying chronic kidney disease, stage III. The patient has also developed anemia of blood loss and hemoglobin is down to 6.2. However, she is [name] status; therefore, cannot receive blood products. The patient has been made DNR this admission.   PAST MEDICAL HISTORY:  1. Insulin-dependent diabetes mellitus.  2. Coronary artery disease status post CABG.  3. History of recurrent DVTs on chronic anticoagulation.  4. Hypothyroidism.  5. Hyperlipidemia.  6. Peripheral vascular disease.  7. Hypertension.  8. Chronic kidney disease, stage III, baseline EGFR of 48.  9. Right lower extremity ulcerations  with a history of arteriovenous fistula of the tibial vessels status post intervention.   PAST SURGICAL HISTORY:  1. CABG.  2. Cholecystectomy.  3. Angioplasty of the lower extremities.   ALLERGIES: ACETAMINOPHEN AND CODEINE.   CURRENT INPATIENT MEDICATIONS INCLUDE:  1. Normal saline, 0.9, at 200 mL/h.  2. Synthroid 0.125 mg p.o. q. 6 a.m.  3. Metoprolol 25 mg p.o. q.6 h.  4. Morphine 2 mg IV q.2 h. p.r.n. chest pain.  5. Nitroglycerin 0.4 mg sublingual q.5 minutes p.r.n. chest pain.  6. Zofran 4 mg IV q.6 h. p.r.n. nausea or vomiting.   SOCIAL HISTORY: The patient is originally from [place], but now lives in [place]. She has history of having had 9 adult children. She quit smoking many years ago. No alcohol use. She currently lives with her granddaughter.   FAMILY HISTORY: Positive for coronary artery disease, as well as breast cancer   REVIEW OF SYSTEMS:  GENERAL: Denies weight loss or fevers. Does have some fatigue.  EYES: Denies diplopia or blurry vision.  HEENT: Denies headaches or hearing loss. Denies epistaxis.  CARDIOVASCULAR: Denies chest pain, palpitations, PND.  RESPIRATORY: Denies cough, shortness of breath, or hemoptysis.  GASTROINTESTINAL: Denies nausea, vomiting or bloody stools.  GENITOURINARY: He has been having hematuria during this admission.  MUSCULOSKELETAL: Denies joint pain, swelling or redness.  INTEGUMENTARY: Denies skin rashes or lesions.  NEUROLOGIC: Denies focal extremity numbness, weakness or tingling.  PSYCHIATRIC: Denies depression or bipolar disorder.  ENDOCRINE: Denies polyuria, polydipsia.  HEMATOLOGIC AND LYMPHATIC: Denies easy bruisability, bleeding, or swollen lymph nodes. Does have history of [name] status.  ALLERGY AND IMMUNOLOGIC: Denies seasonal allergies or history of immunodeficiency.   PHYSICAL EXAMINATION:  VITAL SIGNS: Temperature 99, pulse 102, respirations 21, blood  pressure 113/64, pulse oximetry 99% on 2  liters.  GENERAL: Reveals an elderly African American female who appears her stated age, currently in no acute distress.  HEENT: Normocephalic, atraumatic. Extraocular movements are intact. Pupils equal, round and reactive to light. No scleral icterus. Conjunctivae are pale. No epistaxis noted. Gross hearing intact. Oral mucosa moist.  NECK: Supple without JVD or lymphadenopathy.  LUNGS: Clear to auscultation bilaterally with normal respiratory effort.  HEART: S1, S2 regular rate and rhythm. No murmurs, rubs, or gallops appreciated.  ABDOMEN: Soft, nontender, nondistended. Bowel sounds positive. No rebound or guarding. No gross organomegaly appreciated.  EXTREMITIES: No clubbing, cyanosis noted. Trace bilateral lower extremity edema noted.  NEUROLOGIC: The patient is currently awake, alert, and oriented to time, person, and place. Strength is 5/5 in both upper and lower extremities.  SKIN: Warm and dry. No rashes noted.  MUSCULOSKELETAL: No joint redness, swelling or tenderness appreciated.  GENITOURINARY: No suprapubic tenderness is noted at this time. Foley catheter noted to be in place and significant blood noted in the Foley bag.  PSYCHIATRIC: The patient with appropriate affect and appears to have good insight into her current illness.   LABORATORY DATA: Sodium 146, potassium 4.3, chloride 113, CO2 of 25, BUN 27, creatinine 1.8, glucose 270.  A 2-D echocardiogram shows ejection fraction 65% to 70%. CT scan of the abdomen and pelvis without contrast was reviewed. There was a mild right hydronephrosis. There is filling defect the renal collecting system and pelvis suggesting infectious or blunting debris. There is a Foley catheter in the bladder. The bladder was distended. There was no evidence for retroperitoneal hemorrhage or groin hematoma.   IMPRESSION: This is an 82 year old, African American female, with past medical history of insulin-dependent diabetes mellitus, coronary artery disease  status post coronary artery bypass grafting, history of recurrent deep venous thromboses on chronic anticoagulation, hypothyroidism, hyperlipidemia, peripheral vascular disease, hypertension, chronic kidney disease stage III, baseline EGFR of 48, who presented to [facility] with right lower extremity ulceration and arteriovenous fistula of the tibial vessels status post intervention. Hospital course complicated by gross hematuria, acute myocardial infarction, acute blood loss anemia, acute renal failure.   PROBLEM LIST:  1. Acute renal failure.  2. Right-sided hydronephrosis.  3. Gross hematuria.  4. Chronic kidney disease, stage III.  5. Acute myocardial infarction.  6. History of right lower extremity ulceration with arteriovenous fistula involving the tibial vessels status post intervention and contrast exposure.   PLAN: The patient's acute renal failure is multifactorial secondary to contrast exposure, anemia of blood loss. We suspect that she has ATN. She does have known underlying chronic kidney disease, as well. Agree with IV fluid hydration; however, we fear that her blood will be diluted down further if normal saline continues at its current rate. Therefore, we will decrease 0.9 normal saline to 100 mL per hour. The patient was found to have right-sided hydronephrosis previously. This was likely from blood products. We will follow up with a renal ultrasound today. I agree with continuous bladder irrigation for now. Cardiology is following for the underlying myocardial infarction. Overall prognosis appears to be guarded at this point in time. Agree with palliative care consult, as well.   Further plan as the patient progresses.    ____________________________ Tama High, MD [id number] D: 06/29/2014 11:02:18 ET T: 06/29/2014 15:05:05 ET JOB#: [id number]  cc: Tama High, MD, <Dictator> Tama High MD ELECTRONICALLY SIGNED 07/27/2014 15:53

## 2015-03-03 NOTE — Consult Note (Signed)
Admit Diagnosis:   HEMORRHAGE COMPLICATING A PROCEDURE: Onset Date: 27-Jun-2014, Status: Active, Description: HEMORRHAGE COMPLICATING A PROCEDURE      Admit Reason:   Hematuria (599.70): Status: Active, Coding System: ICD9, Coded Name: Hematuria, unspecified   General Aspect 81 yo F with PAD who underwent endovascular procedure today (s/p Lower Extremity Run-Off; Right Lower Extremity; PTA right SFA and ppliteal to 71m with Lutonix;  PTA right peroneal to 3 mm; PTA right profunda femorus to 4 mm with Lutonix; coil embolization of  AV fistula tibial vessels) complicated by significant post operative hematuria/ hypotension.  Urology called to post procedure unit to evaluate new onset severe gross hematuria upon Foley catheter placement when patient unable to void following the procedure.  Upon catheter placement, several hundred mL of bright red blood noted with subsequent hypotention with systolics to low 771I(no tachycardia in setting of beta blockage).  She did respond to fluid bolus.    18 Fr 3 way catheter placed, hand irrigated, and CBI initiated.  STAT CT obtained showed blood layering in right collecting system/ renal pelvis and clots within bladder.  Hct 30 however unable to transfuse due to JClydia Llano's witness.    18Fr upsized to 24 Fr hematuria catheter with 20 ml sterile water in balloon upon return from CT scan.  Moderate amount to clot cleared from bladder and CBI restarted, titrated to pink.    Transferred to ICU for close monitoring.  On Xarelto with bolus of heparin given at time of procedure.  Of note, former smoker.  Quit at age [age]but previous 1 ppd x many years.  She does also have history of gross hematuria approxately 1 month ago which resolved spontaneously.  No flank pain or fevers.   Home Medications: Medication Instructions Status  acetaminophen-HYDROcodone 325 mg-5 mg oral tablet 1 tab(s) orally every 6 hours, As needed, pain Active  gabapentin 300 mg oral  capsule 1 cap(s) orally 3 times a day Active  Aspirin Enteric Coated 81 mg oral delayed release tablet 1 tab(s) orally once a day Active  Victoza 18 mg/3 mL subcutaneous solution 1.8 milligram(s) subcutaneous once a day Active  torsemide 20 mg oral tablet 2 tabs (490m orally 2 times a day Active  allopurinol 100 mg oral tablet 2 tabs (20042morally once a day with a meal as directed. Active  losartan 50 mg oral tablet 1 tab(s) orally once a day Active  potassium chloride 10 mEq oral tablet, extended release 1 tab(s) orally once a day Active  levothyroxine 125 mcg (0.125 mg) oral tablet 1 tab(s) orally once a day Active  Xarelto 20 mg oral tablet 1 tab(s) orally once a day Active  Levemir 100 units/mL subcutaneous solution 75 unit(s) subcutaneous once a day (in the morning) and 45 units subcutaneous once a day (in the evening) Active  amlodipine 5 mg oral tablet 1 tab(s) orally once a day Active  atenolol 100 mg oral tablet 1 tab(s) orally once a day Active    Codeine: N/V  Acetaminophen: GI Distress  Case History and Physical Exam:  Family History Non-Contributory   HEENT PERLA   Chest/Lungs No respiratory distress   Abdomen Benign   Genitalia WNL  Normal urethral meatus/ external genitalia   Neurological Grossly WNL   Skin WNL  Other  + gross hematuria   + left groin stick with dressing c/d/i   Nursing/Ancillary Notes: **Vital Signs.:   18-Aug-15 12:45  Pulse Pulse 84  Respirations Respirations 19  Systolic BP Systolic  BP 76  Diastolic BP (mmHg) Diastolic BP (mmHg) 48  Mean BP 57  Pulse Ox % Pulse Ox % 100    15:25  Pulse Pulse 122  Respirations Respirations 18  Systolic BP Systolic BP 82  Diastolic BP (mmHg) Diastolic BP (mmHg) 36  Pulse Ox % Pulse Ox % 100   Routine BB:  18-Aug-15 12:32   ABO Group + Rh Type B Positive  Antibody Screen NEGATIVE (Result(s) reported on 27 Jun 2014 at 02:05PM.)  Crossmatch Unit 1 Cancelled  Crossmatch Unit 2 Cancelled  Routine  Chem:  18-Aug-15 12:32   Result Comment XMATCH - SDS CALLED AND NOTIFIED BLOOD BANK  - PATIENT WILL NO LONGER NEED THE UNITS.  - PATIENT IS [name].  - 06-27-14 1427-PBP  Result(s) reported on 27 Jun 2014 at 02:44PM.    15:36   Glucose, Serum  358  BUN  28  Creatinine (comp)  1.36  Sodium, Serum  146  Potassium, Serum  3.3  Chloride, Serum 106  CO2, Serum 28  Anion Gap 12  Osmolality (calc) 310  eGFR (African American)  41  eGFR (Non-African American)  35 (eGFR values <43mL/min/1.73 m2 may be an indication of chronic kidney disease (CKD). Calculated eGFR is useful in patients with stable renal function. The eGFR calculation will not be reliable in acutely ill patients when serum creatinine is changing rapidly. It is not useful in  patients on dialysis. The eGFR calculation may not be applicable to patients at the low and high extremes of body sizes, pregnant women, and vegetarians.)  Cardiac:  18-Aug-15 15:36   Troponin I 0.02 (0.00-0.05 0.05 ng/mL or less: NEGATIVE  Repeat testing in 3-6 hrs  if clinically indicated. >0.05 ng/mL: POTENTIAL  MYOCARDIAL INJURY. Repeat  testing in 3-6 hrs if  clinically indicated. NOTE: An increase or decrease  of 30% or more on serial  testing suggests a  clinically important change)  Routine Coag:  18-Aug-15 12:32   Activated PTT (APTT)  46.4 (A HCT value >55% may artifactually increase the APTT. In one study, the increase was an average of 19%. Reference: "Effect on Routine and Special Coagulation Testing Values of Citrate Anticoagulant Adjustment in Patients with High HCT Values." American Journal of Clinical Pathology 2006;126:400-405.)  Routine Hem:  18-Aug-15 12:32   Hemoglobin (CBC)  9.9  Hematocrit (CBC)  30.2  WBC (CBC)  12.1  RBC (CBC)  3.12  Platelet Count (CBC) 298  MCV 97  MCH 31.8  MCHC 32.9  RDW  14.7  Neutrophil % 51.8  Lymphocyte % 40.3  Monocyte % 5.9  Eosinophil % 1.3  Basophil % 0.7   Neutrophil # 6.3  Lymphocyte #  4.9  Monocyte # 0.7  Eosinophil # 0.2  Basophil # 0.1    15:36   Hemoglobin (CBC)  7.5 (Result(s) reported on 27 Jun 2014 at 04:04PM.)  Hematocrit (CBC)  24.1 (Result(s) reported on 27 Jun 2014 at 04:04PM.)   CT:    18-Aug-15 13:30, CT Abdomen and Pelvis Without Contrast  CT Abdomen and Pelvis Without Contrast   REASON FOR EXAM:    (1) bleeding; (2) bleeding  COMMENTS:   LMP: Post-Menopausal    PROCEDURE: CT  - CT ABDOMEN AND PELVIS W0  - Jun 27 2014  1:30PM     CLINICAL DATA:  Bleeding.    EXAM:  CT ABDOMEN AND PELVIS WITHOUT CONTRAST    TECHNIQUE:  Multidetector CT imaging of the abdomen and pelvis was performed  following  the standard protocol without IV contrast.    COMPARISON:  Ultrasound 06/31/2015.  FINDINGS:  Liver normal. Spleen normal. Splenosis. Pancreas normal. No biliary  distention. Gallstones. Gallbladder is nondistended.    Adrenals normal. 6.2 cm simple cyst right kidney. 1.4 cm simple cyst  right kidney. Mild right hydronephrosis noted. Filling defects in  the renal pelvis and calices are noted. This could be related to  infectious or bloody debris. Underlying tumor cannot be excluded.  Hydroureter is present to the level of the distal ureter. No  definite obstructing stone is identified. Innumerable pelvic  calcifications are present. These are most consistent with  phleboliths. The bladder is distended. A Foley catheter is present  in the bladder. Contrast is noted in the bladder. A component of  hemorrhage within the bladder cannot be excluded.  No significant retroperitoneal adenopathy. Abdominal aorta normal  caliber. Aortoiliac atherosclerotic vascular disease. Visceral  atherosclerotic vascular disease. Bilateral iliac stents noted. No  evidence of retroperitoneal hemorrhage. No significant groin  hematoma is noted.    Appendix normal. No evidence of bowel obstruction no free air. Small  hiatal hernia. No  mesenteric mass. No significant abdominal wall  hernia.    Mild basilar atelectasis. Coronary artery disease. Prior CABG.  Degenerative changes thoracolumbar spine and both hips.     IMPRESSION:  1. Mild right hydronephrosis. Filling defect in the renal collecting  system and pelvis suggesting infectious or blunting debris.  Underlying tumor cannot be excluded. Mild hydroureter is present to  the level of the distal ureter. No definite obstructing stones  identified.  2. Foley catheter in the bladder. The bladder is distended. Active  bleeding within the bladder may be present.  3. No evidence of retroperitoneal hemorrhage or groin hematoma.    These results were called by telephone at the time of interpretation  on 06/27/2014 at 1:51 pm to Dr. Hortencia Pilar , who verbally  acknowledged these results.      Electronically Signed    By: Marcello Moores  Register    On: 06/27/2014 13:50         Verified By: Osa Craver, M.D., MD    Impression 81 yo F with signficant acute gross hematuria in setting of anticoagulation following endovascular procedure complicated by shock from acute blood loss anemia.  Currently hemodynamically stable with dramatic improvement in hematuria s/p initiation of CBI (currently light pink on slow gtt).  Source of bleeding remains somewhat unclear (uppertract vs. bladder) however given profound presentation, suspect renal in origin.  Given history of gross hematuria, will need complete GU work up including CT Urogram (if able given renal function, ideally prior to discharge) and outpatient office cystoscopy to further evaluate bladder.   Plan 1. Maintain CBI overnight, titrate gtt to light pink 2. If urine remains relatively clear, stop drip in AM and void trial later in afternoon 3. Ideally, transfuse as needed, however refuses blood (Jahovah's witness), supportive care 4. Consider return to IR with angioembolization if acute severe bleeding/ hemodynamic  instability recurs 5. CT Urogram prior to discharge if renal function improves 6. Outpatient cystoscopy within 4 weeks  Plan discussed with patient and family  Hollice Espy, MD [place]  Please call our office with questions or concerns   Electronic Signatures: Sherlynn Stalls (MD)  (Signed 18-Aug-15 18:47)  Authored: Health Issues, General Aspect/Present Illness, Home Medications, Allergies, History and Physical Exam, Vital Signs, Labs, Radiology, Impression/Plan   Last Updated: 18-Aug-15 18:47 by Sherlynn Stalls (MD)

## 2015-03-03 NOTE — Consult Note (Signed)
Brief Consult Note: Diagnosis: blood loss anemia.Marland Kitchen. Hx clots prior xaralto now d/c.   Patient was seen by consultant.   Comments: discussed with DR Tim LairKASSA.Marland Kitchen.Marland Kitchen.Nothing to offer except posssible procrot and/or iv iron.   would check iron sat and ferratin, if low would give iv iron. Epo can boost retic response, but epo risks include increase in clot, stroke, MI, usually want to avoid with recent thrombotic event.  Will check retics, and if retics low can give dose procrit if ok with cardiology..will still take 2 to 3 days to stimulate response and a week for significant rise of 1 gram hgb.  Choice of plasma expanders as per medicine.  Electronic Signatures: Marin RobertsGittin, Aqib Lough G (MD)  (Signed 20-Aug-15 13:22)  Authored: Brief Consult Note   Last Updated: 20-Aug-15 13:22 by Marin RobertsGittin, Kalid Ghan G (MD)

## 2015-03-03 NOTE — Consult Note (Signed)
General Aspect 81 yo AAF with history of DM, HTN, HLD, CAD s/p CABG, PAD, chronic diastolic CHF, DVT admitted with fever, confusion, likely sepsis. She has chronic right leg swelling, DVT. She is being treated for presumed sepsis by the primary team. She has a history of CAD with CABG in early 2000s and also coronary stenting. She has had several episodes of chest pain over the last few weeks. she was seen in our [facility] cardiology office 04/28/14 and a stress myoview was planned. Now admitted with fever and confusion, possible sepsis. Troponin minimally elevated with flat trend (peak 0.20). She has had no chest pain over the last 48 hours. Also h/o DVT, on Xarelto chronically.   PMH: DM, HTN, HLD, hypothyroidism, PAD, CAD s/p CABG, chronic diastolic CHF, DVT  PSH: CABG, thyroidecomy  All: Tylenol, Codeine  SH: Remote tobacco, none currently. No alcohol, illicit drug use.   FH: CAD  Meds: See computer list   Physical Exam:  GEN well developed, well nourished, no acute distress   HEENT moist oral mucosa   NECK supple   RESP normal resp effort  clear BS   CARD Regular rate and rhythm   ABD denies tenderness  normal BS   LYMPH negative neck   EXTR positive edema, 3+ right lower ext edema, no left lower ext edema   SKIN normal to palpation   NEURO cranial nerves intact   PSYCH A+O to time, place, person   Review of Systems:  Subjective/Chief Complaint Right leg pain, No chest pain   Skin: No Complaints   ENT: No Complaints   Eyes: No Complaints   Neck: No Complaints   Respiratory: No Complaints   Cardiovascular: No Complaints   Gastrointestinal: No Complaints   Genitourinary: No Complaints   Musculoskeletal: right leg pain   Neurologic: No Complaints   Review of Systems: All other systems were reviewed and found to be negative   Medications/Allergies Reviewed Medications/Allergies reviewed   Lab Results: LabObservation:  21-Jun-15 08:32    OBSERVATION Reason for Test Swelling    08:58   OBSERVATION Reason for Test  Routine Chem:  21-Jun-15 03:02   Glucose, Serum 73  BUN  21  Creatinine (comp) 1.20  Sodium, Serum 144  Potassium, Serum 3.9  Chloride, Serum  110  CO2, Serum 23  Calcium (Total), Serum  7.8  Anion Gap 11  Osmolality (calc) 288  eGFR (African American)  47  eGFR (Non-African American)  41 (eGFR values <38m/min/1.73 m2 may be an indication of chronic kidney disease (CKD). Calculated eGFR is useful in patients with stable renal function. The eGFR calculation will not be reliable in acutely ill patients when serum creatinine is changing rapidly. It is not useful in  patients on dialysis. The eGFR calculation may not be applicable to patients at the low and high extremes of body sizes, pregnant women, and vegetarians.)  Result Comment POTASSIUM - Slight hemolysis, interpret results with  - caution.  Result(s) reported on 30 Apr 2014 at 04:06AM.  Result Comment TROPONIN - RESULTS VERIFIED BY REPEAT TESTING.  - PREVIOUS CALL:04/29/14 AT 2008..[facility] - Slight hemolysis, interpret results with  - caution.  Result(s) reported on 30 Apr 2014 at 04:30AM.  Cardiac:  21-Jun-15 03:02   Troponin I  0.17 (0.00-0.05 0.05 ng/mL or less: NEGATIVE  Repeat testing in 3-6 hrs  if clinically indicated. >0.05 ng/mL: POTENTIAL  MYOCARDIAL INJURY. Repeat  testing in 3-6 hrs if  clinically indicated. NOTE: An increase  or decrease  of 30% or more on serial  testing suggests a  clinically important change)  CK, Total 102 (26-192 NOTE: NEW REFERENCE RANGE  12/12/2013)  CPK-MB, Serum 0.9 (Result(s) reported on 30 Apr 2014 at 04:30AM.)   EKG:  EKG Interp. by me  NSR   Interpretation poor R wave progression precordial leads, no injury current    Codeine: N/V  Acetaminophen: GI Distress  Vital Signs/Nurse's Notes: **Vital Signs.:   21-Jun-15 05:10  Vital Signs Type Routine  Temperature Temperature  (F) 100.6  Celsius 38.1  Temperature Source oral  Pulse Pulse 107  Respirations Respirations 22  Systolic BP Systolic BP 99  Diastolic BP (mmHg) Diastolic BP (mmHg) 62  Mean BP 74  Pulse Ox % Pulse Ox % 98  Pulse Ox Activity Level  At rest  Oxygen Delivery 2L  *Intake and Output.:   21-Jun-15 00:48  Grand Totals Intake:   Output:  100    Net:  -100 24 Hr.:  -400    01:25  Grand Totals Intake:   Output:  0    Net:  0 24 Hr.:  -400    05:04  Grand Totals Intake:   Output:  200    Net:  -200 24 Hr.:  -600    05:11  Grand Totals Intake:   Output:  0    Net:  0 24 Hr.:  -600    05:39  Grand Totals Intake:  600 Output:      Net:  768 11 Hr.:  0    06:29  Grand Totals Intake:   Output:  0    Net:  0 24 Hr.:  0    Shift 07:00  Grand Totals Intake:  600 Output:  300    Net:  300 24 Hr.:  0    Daily 07:00  Grand Totals Intake:  600 Output:  600    Net:  0 24 Hr.:  0    08:52  Grand Totals Intake:   Output:  0    Net:  0 24 Hr.:  0    10:49  Grand Totals Intake:   Output:  0    Net:  0 24 Hr.:  0    Shift 15:00  Grand Totals Intake:   Output:  0    Net:  0 24 Hr.:  0    Impression 1. NSTEMI: She has known CAD. Troponin is mildly elevated (peak 0.20) with flat trend. Likely elevated due to demand ischemia in setting of sepsis. Would continue home meds. She will need an ischemic evaluation as planned after sepsis resolves. This may be an outpatient myoview. She is fully anti-coagulated at this time with Xarelto. We will follow with you.   Electronic Signatures: Burnell Blanks (MD)  (Signed 21-Jun-15 12:15)  Authored: General Aspect/Present Illness, History and Physical Exam, Review of System, Labs, EKG , Allergies, Vital Signs/Nurse's Notes, Impression/Plan   Last Updated: 21-Jun-15 12:15 by Burnell Blanks (MD)

## 2015-03-03 NOTE — Consult Note (Signed)
Chief Complaint:  Subjective/Chief Complaint CC: Hematuria  [name] [name] is a pleasant 81 YO AA woman with recent new onset gross hematuria. Pls refer to prior notes for complete hx. I was called earlier today, prior to the start of my call, that patient's hematuria 3-way catheter was not draining and that a bedside bladder scan revealed > 1000mL. I advised nurses over the phone to irrigate with a catheter tipped syringe. Reportedly, ~800cc of urine drained afterwards. She again experienced catheter trouble. I was able to irrigate several small clots and discontinued CBI as her urine was entirely clear.   VITAL SIGNS/ANCILLARY NOTES: **Vital Signs.:   20-Aug-15 15:15  Vital Signs Type Admission  Temperature Temperature (F) 97.3  Celsius 36.2  Temperature Source oral  Pulse Pulse 105  Respirations Respirations 20  Systolic BP Systolic BP 119  Diastolic BP (mmHg) Diastolic BP (mmHg) 60  Mean BP 79  Pulse Ox % Pulse Ox % 99  Pulse Ox Activity Level  At rest  Oxygen Delivery 2L   Brief Assessment:  GEN well developed, well nourished   Respiratory normal resp effort   Gastrointestinal Normal   Gastrointestinal details normal Soft  Nontender  Nondistended   EXTR negative cyanosis/clubbing   Additional Physical Exam GU: hematuria, 3-way catheter irrigated successfully. Clear urine. Several clots irrigated followed by 300cc of urine afterwards.   Assessment/Plan:  Assessment/Plan:  Assessment Pleasant 81 YO AA woman with MMP, recent STEMI, Jehova's Witness, with new onset gross hematuria managed with CBI. Urine clear now. Irrigated small clots. Bladder empty. Flow excellent.   Plan Will discontinue CBI tonight. May resume if urine appears bloody or passage of clots noted. If urine remains clear, patient may be a candidate to have a trial of void while in the hospital. [name] [name] will need to be scheduled for outpatient hematuria evaluation with a CT urogram and a cystoscopy under the  attendance of Dr[facility]Brandon [facility].   Thank you for allowing me to participate in [name] [name]'s care.   Electronic Signatures: Angelina PihKurpad, Kamrin Spath R (MD)  (Signed 20-Aug-15 17:44)  Authored: Chief Complaint, VITAL SIGNS/ANCILLARY NOTES, Brief Assessment, Assessment/Plan   Last Updated: 20-Aug-15 17:44 by Angelina PihKurpad, Elijio Staples R (MD)

## 2015-03-03 NOTE — Consult Note (Signed)
Chief Complaint:  Subjective/Chief Complaint Chest pain less frequent. BP stable.   VITAL SIGNS/ANCILLARY NOTES: **Vital Signs.:   20-Aug-15 06:00  Pulse Pulse 102  Respirations Respirations 21  Systolic BP Systolic BP 113  Diastolic BP (mmHg) Diastolic BP (mmHg) 64  Mean BP 80  Pulse Ox % Pulse Ox % 99  Pulse Ox Activity Level  At rest  Oxygen Delivery 2L   Brief Assessment:  GEN no acute distress, better perfused today   Cardiac Regular  no murmur   Respiratory normal resp effort  clear BS   Gastrointestinal Normal   Gastrointestinal details normal Nontender   EXTR negative edema, warm well perfused   Assessment/Plan:  Assessment/Plan:  Assessment PAD-sp pci Acute blood loss anemia-Jehovah'Bayli Quesinberry witness precludeds prbc transfusion Acute STEMI-ct conservative tx per cardiology, awaits echo Hypotension-ct levophed, titrate to MAP of 65, consider hetastarch for plasma expansion H/o DVT-xarelto held due to bleeding ARF-consult renal Ethics-poor prognosis with high risk of cardiac arrest, now DNR   Electronic Signatures: Charlesetta Garibaldiejan Sie, Sheikh A (MD)  (Signed 20-Aug-15 09:26)  Authored: Chief Complaint, VITAL SIGNS/ANCILLARY NOTES, Brief Assessment, Assessment/Plan   Last Updated: 20-Aug-15 09:26 by Charlesetta Garibaldiejan Sie, Sheikh A (MD)

## 2015-03-03 NOTE — Consult Note (Signed)
Patient was made DNR.  Electronic Signatures: Radene KneeKhan, Lindsey Hommel Ali (MD)  (Signed on 19-Aug-15 10:34)  Authored  Last Updated: 19-Aug-15 10:34 by Radene KneeKhan, Carnie Bruemmer Ali (MD)

## 2015-03-04 NOTE — Discharge Summary (Signed)
PATIENT NAME:  [name] [name], [name] [name] MR#:  [id number] DATE OF BIRTH:  1925/09/07  DATE OF ADMISSION:  11/30/2011 DATE OF DISCHARGE:  12/01/2011  DISCHARGE DIAGNOSES:  1. Bradycardia/junctional rhythm, now rates much better off atenolol and Cardizem.  2. Elevated liver function tests, could be due to statins, now stopped, numbers improving.  3. Mild acute renal failure, improving. Holding Lasix and hydrochlorothiazide.   SECONDARY DIAGNOSES:  1. Diabetes.  2. Coronary artery disease, status post coronary artery bypass graft.  3. Hypothyroidism.  4. Hyperlipidemia.  5. Peripheral vascular disease.  6. Hypertension.   CONSULTANT: Julien Nordmannimothy Gollan, MD  PROCEDURES/RADIOLOGY: 2-D echocardiogram on 12/01/2011 showed normal LV size. Normal LV systolic function. Ejection fraction more than 55%. Impaired LV relaxation. Mild concentric left ventricular hypertrophy. Elevated RV systolic pressure at 50 to 60 mmHg. Moderate pulmonary hypertension.   Chest x-ray on 11/30/2011 showed small effusion versus scarring at the left costophrenic angle. Possible pulmonary edema.   HISTORY AND SHORT HOSPITAL COURSE: The patient is an 81 year old female with the above-mentioned medical problems who was admitted for bradycardia and shoulder pain. Her heart rate was found to be in the 40s with junctional rhythm. She was also hypotensive while in the Emergency Department with systolic pressure in the 80s. Please see Dr. Carroll SageShayiq Ahmadzia's dictated history and physical for further details. Cardiology evaluation was performed with Dr. Jens Somrenshaw followed by Dr. Mariah MillingGollan who recommended stopping her atenolol and Cardizem which was done. Her heart rate was much better, in the 70s and 80s off those medications. She was also found to have elevated liver function tests which was thought to be due to statin along with elevated creatinine which was thought to be secondary to Lasix and both of those medications were held. Liver function tests  and renal function were slowly improving. Her heart rate and blood pressure remained stable and she was discharged home on 12/01/2011 in stable condition.   DISCHARGE PHYSICAL EXAMINATION:   VITAL SIGNS: On the date of discharge her temperature was 98.5, heart rate 85 per minute, respirations 18 per minute, blood pressure 128/66 mmHg, and she was saturating 95% on room air.   CARDIOVASCULAR: S1 and S2 normal. No murmurs, rubs, or gallops.   LUNGS: Clear to auscultation bilaterally. No wheezing, rales, rhonchi, or crepitation.   ABDOMEN: Soft, benign.   NEUROLOGIC: Nonfocal examination. All other physical examination remained at baseline.   (This dictation will be stopped here, I will dictate remaining discharge summary later.) ____________________________ Beanca Kiester S. Sherryll BurgerShah, MD vss:slb D: 12/02/2011 14:19:42 ET T: 12/03/2011 11:27:37 ET JOB#: [id number]  cc: Fountain Derusha S. Sherryll BurgerShah, MD, <Dictator> Ellamae SiaVIPUL S [facility] MD ELECTRONICALLY SIGNED 12/03/2011 23:14

## 2015-03-04 NOTE — Op Note (Signed)
PATIENT NAME:  [name] [name], [name]#:  [id number] DATE OF BIRTH:  June 27, 1925  DATE OF PROCEDURE:  01/27/2012  PREOPERATIVE DIAGNOSIS: Atherosclerotic occlusive disease of bilateral lower extremities with ulceration of the right foot.   POSTOPERATIVE DIAGNOSIS: Atherosclerotic occlusive disease of bilateral lower extremities with ulceration of the right foot.   PROCEDURES PERFORMED:  1. Abdominal aortogram.  2. Right lower extremity distal runoff, third order catheter placement.  3. Percutaneous transluminal angioplasty of right superficial femoral artery to 6 mm.  4. Percutaneous transluminal angioplasty of right peroneal artery to 3 mm.   SURGEON: Renford DillsGregory G. Schnier, M.D.   SEDATION: Versed 4 mg plus fentanyl 150 mcg administered IV. Continuous ECG, pulse oximetry and cardiopulmonary monitoring is performed throughout the entire procedure by the interventional radiology nurse. Total sedation time was one hour.   ACCESS: 6 French sheath left common femoral artery.   CONTRAST USED: Isovue 80 mL.   FLUOROSCOPY TIME: 16.7 minutes.   INDICATIONS: [name] is an 81 year old woman who presented to the office with ulceration of her right foot. She has known atherosclerotic occlusive disease. Physical examination as well as noninvasive studies demonstrated significant arterial occlusive disease, on the right, and she is therefore undergoing angiography with the hope for intervention to allow for limb salvage. The risks and benefits were reviewed, all questions are answered, and the patient agrees to proceed.   DESCRIPTION OF PROCEDURE: The patient is taken to the special procedures suite and placed in the supine position. After adequate sedation is achieved, both groins are prepped and draped in sterile fashion. Ultrasound is placed in a sterile sleeve. Ultrasound is utilized secondary to lack of appropriate landmarks and to avoid vascular injury. Under direct ultrasound visualization, the common  femoral artery is identified. It is echolucent and pulsatile indicating patency. Image is recorded for the permanent record. With real-time visualization, the microneedle is inserted into the anterior wall of the common femoral artery, microwire followed by microsheath, J-wire followed by 5 French sheath and 5 French pigtail catheter. The pigtail catheter is advanced up to the level of T12 and bolus injection of contrast is utilized to demonstrate the abdominal aorta, in the AP projection. The pigtail catheter is repositioned to above the bifurcation and LAO projection is obtained, with a bolus injection of contrast.   Using an Advantage wire and a VS-1 catheter, the bifurcation is crossed and the wire and catheter are advanced down into the SFA. Once the wire has adequate purchase, catheter and 5 French sheath are removed and a 6 [place] Ansel sheath is advanced up and over. The patient is then initially given 4000 units of heparin. She is given an additional 2000 units of heparin later in the case.   With the Advantage wire and a straight catheter, the SFA is negotiated. Subtotal occlusion at Hunter's canal is identified. There is also a high-grade stenosis in the tibioperoneal trunk extending into the peroneal. Using a straight slip catheter, distal runoff is obtained from the level of the distal popliteal representing third order catheter placement. The patient has single vessel runoff.   Wire and catheter are then negotiated into the peroneal and a 3 x 10 balloon is used to angioplasty the proximal peroneal, tibioperoneal trunk, and the distal popliteal. Follow-up imaging demonstrates adequate result with less than 15% residual stenosis. A 6 x 15 balloon is then advanced over the wire and, beginning in the above-knee popliteal, three serial angioplasties are performed. Follow-up angiography demonstrates a widely patent superficial femoral artery without  evidence of dissection.   The sheath is then pulled  into the left external iliac,  the balloon is removed, oblique view of the left groin is obtained, and a StarClose device is deployed successfully without difficulty. There are no immediate complications.   INTERPRETATION: The abdominal aorta is opacified with a bolus injection of contrast. There is diffuse atherosclerotic disease, but no hemodynamically significant lesions noted. Bilateral stents are noted at the aortic bifurcation elevating the aortic iliac bifurcation by approximately 1 cm. They appear to be self-expanding stents. Common iliacs, within the stented area, are widely patent. The external iliacs are also widely patent bilaterally. Common femoral is widely patent. The profunda femoris demonstrates a severe lesion in the trunk of the peroneal after the first major bifurcation. There are extensive collaterals around the geniculates from the other trunk of the profunda. The superficial femoral artery is widely patent in its proximal half. Distally there is diffuse disease with a subtotal occlusion at Hunter's canal. As described above, the popliteal is patent. Distally the popliteal, tibioperoneal trunk, and proximal peroneal demonstrate several focal stenoses with diffuse disease. There is single vessel runoff via the peroneal to the foot.   Following angioplasty of the tibial vessel to 3 mm, there is resolution with significant improvement and following angioplasty of the SFA to 6 mm there is resolution without evidence of dissection, therefore no stenting is required.   SUMMARY: Successful recanalization of right lower extremity for limb salvage as described above. ____________________________ Renford Dills, MD ggs:slb D: 01/27/2012 14:10:50 ET T: 01/27/2012 14:33:02 ET JOB#: [id number]  cc: Renford Dills, MD, <Dictator> Burnett Harry. Stacie Acres, [place] Renford Dills MD ELECTRONICALLY SIGNED 01/30/2012 17:44

## 2015-03-04 NOTE — H&P (Signed)
PATIENT NAME:  [name] [name], [name] [name] MR#:  [id number] DATE OF BIRTH:  06-Dec-1924  DATE OF ADMISSION:  11/30/2011  REFERRING PHYSICIAN: Dr. Mayford Knife    PRIMARY CARE PHYSICIAN: Dr. Stacie Acres    CARDIOLOGIST: Dr. Mariah Milling    CHIEF COMPLAINT: Dizziness.   HISTORY OF PRESENT ILLNESS: The patient is an 81 year old African American female with history of diabetes, hypertension, PVD, coronary artery disease status post CABG who presents with dizziness, shortness of breath, and lightheadedness starting at 9 a.m. The patient also had some nausea. The patient had no chest pains, however. The family brought the patient to the ER and here was found with heart rates in the 40's and a junctional rhythm. She was given atropine 0.5 mg x2 as the patient's blood pressure was on the lower side, 90's over 40's. The heart rate and blood pressure have improved. The hospitalist service was contacted for further evaluation and management. She denies having increase in the dosages of her beta-blocker and calcium channel blocker. She denies having any chest pains and dizziness currently. She denies any orthopnea. She has chronic lower extremity edema. She complains of constipation.   PAST MEDICAL HISTORY:  1. Diabetes.  2. Coronary artery disease status post coronary artery bypass graft in 1999. 3. Hypothyroidism. 4. Hyperlipidemia. 5. Peripheral vascular disease. 6. Hypertension.  7. Angioplasty of the right leg.   ALLERGIES: Codeine and acetaminophen.   MEDICATIONS:  1. Atenolol 50 mg daily.  2. Cartia XT 120 mg daily.  3. Colchicine 0.6 mg daily.  4. Zetia 10 mg daily.  5. Cozaar 100 mg daily.  6. Aspirin 325 mg daily.  7. Fish Oil daily. 8. Gingko Biloba daily.  9. Humulin 70/30 7 units in the morning and 30 units in p.m.  10. Lovastatin 40 mg daily.  11. Melatonin 3 mg daily.  12. Metformin 500 mg daily.  13. Potassium chloride 10 mEq daily.  14. Synthroid 125 mcg daily.  15. Lasix 40 mg daily.   SOCIAL  HISTORY: No tobacco, alcohol, or drug use. She lives with her grandson and granddaughter.   FAMILY HISTORY: Brother with MI. Mom with pneumonia, passed away at age 68.   REVIEW OF SYSTEMS: CONSTITUTIONAL: No fever or weight changes. EYES: Had blurry vision in the morning, not now. ENT: No tinnitus, hearing loss, or difficulty swallowing. RESPIRATORY: Had some shortness of breath earlier but not now. No cough, wheezing, or chronic obstructive pulmonary disease. CARDIOVASCULAR: Denies chest pain or orthopnea. She has chronic lower extremity edema. No history of arrhythmia in the past per patient. No palpitations. GI: Had some nausea but not now. Has constipation. No rectal bleeding. GU: No dysuria or hematuria. ENDOCRINE: No polyuria or nocturia. HEME/LYMPH: No anemia or easy bruising. SKIN: No rashes. MUSCULOSKELETAL: There is arthritis. NEUROLOGIC: Had some numbness in the left upper extremity earlier today, currently resolved. No ataxia or dementia. PSYCH: No insomnia or depression.   PHYSICAL EXAMINATION:   VITAL SIGNS: On arrival, pulse 53, respiratory rate 20, blood pressure 98/49, oxygen sat 100%.   GENERAL: The patient is an [place] American female laying in bed in no obvious distress.   HEENT: Normocephalic, atraumatic. Pupils are equal and reactive. Moist mucous membranes. Anicteric sclerae.   NECK: No thyroid tenderness. No lymphadenopathy. Neck is supple.   CARDIOVASCULAR: S1, S2 bradycardic. No murmurs, rubs, or gallops.   LUNGS: Clear to auscultation. No wheezing.   ABDOMEN: Soft, nontender, nondistended. Positive bowel sounds. No organomegaly appreciated.   EXTREMITIES: 2+ lower extremity edema to below  the knees. Chronic venostasis changes.   NEUROLOGIC: Cranial nerves III to XII grossly intact. Strength 5 out of 5 in all extremities.   PSYCH: Awake, alert, oriented x3. Mood is appropriate.   LABORATORY, DIAGNOSTIC, AND RADIOLOGICAL DATA: Glucose 154, BUN 21, creatinine  1.57, sodium 147, potassium 4.2, chloride 110, albumin 2.9, alkaline phosphatase 137, AST 156, ALT 144. Troponin negative x1. CK total 281. WBC 11.1, hemoglobin 12.6, hematocrit 37.5.   Initial EKG junctional rhythm. No acute ST elevations. Rate is 46. No ST depressions either. Second EKG sinus bradycardia, rate 51. X-ray of the chest scarring versus small effusion in the left costophrenic angle region; interstitial prominent edema likely representing pulmonary edema.   ASSESSMENT AND PLAN: We have an 81 year old African American female with coronary artery disease status post CABG, hypertension, diabetes, hyperlipidemia, and hypothyroidism with junctional bradycardia on arrival status post atropine, currently better.  1. For junctional bradycardia, it appears has converted to sinus currently. Rate was in the mid 50's when I was in the room. She is not symptomatic currently. The patient was seen by Cardiology and, per [facility], we would hold atenolol and Cartia for now. Would also hold Cozaar as blood pressure was on the lower side. We would cycle the cardiac markers and also get an echocardiogram. Would monitor the heart rate and symptoms closely and would keep atropine at bedside.  2. The patient also had relative hypotension and again we would hold the Cozaar and we would resume it in the morning. Would also hold the Lasix for today and it could be resumed tomorrow if the blood pressure is better.  3. The patient has mild elevated liver function tests. Do not have a clear reason for this, however, the patient was relatively hypotensive. There could be also medication involvement. We would recheck the LFTs in the morning and hold the statin for now. We would check a TSH and resume the Synthroid. Would also get another EKG in the morning for follow-up. The patient also has elevated BUN and creatinine, however, it appears that is more of a chronic issue.  4. The patient has slight  elevation of the WBC. Would check a urinalysis. She has no fever and did not complain of any URI infection or GI symptoms besides the constipation. This possibly is reactive. We would monitor WBCs for now and would not resume antibiotics for now.  5. We would resume the patient's outpatient insulin regimen and check a hemoglobin A1c as well. However, we would hold the metformin for now given the chronic kidney disease.   CODE STATUS: FULL CODE.   TOTAL TIME SPENT: 55 minutes.   ____________________________ Krystal EatonShayiq Arisbel Maione, MD sa:drc D: 11/30/2011 16:52:21 ET T: 12/01/2011 06:16:10 ET JOB#: [id number]  cc: Krystal EatonShayiq Shazia Mitchener, MD, <Dictator> Krystal EatonSHAYIQ Marisabel Macpherson MD ELECTRONICALLY SIGNED 12/09/2011 18:36

## 2015-03-04 NOTE — Consult Note (Signed)
PATIENT NAME:  [name] [name], [name] [name] MR#:  [id number] DATE OF BIRTH:  [birth year]  DATE OF CONSULTATION:  11/30/2011  REFERRING PHYSICIAN:   CONSULTING PHYSICIAN:  Madolyn FriezeBrian S. Wells Gerdeman, MD  HISTORY OF PRESENT ILLNESS: [name] [name] is a pleasant 81 year old female with past medical history of coronary artery disease status post coronary artery bypass graft, hypertension, diabetes, hyperlipidemia, and peripheral vascular disease who I am asked to evaluate for hypotension and bradycardia. The patient typically does have some dyspnea on exertion as well as chronic pedal edema but there is no orthopnea or PND. She denies any recent chest pain, palpitations, or syncope. This morning she developed increased dyspnea. She had some shoulder pain that increased with arm movements but there was no chest pain. She also complained of dizziness and weakness. She presented to the Emergency Room and was found to be in a junctional rhythm with a heart rate of 46. Her systolic blood pressure was 80. She was given atropine with some improvement. Cardiology is now asked to further evaluate. Note at this point she is not having chest pain or shortness of breath.   PAST MEDICAL HISTORY:  1. Diabetes. 2. Hypertension. 3. Hyperlipidemia.   4. History of hypothyroidism and uses Synthroid.   5. History of coronary artery disease and is status post coronary artery bypass graft in the late 90's.  6. Peripheral vascular disease.   7. Prior thyroidectomy by her report.  8. History of hysterectomy.   SOCIAL HISTORY: She does not smoke. She does not consume alcohol.  FAMILY HISTORY: Positive for coronary artery disease in her brother.   REVIEW OF SYSTEMS: She denies any headaches, fevers, or chills. There is no productive cough or hemoptysis. There is no dysphagia, odynophagia, melena, or hematochezia. There is no dysuria or hematuria. There is no rashes or seizure activity. There is no orthopnea or PND but there is pedal edema. There is  weakness. Remaining systems are negative.   MEDICATIONS:  1. Synthroid 125 mcg daily. 2. Potassium 10 mEq p.o. daily. 3. Melatonin. 4. Lovastatin 40 mg p.o. daily. 5. HCTZ 25 mg p.o. daily. 6. Insulin. 7. Gingko Biloba. 8. Lasix 40 mg p.o. daily. 9. Fish Oil. 10. Aspirin 325 mg p.o. daily. 11. Cozaar 50 mg p.o. daily. 12. Colchicine 0.6 mg p.o. b.i.d.  13. Cardia XT 120 mg daily. 14. Atenolol 25 mg daily.   ALLERGIES: Allergy to codeine and Tylenol.   PHYSICAL EXAMINATION:   VITAL SIGNS: Initial blood pressure was 80 systolic. She is now improved to 119/70. Her initial pulse was 46 and she is now in the 50's.  GENERAL: She is well developed and well nourished in no acute distress. Her skin is warm and dry. She does not appear to be depressed. There is no peripheral clubbing. Her back is normal.   HEENT: Normal. Normal eyelids.   NECK: Supple with a normal upstroke bilaterally. No bruits noted. There is no jugular venous distention. I cannot appreciate thyromegaly.   CHEST: Mild rhonchi but is otherwise clear to auscultation.   CARDIOVASCULAR: Bradycardic rate. There is a 1/6 systolic ejection murmur at the left sternal border.   ABDOMEN: Nontender, nondistended. Positive bowel sounds. No hepatosplenomegaly. No masses appreciated. There is no abdominal bruit. She has femoral pulses that are difficult to palpate. I cannot appreciate bruits.   EXTREMITIES: Chronic skin changes and 1+ edema. Her distal pulses are not palpated. I cannot appreciate cords.   NEUROLOGIC: Grossly intact.   LABORATORY, DIAGNOSTIC, AND RADIOLOGICAL DATA: Sodium 147 with  a potassium of 4.2. BUN and creatinine are 21 and 1.57 respectively. Her LFTs show an albumin of 2.9. Her alkaline phosphatase is 137. Her SGOT is 156 and her SGPT is 144. Her CK total is 281 with an MB of 1.3. Troponin-I is less than 0.02. White blood cell count 11.1, hemoglobin 12.6, hematocrit 37.5, platelet count 203. Her initial  electrocardiogram showed junctional rhythm with an occasional captured beat. Her axis is normal. There is poor R wave progression and a prior anterior infarct cannot be excluded. There are nonspecific ST changes with T wave inversion laterally. A follow-up electrocardiogram after atropine showed a sinus bradycardia at a rate of 51. The axis is normal. There is again poor R wave progression and lateral T wave inversion. Her chest x-ray showed scarring versus small effusion in the left costophrenic angle. There is interstitial prominence possibly reflecting pulmonary edema.   DIAGNOSES:  1. Bradycardia/junctional rhythm. The patient presents with complaints of weakness, increased shortness of breath, and dizziness. She was found to be somewhat hypotensive and was in a junctional rhythm on presentation. This has now improved. Note she is not having chest pain. I think her presentation is most likely related to loss of atrial kick and bradycardia. I would recommend discontinuing her atenolol and Cardizem and watching her on telemetry. Her heart rate should improve. As her heart rate increases and she resumes AV synchrony, her symptoms I think will improve. We will need to watch her blood pressure closely after stopping her Cardizem and atenolol and if her blood pressure increases then she may need additional medications for hypertension with no AV nodal blocking effects. I do not think she is ischemic at this time but I would recommend continuing to cycle enzymes. We would also recommend an echocardiogram to reassess LV function. As her blood pressure is borderline, I would recommend holding all of her antihypertensives at this time.  2. Mild congestive heart failure. The patient was short of breath and there is mild edema on her x-ray. I think this is most likely related to loss of AV synchrony with her junctional rhythm. This should improve after holding her atenolol and Cardizem.  3. Increased liver functions.  Would recommend discontinuing her statin for now. She needs close follow-up of her liver functions. Some of the elevation may be related to hypotension and hypoperfusion.  4. Renal insufficiency. This will be followed and certainly this also may be related to hypoperfusion of her kidneys. Her ARB will be held for now.  5. Coronary disease, status post coronary bypass graft. She will continue on her aspirin.  6. Diabetes mellitus. Management per primary care.   We will be happy to follow while she is in the hospital.   ____________________________ Madolyn Frieze. Jens Som, MD bsc:drc D: 11/30/2011 16:34:42 ET T: 12/01/2011 05:37:22 ET JOB#: [id number]  cc: Madolyn Frieze. Jens Som, MD, <Dictator> Lewayne Bunting MD ELECTRONICALLY SIGNED 12/04/2011 7:45

## 2015-03-04 NOTE — Discharge Summary (Signed)
PATIENT NAME:  [name] [name], [name] [name] MR#:  [id number] DATE OF BIRTH:  [birth year]  DATE OF ADMISSION:  11/30/2011 DATE OF DISCHARGE:  12/01/2011  ADDENDUM:  Completion of dictation.  DISCHARGE MEDICATIONS:  1. Melatonin 1 mg p.o. at bedtime.  2. Fish oil 1 capsule p.o. daily. 3. Ginkgo biloba 1 tablet p.o. daily. 4. Aspirin 325 mg p.o. daily.  5. Potassium chloride 10 mEq p.o. daily.  6. Synthroid 125 mcg p.o. daily.  7. Cozaar 50 mg p.o. daily.  8. Humulin 70/30, 30 units subcutaneous at bedtime and 70 units subcutaneous p.o. daily in the morning.  9. Colchicine 0.6 mg p.o. twice a day.  10. Percocet 5/325 mg 1 tablet p.o. every four hours as needed.  11. Norvasc 5 mg p.o. daily.  12. Lasix 40 mg p.o. daily starting 12/03/2011 as needed for shortness of breath/edema.   DISCHARGE DIET: Low sodium, low cholesterol, 1800 ADA.   DISCHARGE ACTIVITY: As tolerated.   DISCHARGE INSTRUCTIONS:  1. The patient was instructed not to take Cardizem, atenolol, lovastatin, and hydrochlorothiazide.  2. She was also instructed to hold Lasix until 12/02/2011. She can resume Lasix on 12/03/2011 as needed. Lasix has been held due to elevated kidney function. Lovastatin is held due to elevated liver function tests. She will need to hold her hydrochlorothiazide until seen by primary care physician or Dr. Mariah MillingGollan.   DISCHARGE ACTIVITY: As tolerated.   DISCHARGE FOLLOWUP: The patient will need follow-up with [facility], Mr. Mariane MastersMartin Mayer, in 1 to 2 weeks. She will need followup with Dr. Julien Nordmannimothy Gollan [facility] in 1 week. She will get CBC and complete metabolic panel in 1 week with results forwarded to primary care physician and Dr. Mariah MillingGollan.   TOTAL TIME DISCHARGING THIS PATIENT: 55 minutes.  ____________________________ Ellamae SiaVipul S. Sherryll BurgerShah, MD vss:slb D: 12/02/2011 14:34:40 ET T: 12/03/2011 11:33:58 ET JOB#: [id number]  cc: Rual Vermeer S. Sherryll BurgerShah, MD, <Dictator> Burnett HarryMartin G. Stacie AcresMayer, [place] Antonieta Ibaimothy J. Gollan,  MD Ellamae SiaVIPUL S [facility] MD ELECTRONICALLY SIGNED 12/03/2011 23:14

## 2015-04-14 IMAGING — CT CT ABD-PELV W/O CM
2 of 4 series · 16 of 46 positions shown, 18 images · non-contrast
Comparison: Ultrasound [DATE].

CLINICAL DATA: Bleeding.

EXAM:
CT ABDOMEN AND PELVIS WITHOUT CONTRAST
TECHNIQUE: Multidetector CT imaging of the abdomen and pelvis was performed
following the standard protocol without IV contrast.

[Series 2: routine abd pel without · axial · non-contrast · 0.76mm/px · z∈[-1188,-773]mm · 13 of 91 slices shown, 15 images]
[im 4/91  soft-tissue]
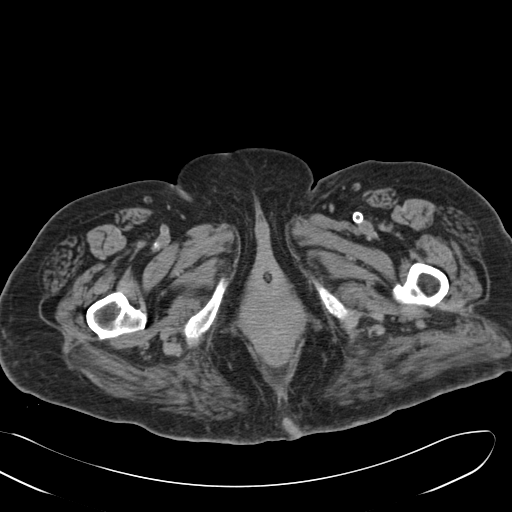
[im 4/91  bone]
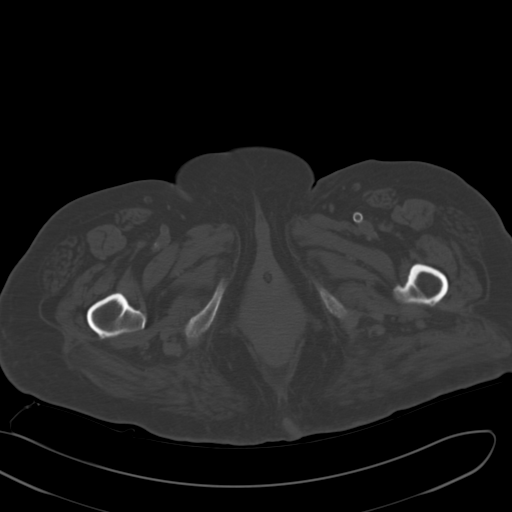
[im 12/91  soft-tissue]
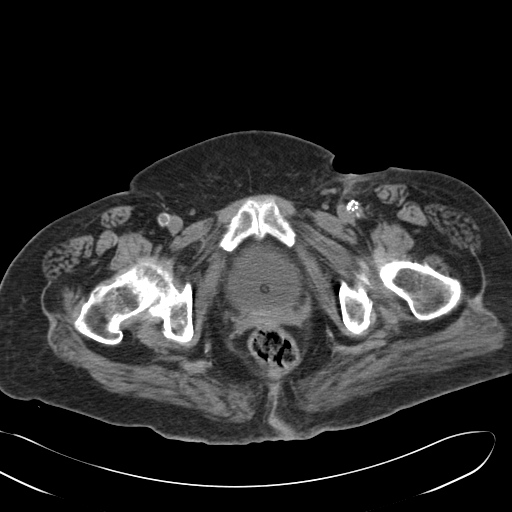
[im 19/91  soft-tissue]
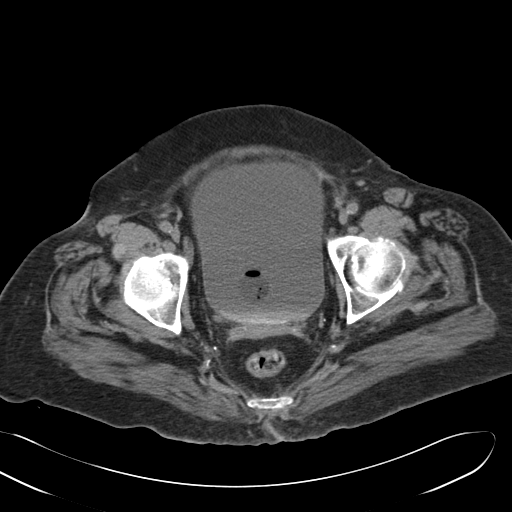
[im 27/91  soft-tissue]
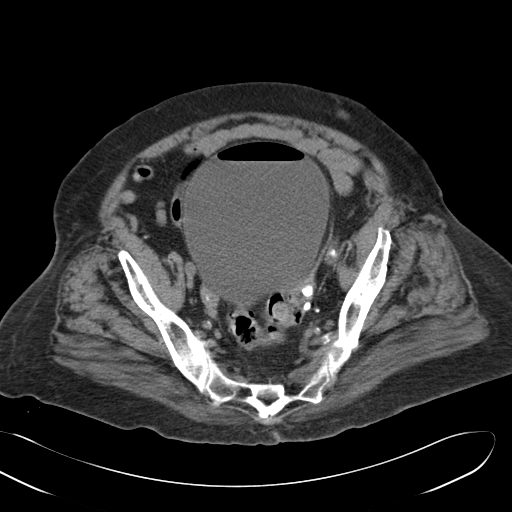
[im 31/91  soft-tissue]
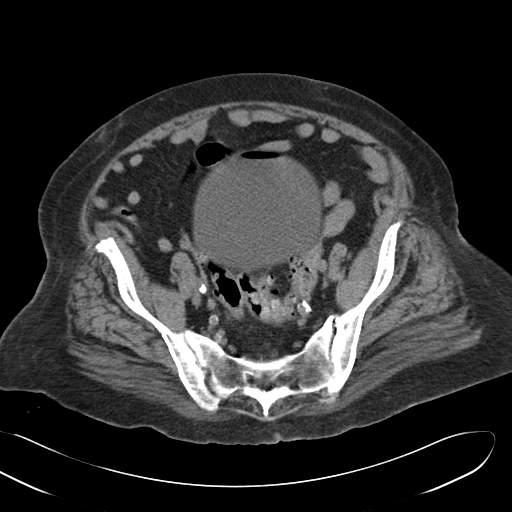
[im 38/91  soft-tissue]
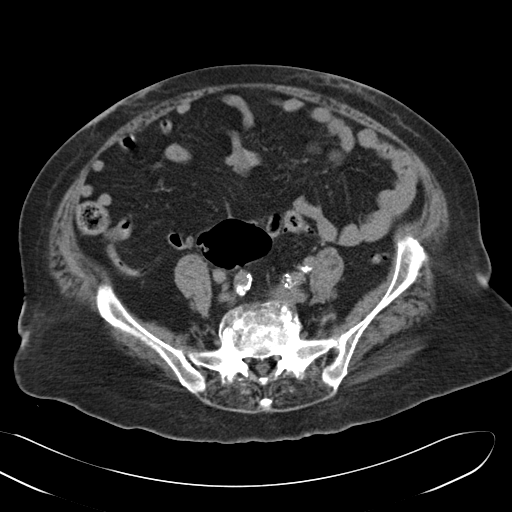
[im 46/91  soft-tissue]
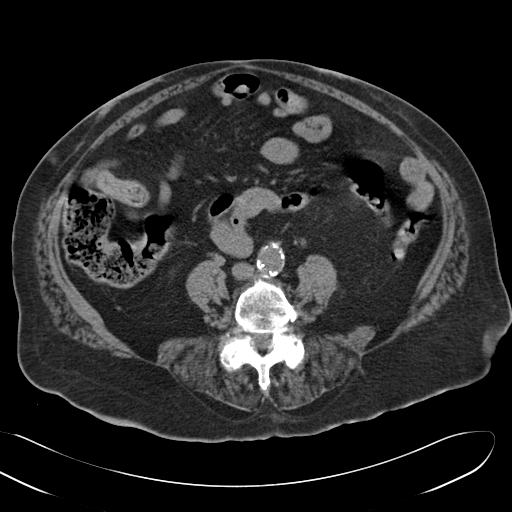
[im 53/91  soft-tissue]
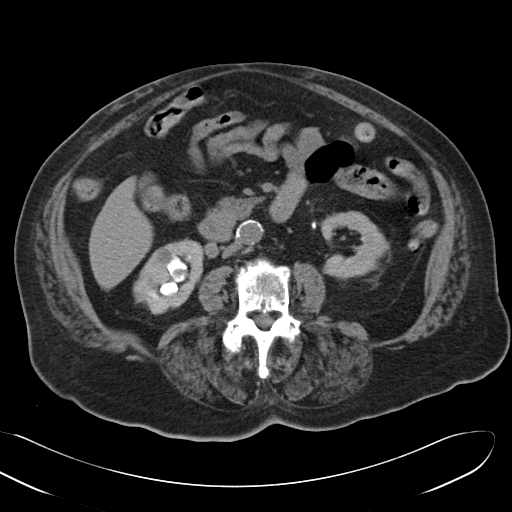
[im 61/91  soft-tissue]
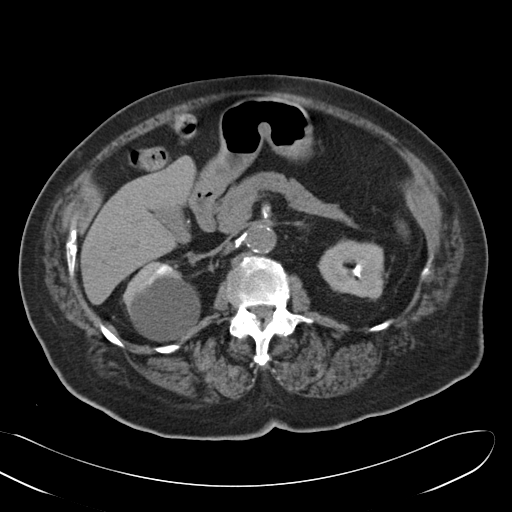
[im 61/91  bone]
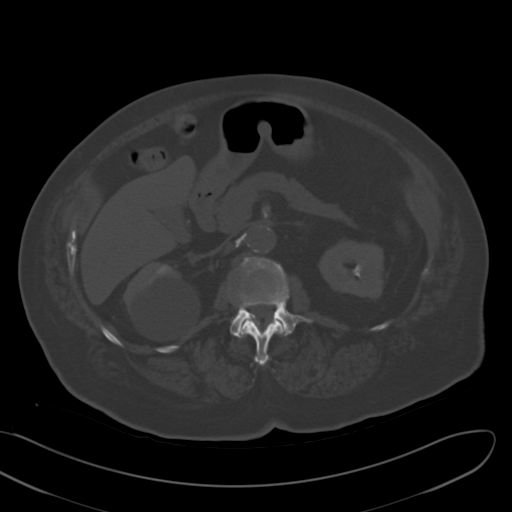
[im 64/91  soft-tissue]
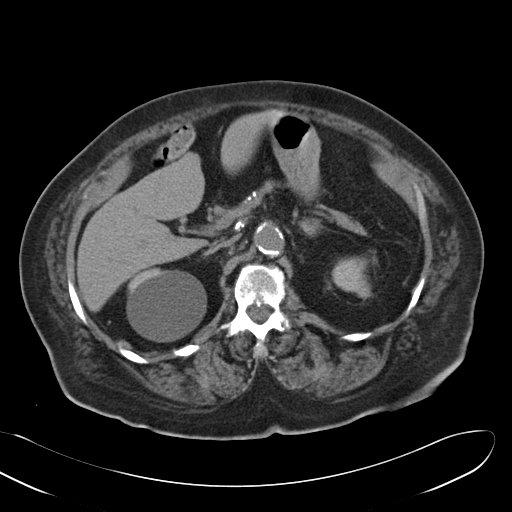
[im 72/91  soft-tissue]
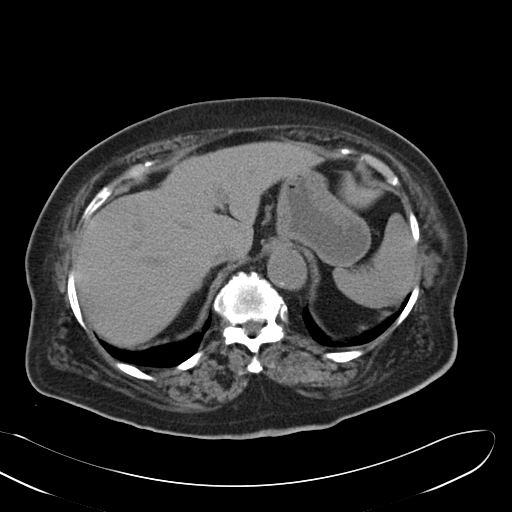
[im 79/91  soft-tissue]
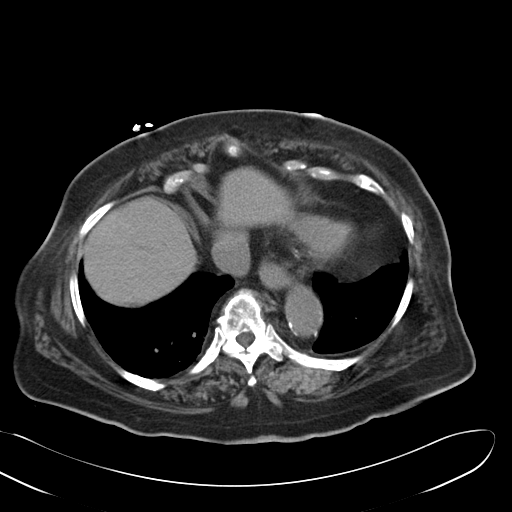
[im 87/91  soft-tissue]
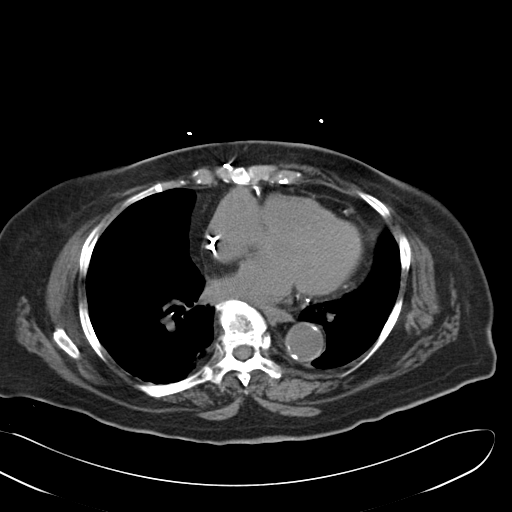

[Series 5: cor routine abd pel wo · coronal · 0.80mm/px · 3 of 145 slices shown]
[im 49/145  soft-tissue]
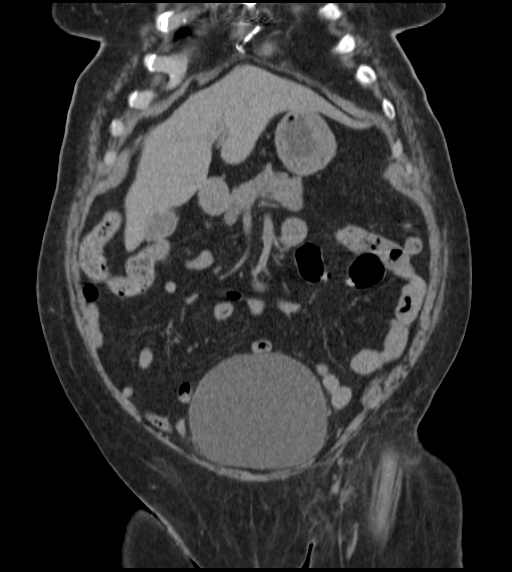
[im 65/145  soft-tissue]
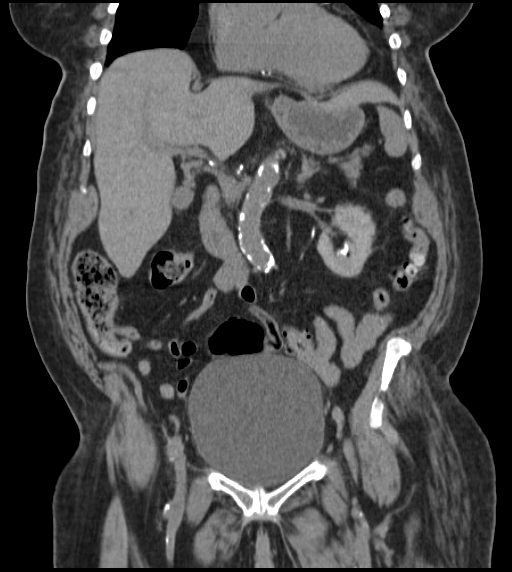
[im 81/145  soft-tissue]
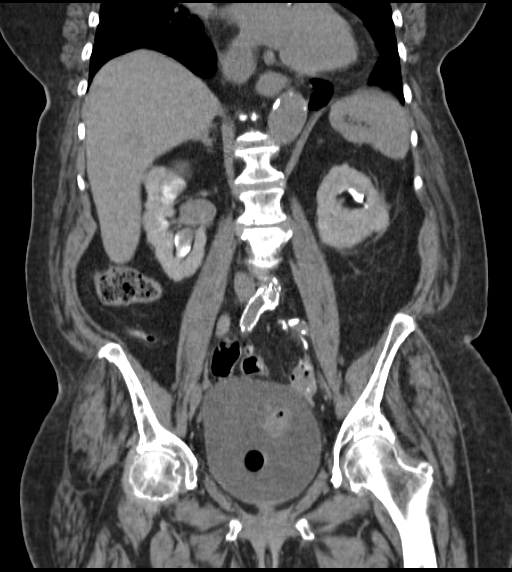

[16 of 46 positions shown; findings below may reference images not displayed]

FINDINGS: Liver normal. Spleen normal. Splenosis. Pancreas normal. No biliary
distention. Gallstones. Gallbladder is nondistended.

Adrenals normal. 6.2 cm simple cyst right kidney. 1.4 cm simple cyst
right kidney. Mild right hydronephrosis noted. Filling defects in
the renal pelvis and calices are noted. This could be related to
infectious or bloody debris. Underlying tumor cannot be excluded.
Hydroureter is present to the level of the distal ureter. No
definite obstructing stone is identified. Innumerable pelvic
calcifications are present. These are most consistent with
phleboliths. The bladder is distended. A Foley catheter is present
in the bladder. Contrast is noted in the bladder. A component of
hemorrhage within the bladder cannot be excluded.

No significant retroperitoneal adenopathy. Abdominal aorta normal
caliber. Aortoiliac atherosclerotic vascular disease. Visceral
atherosclerotic vascular disease. Bilateral iliac stents noted. No
evidence of retroperitoneal hemorrhage. No significant groin
hematoma is noted.

Appendix normal. No evidence of bowel obstruction no free air. Small
hiatal hernia. No mesenteric mass. No significant abdominal wall
hernia.

Mild basilar atelectasis. Coronary artery disease. Prior CABG.
Degenerative changes thoracolumbar spine and both hips.
IMPRESSION: 1. Mild right hydronephrosis. Filling defect in the renal collecting
system and pelvis suggesting infectious or blunting debris.
Underlying tumor cannot be excluded. Mild hydroureter is present to
the level of the distal ureter. No definite obstructing stones
identified.
2. Foley catheter in the bladder. The bladder is distended. Active
bleeding within the bladder may be present.
3. No evidence of retroperitoneal hemorrhage or groin hematoma.

These results were called by telephone at the time of interpretation
on 06/27/2014 at [DATE] to Dr. RUTH NANCY OZUNA , who verbally
acknowledged these results.

## 2015-07-09 ENCOUNTER — Encounter (INDEPENDENT_AMBULATORY_CARE_PROVIDER_SITE_OTHER): Payer: Medicare HMO | Admitting: Ophthalmology

## 2015-07-09 DIAGNOSIS — H34811 Central retinal vein occlusion, right eye: Secondary | ICD-10-CM

## 2015-07-09 DIAGNOSIS — H43813 Vitreous degeneration, bilateral: Secondary | ICD-10-CM

## 2015-07-09 DIAGNOSIS — E11311 Type 2 diabetes mellitus with unspecified diabetic retinopathy with macular edema: Secondary | ICD-10-CM

## 2015-07-09 DIAGNOSIS — H35033 Hypertensive retinopathy, bilateral: Secondary | ICD-10-CM

## 2015-07-09 DIAGNOSIS — E11331 Type 2 diabetes mellitus with moderate nonproliferative diabetic retinopathy with macular edema: Secondary | ICD-10-CM | POA: Diagnosis not present

## 2015-07-09 DIAGNOSIS — E11351 Type 2 diabetes mellitus with proliferative diabetic retinopathy with macular edema: Secondary | ICD-10-CM | POA: Diagnosis not present

## 2015-07-09 DIAGNOSIS — I10 Essential (primary) hypertension: Secondary | ICD-10-CM

## 2015-07-25 ENCOUNTER — Other Ambulatory Visit (INDEPENDENT_AMBULATORY_CARE_PROVIDER_SITE_OTHER): Payer: Medicare HMO | Admitting: Ophthalmology

## 2015-07-25 DIAGNOSIS — E11311 Type 2 diabetes mellitus with unspecified diabetic retinopathy with macular edema: Secondary | ICD-10-CM | POA: Diagnosis not present

## 2015-07-25 DIAGNOSIS — E11351 Type 2 diabetes mellitus with proliferative diabetic retinopathy with macular edema: Secondary | ICD-10-CM

## 2015-07-30 ENCOUNTER — Encounter (INDEPENDENT_AMBULATORY_CARE_PROVIDER_SITE_OTHER): Payer: Medicare PPO | Admitting: Ophthalmology

## 2015-07-30 ENCOUNTER — Encounter (INDEPENDENT_AMBULATORY_CARE_PROVIDER_SITE_OTHER): Payer: Medicare HMO | Admitting: Ophthalmology

## 2015-07-30 DIAGNOSIS — I10 Essential (primary) hypertension: Secondary | ICD-10-CM | POA: Diagnosis not present

## 2015-07-30 DIAGNOSIS — H34811 Central retinal vein occlusion, right eye: Secondary | ICD-10-CM

## 2015-07-30 DIAGNOSIS — E11351 Type 2 diabetes mellitus with proliferative diabetic retinopathy with macular edema: Secondary | ICD-10-CM | POA: Diagnosis not present

## 2015-07-30 DIAGNOSIS — H35033 Hypertensive retinopathy, bilateral: Secondary | ICD-10-CM | POA: Diagnosis not present

## 2015-07-30 DIAGNOSIS — E11331 Type 2 diabetes mellitus with moderate nonproliferative diabetic retinopathy with macular edema: Secondary | ICD-10-CM

## 2015-07-30 DIAGNOSIS — E11311 Type 2 diabetes mellitus with unspecified diabetic retinopathy with macular edema: Secondary | ICD-10-CM

## 2015-07-30 DIAGNOSIS — H43813 Vitreous degeneration, bilateral: Secondary | ICD-10-CM

## 2015-08-27 ENCOUNTER — Encounter (INDEPENDENT_AMBULATORY_CARE_PROVIDER_SITE_OTHER): Payer: Medicare HMO | Admitting: Ophthalmology

## 2015-08-27 DIAGNOSIS — E113312 Type 2 diabetes mellitus with moderate nonproliferative diabetic retinopathy with macular edema, left eye: Secondary | ICD-10-CM | POA: Diagnosis not present

## 2015-08-27 DIAGNOSIS — H35033 Hypertensive retinopathy, bilateral: Secondary | ICD-10-CM

## 2015-08-27 DIAGNOSIS — E11311 Type 2 diabetes mellitus with unspecified diabetic retinopathy with macular edema: Secondary | ICD-10-CM

## 2015-08-27 DIAGNOSIS — E113511 Type 2 diabetes mellitus with proliferative diabetic retinopathy with macular edema, right eye: Secondary | ICD-10-CM

## 2015-08-27 DIAGNOSIS — H43813 Vitreous degeneration, bilateral: Secondary | ICD-10-CM | POA: Diagnosis not present

## 2015-08-27 DIAGNOSIS — H34811 Central retinal vein occlusion, right eye, with macular edema: Secondary | ICD-10-CM | POA: Diagnosis not present

## 2015-08-27 DIAGNOSIS — H348111 Central retinal vein occlusion, right eye, with retinal neovascularization: Secondary | ICD-10-CM | POA: Diagnosis not present

## 2015-09-24 ENCOUNTER — Encounter (INDEPENDENT_AMBULATORY_CARE_PROVIDER_SITE_OTHER): Payer: Medicare HMO | Admitting: Ophthalmology

## 2015-09-24 DIAGNOSIS — E113312 Type 2 diabetes mellitus with moderate nonproliferative diabetic retinopathy with macular edema, left eye: Secondary | ICD-10-CM

## 2015-09-24 DIAGNOSIS — H34811 Central retinal vein occlusion, right eye, with macular edema: Secondary | ICD-10-CM | POA: Diagnosis not present

## 2015-09-24 DIAGNOSIS — I10 Essential (primary) hypertension: Secondary | ICD-10-CM | POA: Diagnosis not present

## 2015-09-24 DIAGNOSIS — H35033 Hypertensive retinopathy, bilateral: Secondary | ICD-10-CM

## 2015-09-24 DIAGNOSIS — E113511 Type 2 diabetes mellitus with proliferative diabetic retinopathy with macular edema, right eye: Secondary | ICD-10-CM

## 2015-09-24 DIAGNOSIS — H43813 Vitreous degeneration, bilateral: Secondary | ICD-10-CM

## 2015-09-24 DIAGNOSIS — E11311 Type 2 diabetes mellitus with unspecified diabetic retinopathy with macular edema: Secondary | ICD-10-CM

## 2015-10-12 ENCOUNTER — Ambulatory Visit (INDEPENDENT_AMBULATORY_CARE_PROVIDER_SITE_OTHER): Payer: Medicare HMO | Admitting: Cardiovascular Disease

## 2015-10-12 ENCOUNTER — Encounter: Payer: Self-pay | Admitting: Cardiovascular Disease

## 2015-10-12 VITALS — BP 166/84 | HR 71 | Ht 63.0 in | Wt 166.0 lb

## 2015-10-12 DIAGNOSIS — I110 Hypertensive heart disease with heart failure: Secondary | ICD-10-CM

## 2015-10-12 DIAGNOSIS — I2510 Atherosclerotic heart disease of native coronary artery without angina pectoris: Secondary | ICD-10-CM | POA: Diagnosis not present

## 2015-10-12 DIAGNOSIS — Z951 Presence of aortocoronary bypass graft: Secondary | ICD-10-CM

## 2015-10-12 DIAGNOSIS — E118 Type 2 diabetes mellitus with unspecified complications: Secondary | ICD-10-CM

## 2015-10-12 DIAGNOSIS — I5032 Chronic diastolic (congestive) heart failure: Secondary | ICD-10-CM | POA: Diagnosis not present

## 2015-10-12 DIAGNOSIS — R000 Tachycardia, unspecified: Secondary | ICD-10-CM

## 2015-10-12 DIAGNOSIS — E785 Hyperlipidemia, unspecified: Secondary | ICD-10-CM

## 2015-10-12 NOTE — Assessment & Plan Note (Signed)
We have ordered a lipid panel for our records, will be drawn today

## 2015-10-12 NOTE — Patient Instructions (Signed)
You are doing well. No medication changes were made.  We will check liver and lipids today (LABS)  Please call us if you have new issues that need to be addressed before your next appt.  Your physician wants you to follow-up in: 6 months.  You will receive a reminder letter in the mail two months in advance. If you don't receive a letter, please call our office to schedule the follow-up appointment.

## 2015-10-12 NOTE — Assessment & Plan Note (Signed)
Currently not on a statin. Lipid panel ordered today will be drawn in the clinic

## 2015-10-12 NOTE — Assessment & Plan Note (Signed)
Blood pressure is well controlled on today's visit. No changes made to the medications. Appears relatively stable

## 2015-10-12 NOTE — Progress Notes (Signed)
Patient ID: [name] [name], female    DOB: December 08, 1924, 81 y.o.   MRN: [id number]  HPI Comments: [name] [name] is a very pleasant 81 year old female with past medical history of coronary artery disease, bypass surgery, hypertension, diabetes, chronic lower extremity edema, Pulmonary hypertension (moderate), hyperlipidemia, peripheral vascular disease. She presents for routine follow-up of her coronary artery disease.  In follow-up today, she presents in a wheelchair. She is living in a nursing home, [facility]. She has chronic lower extremity edema on the right, no leg edema on the left. She does not wear compression hose. Denies any chest pain or shortness of breath. Active, walks with a walker She takes Lasix daily, torsemide only when necessary for weight gain. Review of prior weights shows weight has been stable Blood pressure initially elevated on arrival, recheck blood pressure in the 140 range  EKG on today's visit shows normal sinus rhythm with rate 71 bpm, rare APCs, T-wave abnormality anterolateral leads  Other past medical history Notes from her nursing home show she is receiving torsemide Monday Wednesday Friday for weight more than 162 pounds.  hospitalization  in August 2015.  She had embolization of AV fistula of tibial vessels done at [facility] on 08/18/2015with significant procedure blood loss, hemoglobin into the 5 range. She was not transfused as she was a Jehovah witness. In this setting, she had an MI. Echocardiogram showed essentially normal LV function with inferior wall hypokinesis. Plan was made to manage medically Notes also indicate she had acute respiratory failure, acute kidney injury She denies any significant shortness of breath. She is not walking very much, presents in a wheelchair.   Echocardiogram  06/28/2014 showing ejection fraction 65-70%, entire inferior wall with hypokinesis, moderately dilated left atrium  Other past medical history  presented to [facility]  on November 30, 2011 with hypotension and bradycardia.  She was found to be in a junctional rhythm with heart rate of 46 beats per minute, systolic blood pressure of 80. Her Cardizem was held with improvement of her heart rate and blood pressure.  Baseline creatinine 1.5, elevated liver enzymes.  Echocardiogram December 01, 2011 shows normal systolic function, ejection fraction greater than 55%, mild LVH, mild TR, moderate right ventricular systolic pressures. CT scan of the carotid arteries shows calcification  Allergies  Allergen Reactions  . Acetaminophen     Upset stomach  . Codeine     Outpatient Encounter Prescriptions as of 10/12/2015  Medication Sig  . allopurinol (ZYLOPRIM) 100 MG tablet Take 100 mg by mouth daily.   . Amino Acids-Protein Hydrolys (FEEDING SUPPLEMENT, PRO-STAT SUGAR FREE 64,) LIQD Take 30 mLs by mouth 2 (two) times daily.  . furosemide (LASIX) 20 MG tablet Take 10 mg by mouth daily.  Marland Kitchen gabapentin (NEURONTIN) 300 MG capsule Take 300 mg by mouth 3 (three) times daily.  Marland Kitchen HYDROcodone-acetaminophen (NORCO/VICODIN) 5-325 MG per tablet Take 1 tablet by mouth every 6 (six) hours as needed for moderate pain.  . hydrocortisone (ANUSOL-HC) 2.5 % rectal cream Place 1 application rectally at bedtime.  . insulin aspart (NOVOLOG) 100 UNIT/ML injection Sliding scale  . iron polysaccharides (NIFEREX) 150 MG capsule Take 150 mg by mouth 2 (two) times daily.   . isosorbide mononitrate (IMDUR) 30 MG 24 hr tablet TAKE ONE TABLET BY MOUTH EVERY DAY  . LEVEMIR 100 UNIT/ML injection Inject 50 Units into the skin daily.   Marland Kitchen levothyroxine (SYNTHROID, LEVOTHROID) 112 MCG tablet Take 112 mcg by mouth daily before breakfast.  . LORazepam (ATIVAN)  0.5 MG tablet Take 0.5 mg by mouth every 6 (six) hours as needed for anxiety.  Marland Kitchen. losartan (COZAAR) 100 MG tablet Take 100 mg by mouth daily.  . metFORMIN (GLUCOPHAGE-XR) 750 MG 24 hr tablet Take 750 mg by mouth daily with breakfast.  . metoprolol  tartrate (LOPRESSOR) 25 MG tablet Take 25 mg by mouth 2 (two) times daily.   . nitroGLYCERIN (NITROSTAT) 0.4 MG SL tablet Place 0.4 mg under the tongue every 5 (five) minutes as needed for chest pain.  [name] [name]. ONETOUCH DELICA LANCETS 33G MISC daily.   [name] [name]. ONETOUCH VERIO test strip daily.   . polyethylene glycol (MIRALAX / GLYCOLAX) packet Take 17 g by mouth daily.  . potassium chloride (K-DUR) 10 MEQ tablet Take one tablet as directed when taking Torsemide.  . torsemide (DEMADEX) 20 MG tablet Take one tablet on Monday, Wednesday and Friday or as needed with weight gain.  . [DISCONTINUED] aspirin 81 MG tablet Take 81 mg by mouth daily.  . [DISCONTINUED] levothyroxine (SYNTHROID, LEVOTHROID) 125 MCG tablet Take 125 mcg by mouth daily before breakfast.  . [DISCONTINUED] potassium chloride (K-DUR) 10 MEQ tablet 10 mEq once daily.   No facility-administered encounter medications on file as of 10/12/2015.    Past Medical History  Diagnosis Date  . Hypertension   . Diabetes mellitus   . Hyperlipidemia   . Hypothyroidism   . PVD (peripheral vascular disease) (HCC)   . Coronary artery disease     a. s/p CABG in [place] - late 90's/early 2000's;  b. s/p stenting  . Liver cyst   . Blood clot of neck vein   . Chronic diastolic CHF (congestive heart failure) (HCC)     a. December 01, 2011 shows normal systolic function, ejection fraction greater than 55%, mild LVH, mild TR, moderate right ventricular systolic pressures.  Marland Kitchen. DVT (deep venous thrombosis) (HCC)     a. RLE - chronic xarelto.    Past Surgical History  Procedure Laterality Date  . Angioplasty      right leg  . Thyroidectomy    . Cardiac catheterization    . Coronary artery bypass graft  1999    Social History  reports that she quit smoking about 17 years ago. Her smoking use included Cigarettes. She smoked 1.00 pack per day. She does not have any smokeless tobacco history on file. She reports that she does not drink alcohol or use illicit  drugs.  Family History Family history is unknown by patient.  Review of Systems  Constitutional: Negative.   Respiratory: Negative.   Cardiovascular: Positive for leg swelling.       Right leg swelling  Gastrointestinal: Negative.   Musculoskeletal: Positive for back pain, arthralgias and gait problem.  Neurological: Negative.   All other systems reviewed and are negative.   BP 166/84 mmHg  Pulse 71  Ht 5\' 3"  (1.6 m)  Wt 166 lb (75.297 kg)  BMI 29.41 kg/m2 Repeat blood pressure 140 systolic Physical Exam  Constitutional: She is oriented to person, place, and time. She appears well-developed and well-nourished.  HENT:  Head: Normocephalic.  Nose: Nose normal.  Mouth/Throat: Oropharynx is clear and moist.  Eyes: Conjunctivae are normal. Pupils are equal, round, and reactive to light.  Neck: Normal range of motion. Neck supple. No JVD present.  Cardiovascular: Normal rate, regular rhythm, S1 normal, S2 normal, normal heart sounds and intact distal pulses.  Exam reveals no gallop and no friction rub.   No murmur heard. Right lower extremity  edema, trace to 1+ pitting  Pulmonary/Chest: Effort normal and breath sounds normal. No respiratory distress. She has no wheezes. She has no rales. She exhibits no tenderness.  Abdominal: Soft. Bowel sounds are normal. She exhibits no distension. There is no tenderness.  Musculoskeletal: Normal range of motion. She exhibits edema. She exhibits no tenderness.  Lymphadenopathy:    She has no cervical adenopathy.  Neurological: She is alert and oriented to person, place, and time. Coordination normal.  Skin: Skin is warm and dry. No rash noted. No erythema.  Psychiatric: She has a normal mood and affect. Her behavior is normal. Judgment and thought content normal.    Assessment and Plan  Nursing note and vitals reviewed.

## 2015-10-12 NOTE — Assessment & Plan Note (Signed)
We have encouraged continued exercise, careful diet management in an effort to lose weight. 

## 2015-10-12 NOTE — Assessment & Plan Note (Signed)
Weight has been stable, takes Lasix daily, torsemide as needed for weight gain

## 2015-10-12 NOTE — Assessment & Plan Note (Signed)
As above, no anginal symptoms. No further testing indicated

## 2015-10-13 LAB — HEPATIC FUNCTION PANEL
ALT: 8 IU/L (ref 0–32)
AST: 14 IU/L (ref 0–40)
Albumin: 3.6 g/dL (ref 3.5–4.7)
Alkaline Phosphatase: 158 IU/L — ABNORMAL HIGH (ref 39–117)
BILIRUBIN TOTAL: 0.4 mg/dL (ref 0.0–1.2)
BILIRUBIN, DIRECT: 0.09 mg/dL (ref 0.00–0.40)
Total Protein: 6.2 g/dL (ref 6.0–8.5)

## 2015-10-13 LAB — LIPID PANEL
CHOL/HDL RATIO: 6.1 ratio — AB (ref 0.0–4.4)
Cholesterol, Total: 188 mg/dL (ref 100–199)
HDL: 31 mg/dL — ABNORMAL LOW (ref >39)
LDL Calculated: 122 mg/dL — ABNORMAL HIGH (ref 0–99)
Triglycerides: 176 mg/dL — ABNORMAL HIGH (ref 0–149)
VLDL Cholesterol Cal: 35 mg/dL (ref 5–40)

## 2015-10-15 ENCOUNTER — Other Ambulatory Visit: Payer: Self-pay

## 2015-10-15 MED ORDER — ATORVASTATIN CALCIUM 40 MG PO TABS: 40.0000 mg | ORAL_TABLET | Freq: Every day | ORAL | Status: DC

## 2015-10-22 ENCOUNTER — Encounter (INDEPENDENT_AMBULATORY_CARE_PROVIDER_SITE_OTHER): Payer: Medicare HMO | Admitting: Ophthalmology

## 2015-10-22 DIAGNOSIS — H43813 Vitreous degeneration, bilateral: Secondary | ICD-10-CM | POA: Diagnosis not present

## 2015-10-22 DIAGNOSIS — E113312 Type 2 diabetes mellitus with moderate nonproliferative diabetic retinopathy with macular edema, left eye: Secondary | ICD-10-CM | POA: Diagnosis not present

## 2015-10-22 DIAGNOSIS — I10 Essential (primary) hypertension: Secondary | ICD-10-CM | POA: Diagnosis not present

## 2015-10-22 DIAGNOSIS — H34811 Central retinal vein occlusion, right eye, with macular edema: Secondary | ICD-10-CM | POA: Diagnosis not present

## 2015-10-22 DIAGNOSIS — E11311 Type 2 diabetes mellitus with unspecified diabetic retinopathy with macular edema: Secondary | ICD-10-CM | POA: Diagnosis not present

## 2015-10-22 DIAGNOSIS — H35033 Hypertensive retinopathy, bilateral: Secondary | ICD-10-CM

## 2015-10-22 DIAGNOSIS — E113511 Type 2 diabetes mellitus with proliferative diabetic retinopathy with macular edema, right eye: Secondary | ICD-10-CM | POA: Diagnosis not present

## 2015-12-03 ENCOUNTER — Encounter (INDEPENDENT_AMBULATORY_CARE_PROVIDER_SITE_OTHER): Payer: Medicare HMO | Admitting: Ophthalmology

## 2015-12-03 DIAGNOSIS — E113312 Type 2 diabetes mellitus with moderate nonproliferative diabetic retinopathy with macular edema, left eye: Secondary | ICD-10-CM | POA: Diagnosis not present

## 2015-12-03 DIAGNOSIS — H35033 Hypertensive retinopathy, bilateral: Secondary | ICD-10-CM

## 2015-12-03 DIAGNOSIS — E113511 Type 2 diabetes mellitus with proliferative diabetic retinopathy with macular edema, right eye: Secondary | ICD-10-CM

## 2015-12-03 DIAGNOSIS — H43813 Vitreous degeneration, bilateral: Secondary | ICD-10-CM

## 2015-12-03 DIAGNOSIS — E11311 Type 2 diabetes mellitus with unspecified diabetic retinopathy with macular edema: Secondary | ICD-10-CM | POA: Diagnosis not present

## 2015-12-03 DIAGNOSIS — I10 Essential (primary) hypertension: Secondary | ICD-10-CM | POA: Diagnosis not present

## 2016-01-14 ENCOUNTER — Encounter (INDEPENDENT_AMBULATORY_CARE_PROVIDER_SITE_OTHER): Payer: Medicare HMO | Admitting: Ophthalmology

## 2016-01-14 DIAGNOSIS — H43813 Vitreous degeneration, bilateral: Secondary | ICD-10-CM

## 2016-01-14 DIAGNOSIS — E113312 Type 2 diabetes mellitus with moderate nonproliferative diabetic retinopathy with macular edema, left eye: Secondary | ICD-10-CM

## 2016-01-14 DIAGNOSIS — I10 Essential (primary) hypertension: Secondary | ICD-10-CM | POA: Diagnosis not present

## 2016-01-14 DIAGNOSIS — E113511 Type 2 diabetes mellitus with proliferative diabetic retinopathy with macular edema, right eye: Secondary | ICD-10-CM

## 2016-01-14 DIAGNOSIS — H34811 Central retinal vein occlusion, right eye, with macular edema: Secondary | ICD-10-CM | POA: Diagnosis not present

## 2016-01-14 DIAGNOSIS — H35033 Hypertensive retinopathy, bilateral: Secondary | ICD-10-CM

## 2016-01-14 DIAGNOSIS — E11311 Type 2 diabetes mellitus with unspecified diabetic retinopathy with macular edema: Secondary | ICD-10-CM

## 2016-02-25 ENCOUNTER — Encounter (INDEPENDENT_AMBULATORY_CARE_PROVIDER_SITE_OTHER): Payer: Medicare HMO | Admitting: Ophthalmology

## 2016-02-25 DIAGNOSIS — H35033 Hypertensive retinopathy, bilateral: Secondary | ICD-10-CM | POA: Diagnosis not present

## 2016-02-25 DIAGNOSIS — H34811 Central retinal vein occlusion, right eye, with macular edema: Secondary | ICD-10-CM | POA: Diagnosis not present

## 2016-02-25 DIAGNOSIS — I10 Essential (primary) hypertension: Secondary | ICD-10-CM | POA: Diagnosis not present

## 2016-02-25 DIAGNOSIS — H43813 Vitreous degeneration, bilateral: Secondary | ICD-10-CM | POA: Diagnosis not present

## 2016-02-25 DIAGNOSIS — E11311 Type 2 diabetes mellitus with unspecified diabetic retinopathy with macular edema: Secondary | ICD-10-CM

## 2016-02-25 DIAGNOSIS — E113511 Type 2 diabetes mellitus with proliferative diabetic retinopathy with macular edema, right eye: Secondary | ICD-10-CM

## 2016-02-25 DIAGNOSIS — E113312 Type 2 diabetes mellitus with moderate nonproliferative diabetic retinopathy with macular edema, left eye: Secondary | ICD-10-CM | POA: Diagnosis not present

## 2016-04-11 ENCOUNTER — Ambulatory Visit (INDEPENDENT_AMBULATORY_CARE_PROVIDER_SITE_OTHER): Payer: Medicare HMO | Admitting: Cardiovascular Disease

## 2016-04-11 ENCOUNTER — Encounter: Payer: Self-pay | Admitting: Cardiovascular Disease

## 2016-04-11 VITALS — BP 130/80 | HR 71 | Ht 63.0 in | Wt 160.1 lb

## 2016-04-11 DIAGNOSIS — R600 Localized edema: Secondary | ICD-10-CM | POA: Diagnosis not present

## 2016-04-11 DIAGNOSIS — Z951 Presence of aortocoronary bypass graft: Secondary | ICD-10-CM

## 2016-04-11 DIAGNOSIS — I10 Essential (primary) hypertension: Secondary | ICD-10-CM | POA: Diagnosis not present

## 2016-04-11 DIAGNOSIS — I2510 Atherosclerotic heart disease of native coronary artery without angina pectoris: Secondary | ICD-10-CM | POA: Diagnosis not present

## 2016-04-11 DIAGNOSIS — E118 Type 2 diabetes mellitus with unspecified complications: Secondary | ICD-10-CM

## 2016-04-11 DIAGNOSIS — I5032 Chronic diastolic (congestive) heart failure: Secondary | ICD-10-CM | POA: Diagnosis not present

## 2016-04-11 NOTE — Patient Instructions (Addendum)
You are doing well. No medication changes were made.  We will request TED/compression daily Labs today, liver and lipids  Please call us if you have new issues that need to be addressed before your next appt.  Your physician wants you to follow-up in: 6 months.  You will receive a reminder letter in the mail two months in advance. If you don't receive a letter, please call our office to schedule the follow-up appointment.

## 2016-04-11 NOTE — Progress Notes (Signed)
Patient ID: [name] [name], female   DOB: Oct 12, 1925, 82 y.o.   MRN: [id number] Cardiology Office Note  Date:  04/11/2016   ID:  [name] [name], DOB Oct 12, 1925, MRN [id number]  PCP:  Dorothey BasemanAVID BRONSTEIN, MD   Chief Complaint  Patient presents with  . other    6 month follow up. Meds reviewed by the pt's med list. "doing well."     HPI:  [name] [name] is a very pleasant 82 year old female with past medical history of coronary artery disease, bypass surgery, hypertension, diabetes, chronic lower extremity edema, Pulmonary hypertension (moderate), hyperlipidemia, peripheral vascular disease. She presents for routine follow-up of her coronary artery disease.  In follow-up today, she presents in a wheelchair. She is living in a nursing home, [facility]. She reports that she does not walk even with a walker, unable to transfer from wheelchair to bed She has chronic lower extremity edema on the right, no leg edema on the left. She does not wear compression hose. Denies any chest pain or shortness of breath. She takes Lasix daily, torsemide only when necessary for weight gain above 162 pounds No weights provided to us today apart from most recent weight May 29, 160 pounds Weight in her office was 166 pounds on her last clinic visit  takes Lasix 10 mg daily.   EKG on today's visit shows normal sinus rhythm with rate 71 bpm,  T-wave abnormality anterolateral leads  no change from prior EKG   Other past medical history Notes from her nursing home show she is receiving torsemide Monday Wednesday Friday for weight more than 162 pounds.  hospitalization in August 2015.  She had embolization of AV fistula of tibial vessels done [facility] on 08/18/2015with significant procedure blood loss, hemoglobin into the 5 range. She was not transfused as she was a Jehovah witness. In this setting, she had an MI. Echocardiogram showed essentially normal LV function with inferior wall hypokinesis. Plan was made  to manage medically Notes also indicate she had acute respiratory failure, acute kidney injury She denies any significant shortness of breath. She is not walking very much, presents in a wheelchair.   Echocardiogram 06/28/2014 showing ejection fraction 65-70%, entire inferior wall with hypokinesis, moderately dilated left atrium  Other past medical history presented to [facility] on November 30, 2011 with hypotension and bradycardia. She was found to be in a junctional rhythm with heart rate of 46 beats per minute, systolic blood pressure of 80. Her Cardizem was held with improvement of her heart rate and blood pressure.  Baseline creatinine 1.5, elevated liver enzymes.  Echocardiogram December 01, 2011 shows normal systolic function, ejection fraction greater than 55%, mild LVH, mild TR, moderate right ventricular systolic pressures. CT scan of the carotid arteries shows calcification  PMH:   has a past medical history of Hypertension; Diabetes mellitus; Hyperlipidemia; Hypothyroidism; PVD (peripheral vascular disease) (HCC); Coronary artery disease; Liver cyst; Blood clot of neck vein; Chronic diastolic CHF (congestive heart failure) (HCC); and DVT (deep venous thrombosis) (HCC).  PSH:    Past Surgical History  Procedure Laterality Date  . Angioplasty      right leg  . Thyroidectomy    . Cardiac catheterization    . Coronary artery bypass graft  1999    Current Outpatient Prescriptions  Medication Sig Dispense Refill  . allopurinol (ZYLOPRIM) 100 MG tablet Take 100 mg by mouth daily.     . Amino Acids-Protein Hydrolys (FEEDING SUPPLEMENT, PRO-STAT SUGAR FREE 64,) LIQD Take 30 mLs by  mouth 2 (two) times daily.    Marland Kitchen aspirin EC 81 MG tablet Take 81 mg by mouth daily.     Marland Kitchen atorvastatin (LIPITOR) 40 MG tablet Take 1 tablet (40 mg total) by mouth daily. 90 tablet 3  . furosemide (LASIX) 20 MG tablet Take 10 mg by mouth daily.    Marland Kitchen HYDROcodone-acetaminophen (NORCO/VICODIN) 5-325 MG per tablet  Take 1 tablet by mouth every 6 (six) hours as needed for moderate pain.    . hydrocortisone (ANUSOL-HC) 2.5 % rectal cream Place 1 application rectally at bedtime.    . insulin aspart (NOVOLOG) 100 UNIT/ML injection Sliding scale    . iron polysaccharides (NIFEREX) 150 MG capsule Take 150 mg by mouth 2 (two) times daily.     . isosorbide mononitrate (IMDUR) 30 MG 24 hr tablet TAKE ONE TABLET BY MOUTH EVERY DAY 90 tablet 3  . LEVEMIR 100 UNIT/ML injection Inject 50 Units into the skin daily.     Marland Kitchen levothyroxine (SYNTHROID, LEVOTHROID) 100 MCG tablet Take 100 mcg by mouth daily before breakfast.    . losartan (COZAAR) 100 MG tablet Take 100 mg by mouth daily.    . metFORMIN (GLUCOPHAGE-XR) 750 MG 24 hr tablet Take 750 mg by mouth daily with breakfast.    . metoprolol (LOPRESSOR) 50 MG tablet Take 50 mg by mouth 2 (two) times daily.    . nitroGLYCERIN (NITROSTAT) 0.4 MG SL tablet Place 0.4 mg under the tongue every 5 (five) minutes as needed for chest pain.    [name] DELICA LANCETS 33G MISC daily.     [name] VERIO test strip daily.     . polyethylene glycol (MIRALAX / GLYCOLAX) packet Take 17 g by mouth daily.    . potassium chloride (K-DUR) 10 MEQ tablet Take one tablet as directed when taking Torsemide.    . torsemide (DEMADEX) 20 MG tablet Take one tablet on Monday, Wednesday and Friday if weight >162 pounds     No current facility-administered medications for this visit.     Allergies:   Acetaminophen and Codeine   Social History:  The patient  reports that she quit smoking about 18 years ago. Her smoking use included Cigarettes. She smoked 1.00 pack per day. She does not have any smokeless tobacco history on file. She reports that she does not drink alcohol or use illicit drugs.   Family History:   Family history is unknown by patient.    Review of Systems: Review of Systems  Respiratory: Negative.   Cardiovascular: Positive for leg swelling.  Gastrointestinal: Negative.    Musculoskeletal: Negative.   Neurological: Positive for weakness.  Psychiatric/Behavioral: Negative.   All other systems reviewed and are negative.    PHYSICAL EXAM: VS:  BP 130/80 mmHg  Pulse 71  Ht 5\' 3"  (1.6 m)  Wt 160 lb 2 oz (72.632 kg)  BMI 28.37 kg/m2 , BMI Body mass index is 28.37 kg/(m^2). GEN: Well nourished, well developed, in no acute distress, Presenting in a wheelchair, unable to walk or transfer  HEENT: normal Neck: no JVD, carotid bruits, or masses Cardiac: RRR; no murmurs, rubs, or gallops,  trace edema right lower extremity mid shin, trace edema left ankle Respiratory:  clear to auscultation bilaterally, normal work of breathing GI: soft, nontender, nondistended, + BS MS: no deformity or atrophy Skin: warm and dry, no rash Neuro:  Strength and sensation are intact Psych: euthymic mood, full affect    Recent Labs: 10/12/2015: ALT 8  Repeat lipid panel  drawn today   Lipid Panel Lab Results  Component Value Date   CHOL 188 10/12/2015   HDL 31* 10/12/2015   LDLCALC 122* 10/12/2015   TRIG 176* 10/12/2015      Wt Readings from Last 3 Encounters:  04/11/16 160 lb 2 oz (72.632 kg)  10/12/15 166 lb (75.297 kg)  12/15/14 161 lb 12 oz (73.369 kg)       ASSESSMENT AND PLAN:  Essential hypertension - Plan: EKG 12-Lead, Hepatic function panel, Lipid Profile  Chronic diastolic CHF (congestive heart failure) (HCC) -  Appears relatively euvolemic on today's visit No changes to her medications, weight is down from prior clinic visit Plan: EKG 12-Lead, Hepatic function panel, Lipid Profile  Coronary artery disease involving native coronary artery of native heart without angina pectoris -  Currently with no symptoms of angina. No further workup at this time. Continue current medication regimen. Lipid panel ordered today after starting Lipitor 6 months ago  Localized edema Stable lower extremity edema She is immobile, high risk of DVT, recommended she  wear compression hose daily  S/P CABG (coronary artery bypass graft) Currently with no symptoms of angina Immobile, very sedentary EKG unchanged  Type 2 diabetes mellitus with complication, without long-term current use of insulin (HCC) We have encouraged continued exercise, careful diet management    Disposition:   F/U  6 months  Nursing home records reviewed in detail  Total encounter time more than 25 minutes  Greater than 50% was spent in counseling and coordination of care with the patient    Orders Placed This Encounter  Procedures  . Hepatic function panel  . Lipid Profile  . EKG 12-Lead     Signed, Dossie Arbour, M.D., Ph.D. 04/11/2016  [facility] [place], [place] [phone]  \

## 2016-04-12 LAB — LIPID PANEL
CHOL/HDL RATIO: 3.2 ratio (ref 0.0–4.4)
CHOLESTEROL TOTAL: 114 mg/dL (ref 100–199)
HDL: 36 mg/dL — AB (ref >39)
LDL Calculated: 52 mg/dL (ref 0–99)
TRIGLYCERIDES: 130 mg/dL (ref 0–149)
VLDL Cholesterol Cal: 26 mg/dL (ref 5–40)

## 2016-04-12 LAB — HEPATIC FUNCTION PANEL
ALBUMIN: 3.7 g/dL (ref 3.5–4.7)
ALK PHOS: 189 IU/L — AB (ref 39–117)
ALT: 9 IU/L (ref 0–32)
AST: 14 IU/L (ref 0–40)
Bilirubin Total: 0.5 mg/dL (ref 0.0–1.2)
Bilirubin, Direct: 0.13 mg/dL (ref 0.00–0.40)
TOTAL PROTEIN: 6.2 g/dL (ref 6.0–8.5)

## 2016-04-14 ENCOUNTER — Encounter (INDEPENDENT_AMBULATORY_CARE_PROVIDER_SITE_OTHER): Payer: Medicare HMO | Admitting: Ophthalmology

## 2016-04-14 DIAGNOSIS — E113511 Type 2 diabetes mellitus with proliferative diabetic retinopathy with macular edema, right eye: Secondary | ICD-10-CM | POA: Diagnosis not present

## 2016-04-14 DIAGNOSIS — H35033 Hypertensive retinopathy, bilateral: Secondary | ICD-10-CM | POA: Diagnosis not present

## 2016-04-14 DIAGNOSIS — I10 Essential (primary) hypertension: Secondary | ICD-10-CM | POA: Diagnosis not present

## 2016-04-14 DIAGNOSIS — E11311 Type 2 diabetes mellitus with unspecified diabetic retinopathy with macular edema: Secondary | ICD-10-CM

## 2016-04-14 DIAGNOSIS — E113312 Type 2 diabetes mellitus with moderate nonproliferative diabetic retinopathy with macular edema, left eye: Secondary | ICD-10-CM

## 2016-04-14 DIAGNOSIS — H43813 Vitreous degeneration, bilateral: Secondary | ICD-10-CM | POA: Diagnosis not present

## 2016-04-14 DIAGNOSIS — H34812 Central retinal vein occlusion, left eye, with macular edema: Secondary | ICD-10-CM | POA: Diagnosis not present

## 2016-06-04 ENCOUNTER — Encounter (INDEPENDENT_AMBULATORY_CARE_PROVIDER_SITE_OTHER): Payer: Medicare HMO | Admitting: Ophthalmology

## 2016-06-04 DIAGNOSIS — H34811 Central retinal vein occlusion, right eye, with macular edema: Secondary | ICD-10-CM | POA: Diagnosis not present

## 2016-06-04 DIAGNOSIS — E113511 Type 2 diabetes mellitus with proliferative diabetic retinopathy with macular edema, right eye: Secondary | ICD-10-CM

## 2016-06-04 DIAGNOSIS — I10 Essential (primary) hypertension: Secondary | ICD-10-CM

## 2016-06-04 DIAGNOSIS — E11311 Type 2 diabetes mellitus with unspecified diabetic retinopathy with macular edema: Secondary | ICD-10-CM

## 2016-06-04 DIAGNOSIS — H43813 Vitreous degeneration, bilateral: Secondary | ICD-10-CM | POA: Diagnosis not present

## 2016-06-04 DIAGNOSIS — H35033 Hypertensive retinopathy, bilateral: Secondary | ICD-10-CM | POA: Diagnosis not present

## 2016-06-04 DIAGNOSIS — E113392 Type 2 diabetes mellitus with moderate nonproliferative diabetic retinopathy without macular edema, left eye: Secondary | ICD-10-CM

## 2016-07-23 ENCOUNTER — Encounter (INDEPENDENT_AMBULATORY_CARE_PROVIDER_SITE_OTHER): Payer: Medicare HMO | Admitting: Ophthalmology

## 2016-07-23 DIAGNOSIS — H34811 Central retinal vein occlusion, right eye, with macular edema: Secondary | ICD-10-CM

## 2016-07-23 DIAGNOSIS — E113392 Type 2 diabetes mellitus with moderate nonproliferative diabetic retinopathy without macular edema, left eye: Secondary | ICD-10-CM

## 2016-07-23 DIAGNOSIS — E113511 Type 2 diabetes mellitus with proliferative diabetic retinopathy with macular edema, right eye: Secondary | ICD-10-CM

## 2016-07-23 DIAGNOSIS — H35033 Hypertensive retinopathy, bilateral: Secondary | ICD-10-CM | POA: Diagnosis not present

## 2016-07-23 DIAGNOSIS — H43813 Vitreous degeneration, bilateral: Secondary | ICD-10-CM | POA: Diagnosis not present

## 2016-07-23 DIAGNOSIS — E11311 Type 2 diabetes mellitus with unspecified diabetic retinopathy with macular edema: Secondary | ICD-10-CM

## 2016-07-23 DIAGNOSIS — I10 Essential (primary) hypertension: Secondary | ICD-10-CM | POA: Diagnosis not present

## 2016-09-10 ENCOUNTER — Encounter (INDEPENDENT_AMBULATORY_CARE_PROVIDER_SITE_OTHER): Payer: Medicare HMO | Admitting: Ophthalmology

## 2016-09-10 DIAGNOSIS — I10 Essential (primary) hypertension: Secondary | ICD-10-CM | POA: Diagnosis not present

## 2016-09-10 DIAGNOSIS — H43813 Vitreous degeneration, bilateral: Secondary | ICD-10-CM

## 2016-09-10 DIAGNOSIS — H35033 Hypertensive retinopathy, bilateral: Secondary | ICD-10-CM

## 2016-09-10 DIAGNOSIS — E113392 Type 2 diabetes mellitus with moderate nonproliferative diabetic retinopathy without macular edema, left eye: Secondary | ICD-10-CM

## 2016-09-10 DIAGNOSIS — H34811 Central retinal vein occlusion, right eye, with macular edema: Secondary | ICD-10-CM | POA: Diagnosis not present

## 2016-09-10 DIAGNOSIS — E113511 Type 2 diabetes mellitus with proliferative diabetic retinopathy with macular edema, right eye: Secondary | ICD-10-CM

## 2016-09-10 DIAGNOSIS — E11311 Type 2 diabetes mellitus with unspecified diabetic retinopathy with macular edema: Secondary | ICD-10-CM | POA: Diagnosis not present

## 2016-10-29 ENCOUNTER — Encounter (INDEPENDENT_AMBULATORY_CARE_PROVIDER_SITE_OTHER): Payer: Medicare HMO | Admitting: Ophthalmology

## 2016-10-29 DIAGNOSIS — H35033 Hypertensive retinopathy, bilateral: Secondary | ICD-10-CM | POA: Diagnosis not present

## 2016-10-29 DIAGNOSIS — I10 Essential (primary) hypertension: Secondary | ICD-10-CM | POA: Diagnosis not present

## 2016-10-29 DIAGNOSIS — H43813 Vitreous degeneration, bilateral: Secondary | ICD-10-CM | POA: Diagnosis not present

## 2016-10-29 DIAGNOSIS — E11311 Type 2 diabetes mellitus with unspecified diabetic retinopathy with macular edema: Secondary | ICD-10-CM

## 2016-10-29 DIAGNOSIS — H34811 Central retinal vein occlusion, right eye, with macular edema: Secondary | ICD-10-CM | POA: Diagnosis not present

## 2016-10-29 DIAGNOSIS — E113392 Type 2 diabetes mellitus with moderate nonproliferative diabetic retinopathy without macular edema, left eye: Secondary | ICD-10-CM

## 2016-10-29 DIAGNOSIS — E113511 Type 2 diabetes mellitus with proliferative diabetic retinopathy with macular edema, right eye: Secondary | ICD-10-CM

## 2016-12-01 ENCOUNTER — Other Ambulatory Visit
Admission: RE | Admit: 2016-12-01 | Discharge: 2016-12-01 | Disposition: A | Discharge status: Home / Self Care | Payer: Medicare HMO | Source: Ambulatory Visit | Attending: Family Medicine | Admitting: Family Medicine

## 2016-12-01 DIAGNOSIS — J111 Influenza due to unidentified influenza virus with other respiratory manifestations: Secondary | ICD-10-CM | POA: Diagnosis present

## 2016-12-01 LAB — RAPID INFLUENZA A&B ANTIGENS (ARMC ONLY)
INFLUENZA A (ARMC): NEGATIVE
INFLUENZA B (ARMC): NEGATIVE

## 2016-12-17 ENCOUNTER — Encounter (INDEPENDENT_AMBULATORY_CARE_PROVIDER_SITE_OTHER): Payer: Medicare HMO | Admitting: Ophthalmology

## 2017-01-18 NOTE — Progress Notes (Deleted)
Cardiology Office Note  Date:  01/18/2017   ID:  [name] [name], DOB 1925/05/25, MRN [id number]  PCP:  Dorothey Baseman, MD   No chief complaint on file.   HPI:  [name] [name] is a very pleasant 83 year old female with past medical history of coronary artery disease, bypass surgery, hypertension, diabetes, chronic lower extremity edema, Pulmonary hypertension (moderate), hyperlipidemia, peripheral vascular disease. She presents for routine follow-up of her coronary artery disease.  In follow-up today, she presents in a wheelchair. She is living in a nursing home, [facility]. She reports that she does not walk even with a walker, unable to transfer from wheelchair to bed She has chronic lower extremity edema on the right, no leg edema on the left. She does not wear compression hose. Denies any chest pain or shortness of breath. She takes Lasix daily, torsemide only when necessary for weight gain above 162 pounds No weights provided to [place] today apart from most recent weight May 29, 160 pounds Weight in her office was 166 pounds on her last clinic visit  takes Lasix 10 mg daily.   EKG on today's visit shows normal sinus rhythm with rate 71 bpm,  T-wave abnormality anterolateral leads  no change from prior EKG   Other past medical history Notes from her nursing home show she is receiving torsemide Monday Wednesday Friday for weight more than 162 pounds.  hospitalization in August 2015.  She had embolization of AV fistula of tibial vessels done at [facility] on 08/18/2015with significant procedure blood loss, hemoglobin into the 5 range. She was not transfused as she was a Jehovah witness. In this setting, she had an MI. Echocardiogram showed essentially normal LV function with inferior wall hypokinesis. Plan was made to manage medically Notes also indicate she had acute respiratory failure, acute kidney injury She denies any significant shortness of breath. She is not walking very much,  presents in a wheelchair.   Echocardiogram 06/28/2014 showing ejection fraction 65-70%, entire inferior wall with hypokinesis, moderately dilated left atrium  Other past medical history presented to [facility] on November 30, 2011 with hypotension and bradycardia. She was found to be in a junctional rhythm with heart rate of 46 beats per minute, systolic blood pressure of 80. Her Cardizem was held with improvement of her heart rate and blood pressure.  Baseline creatinine 1.5, elevated liver enzymes.  Echocardiogram December 01, 2011 shows normal systolic function, ejection fraction greater than 55%, mild LVH, mild TR, moderate right ventricular systolic pressures. CT scan of the carotid arteries shows calcification   PMH:   has a past medical history of Blood clot of neck vein; Chronic diastolic CHF (congestive heart failure) (HCC); Coronary artery disease; Diabetes mellitus; DVT (deep venous thrombosis) (HCC); Hyperlipidemia; Hypertension; Hypothyroidism; Liver cyst; and PVD (peripheral vascular disease) (HCC).  PSH:    Past Surgical History:  Procedure Laterality Date  . ANGIOPLASTY     right leg  . CARDIAC CATHETERIZATION    . CORONARY ARTERY BYPASS GRAFT  1999  . THYROIDECTOMY      Current Outpatient Prescriptions  Medication Sig Dispense Refill  . allopurinol (ZYLOPRIM) 100 MG tablet Take 100 mg by mouth daily.     . Amino Acids-Protein Hydrolys (FEEDING SUPPLEMENT, PRO-STAT SUGAR FREE 64,) LIQD Take 30 mLs by mouth 2 (two) times daily.    Marland Kitchen aspirin EC 81 MG tablet Take 81 mg by mouth daily.     Marland Kitchen atorvastatin (LIPITOR) 40 MG tablet Take 1 tablet (40 mg total) by mouth  daily. 90 tablet 3  . furosemide (LASIX) 20 MG tablet Take 10 mg by mouth daily.    Marland Kitchen. HYDROcodone-acetaminophen (NORCO/VICODIN) 5-325 MG per tablet Take 1 tablet by mouth every 6 (six) hours as needed for moderate pain.    . hydrocortisone (ANUSOL-HC) 2.5 % rectal cream Place 1 application rectally at bedtime.     . insulin aspart (NOVOLOG) 100 UNIT/ML injection Sliding scale    . iron polysaccharides (NIFEREX) 150 MG capsule Take 150 mg by mouth 2 (two) times daily.     . isosorbide mononitrate (IMDUR) 30 MG 24 hr tablet TAKE ONE TABLET BY MOUTH EVERY DAY 90 tablet 3  . LEVEMIR 100 UNIT/ML injection Inject 50 Units into the skin daily.     Marland Kitchen. levothyroxine (SYNTHROID, LEVOTHROID) 100 MCG tablet Take 100 mcg by mouth daily before breakfast.    . losartan (COZAAR) 100 MG tablet Take 100 mg by mouth daily.    . metFORMIN (GLUCOPHAGE-XR) 750 MG 24 hr tablet Take 750 mg by mouth daily with breakfast.    . metoprolol (LOPRESSOR) 50 MG tablet Take 50 mg by mouth 2 (two) times daily.    . nitroGLYCERIN (NITROSTAT) 0.4 MG SL tablet Place 0.4 mg under the tongue every 5 (five) minutes as needed for chest pain.    [name]. ONETOUCH DELICA LANCETS 33G MISC daily.     [name]. ONETOUCH VERIO test strip daily.     . polyethylene glycol (MIRALAX / GLYCOLAX) packet Take 17 g by mouth daily.    . potassium chloride (K-DUR) 10 MEQ tablet Take one tablet as directed when taking Torsemide.    . torsemide (DEMADEX) 20 MG tablet Take one tablet on Monday, Wednesday and Friday if weight >162 pounds     No current facility-administered medications for this visit.      Allergies:   Acetaminophen and Codeine   Social History:  The patient  reports that she quit smoking about 18 years ago. Her smoking use included Cigarettes. She smoked 1.00 pack per day. She does not have any smokeless tobacco history on file. She reports that she does not drink alcohol or use drugs.   Family History:   Family history is unknown by patient.    Review of Systems: ROS   PHYSICAL EXAM: VS:  There were no vitals taken for this visit. , BMI There is no height or weight on file to calculate BMI. GEN: Well nourished, well developed, in no acute distress HEENT: normal Neck: no JVD, carotid bruits, or masses Cardiac: RRR; no murmurs, rubs, or gallops,no  edema  Respiratory:  clear to auscultation bilaterally, normal work of breathing GI: soft, nontender, nondistended, + BS MS: no deformity or atrophy Skin: warm and dry, no rash Neuro:  Strength and sensation are intact Psych: euthymic mood, full affect    Recent Labs: 04/11/2016: ALT 9    Lipid Panel Lab Results  Component Value Date   CHOL 114 04/11/2016   HDL 36 (L) 04/11/2016   LDLCALC 52 04/11/2016   TRIG 130 04/11/2016      Wt Readings from Last 3 Encounters:  04/11/16 160 lb 2 oz (72.6 kg)  10/12/15 166 lb (75.3 kg)  12/15/14 161 lb 12 oz (73.4 kg)       ASSESSMENT AND PLAN:  No diagnosis found.   Disposition:   F/U  6 months  No orders of the defined types were placed in this encounter.    Lazarus SalinesSigned, Tim Syretta Kochel, M.D., Ph.D. 01/18/2017  [place]  [place], [place]

## 2017-01-19 ENCOUNTER — Ambulatory Visit: Payer: Medicare HMO | Admitting: Cardiovascular Disease

## 2017-01-26 ENCOUNTER — Ambulatory Visit: Payer: Medicare HMO | Admitting: Cardiovascular Disease

## 2017-02-01 NOTE — Progress Notes (Signed)
Cardiology Office Note  Date:  02/03/2017   ID:  [name] [name], DOB [birth year], MRN [id number]  PCP:  Dorothey Baseman, MD   Chief Complaint  Patient presents with  . OTHER    6 month f/u no complaints today. Meds reviewed verbally with pt.    HPI:  [name] [name] is a very pleasant 83 year old female with past medical history of coronary artery disease,  bypass surgery/CABG  hypertension,  diabetes,  chronic lower extremity edema,  Pulmonary hypertension (moderate),  hyperlipidemia,  peripheral vascular disease.  She presents for routine follow-up of her coronary artery disease.  In follow-up today, she presents in a wheelchair. She is living in a nursing home, [facility]. As on her previous clinic visit, she does not walk even with a walker,  unable to transfer from wheelchair to bed  chronic trace lower extremity edema on the right, no leg edema on the left.  No significant change in leg swelling he does not wear compression hose.  Denies any chest pain or shortness of breath on todays visit She takes Lasix daily, Appears to take 10 mg daily torsemide only when necessary for weight gain above 162 pounds  Weight in her office was 166 pounds, same as  on her last clinic visit  LABS: total chol 114, LDL 52  EKG on today's visit shows normal sinus rhythm with rate 71 bpm,  T wave abnormality in 1 and aVL  Other past medical history Notes from her nursing home show she is receiving torsemide Monday Wednesday Friday for weight more than 162 pounds.  hospitalization in August 2015.  She had embolization of AV fistula of tibial vessels done at [facility] on 08/18/2015with significant procedure blood loss, hemoglobin into the 5 range. She was not transfused as she was a Jehovah witness. In this setting, she had an MI. Echocardiogram showed essentially normal LV function with inferior wall hypokinesis. Plan was made to manage medically Notes also indicate she had acute  respiratory failure, acute kidney injury She denies any significant shortness of breath. She is not walking very much, presents in a wheelchair.   Echocardiogram 06/28/2014 showing ejection fraction 65-70%, entire inferior wall with hypokinesis, moderately dilated left atrium  presented to [facility] on November 30, 2011 with hypotension and bradycardia. She was found to be in a junctional rhythm with heart rate of 46 beats per minute, systolic blood pressure of 80. Her Cardizem was held with improvement of her heart rate and blood pressure.  Baseline creatinine 1.5, elevated liver enzymes.  Echocardiogram December 01, 2011 shows normal systolic function, ejection fraction greater than 55%, mild LVH, mild TR, moderate right ventricular systolic pressures. CT scan of the carotid arteries shows calcification  PMH:   has a past medical history of Blood clot of neck vein; Chronic diastolic CHF (congestive heart failure) (HCC); Coronary artery disease; Diabetes mellitus; DVT (deep venous thrombosis) (HCC); Hyperlipidemia; Hypertension; Hypothyroidism; Liver cyst; and PVD (peripheral vascular disease) (HCC).  PSH:    Past Surgical History:  Procedure Laterality Date  . ANGIOPLASTY     right leg  . CARDIAC CATHETERIZATION    . CORONARY ARTERY BYPASS GRAFT  1999  . THYROIDECTOMY      Current Outpatient Prescriptions  Medication Sig Dispense Refill  . allopurinol (ZYLOPRIM) 100 MG tablet Take 100 mg by mouth daily.     . Amino Acids-Protein Hydrolys (FEEDING SUPPLEMENT, PRO-STAT SUGAR FREE 64,) LIQD Take 30 mLs by mouth 2 (two) times daily.    Marland Kitchen aspirin  EC 81 MG tablet Take 81 mg by mouth daily.     Marland Kitchen. atorvastatin (LIPITOR) 40 MG tablet Take 1 tablet (40 mg total) by mouth daily. 90 tablet 3  . furosemide (LASIX) 20 MG tablet Take 10 mg by mouth daily.    Marland Kitchen. gabapentin (NEURONTIN) 300 MG capsule Take 300 mg by mouth 3 (three) times daily.    Marland Kitchen. HYDROcodone-acetaminophen (NORCO/VICODIN) 5-325 MG  per tablet Take 1 tablet by mouth every 6 (six) hours as needed for moderate pain.    . hydrocortisone (ANUSOL-HC) 2.5 % rectal cream Place 1 application rectally at bedtime.    . insulin aspart (NOVOLOG) 100 UNIT/ML injection Sliding scale    . iron polysaccharides (NIFEREX) 150 MG capsule Take 150 mg by mouth daily.     . isosorbide mononitrate (IMDUR) 30 MG 24 hr tablet TAKE ONE TABLET BY MOUTH EVERY DAY 90 tablet 3  . LEVEMIR 100 UNIT/ML injection Inject 62 Units into the skin daily.     Marland Kitchen. levothyroxine (SYNTHROID, LEVOTHROID) 100 MCG tablet Take 100 mcg by mouth daily before breakfast.    . losartan (COZAAR) 100 MG tablet Take 100 mg by mouth daily.    . metFORMIN (GLUCOPHAGE-XR) 750 MG 24 hr tablet Take 750 mg by mouth daily with breakfast.    . metoprolol (LOPRESSOR) 50 MG tablet Take 50 mg by mouth 2 (two) times daily.    . nitroGLYCERIN (NITROSTAT) 0.4 MG SL tablet Place 0.4 mg under the tongue every 5 (five) minutes as needed for chest pain.    [name]. ONETOUCH DELICA LANCETS 33G MISC daily.     [name]. ONETOUCH VERIO test strip daily.     . polyethylene glycol (MIRALAX / GLYCOLAX) packet Take 17 g by mouth daily.    . potassium chloride (K-DUR) 10 MEQ tablet Take one tablet as directed when taking Torsemide.    . senna (SENOKOT) 8.6 MG TABS tablet Take 1 tablet by mouth daily.    Marland Kitchen. torsemide (DEMADEX) 20 MG tablet Take one tablet on Monday, Wednesday and Friday if weight >162 pounds     No current facility-administered medications for this visit.      Allergies:   Acetaminophen and Codeine   Social History:  The patient  reports that she quit smoking about 18 years ago. Her smoking use included Cigarettes. She smoked 1.00 pack per day. She does not have any smokeless tobacco history on file. She reports that she does not drink alcohol or use drugs.   Family History:   Family history is unknown by patient.    Review of Systems: Review of Systems  Constitutional: Negative.   Respiratory:  Negative.   Cardiovascular: Positive for leg swelling.  Gastrointestinal: Negative.   Musculoskeletal: Negative.        Leg weakness  Neurological: Negative.   Psychiatric/Behavioral: Negative.   All other systems reviewed and are negative.    PHYSICAL EXAM: VS:  BP 130/70 (BP Location: Right Arm, Patient Position: Sitting, Cuff Size: Normal)   Pulse 71   Ht 5\' 3"  (1.6 m)   Wt 166 lb (75.3 kg)   BMI 29.41 kg/m  , BMI Body mass index is 29.41 kg/m. GEN: Well nourished, well developed, in no acute distress , Presenting a wheelchair HEENT: normal  Neck: no JVD, carotid bruits, or masses Cardiac: RRR; no murmurs, rubs, or gallops,Trace pitting edema Around the ankle right, minimal on the left Respiratory:  clear to auscultation bilaterally, normal work of breathing GI: soft, nontender, nondistended, +  BS MS: no deformity or atrophy  Skin: warm and dry, no rash Neuro:  Strength and sensation are intact Psych: euthymic mood, full affect    Recent Labs: 04/11/2016: ALT 9    Lipid Panel Lab Results  Component Value Date   CHOL 114 04/11/2016   HDL 36 (L) 04/11/2016   LDLCALC 52 04/11/2016   TRIG 130 04/11/2016      Wt Readings from Last 3 Encounters:  02/03/17 166 lb (75.3 kg)  04/11/16 160 lb 2 oz (72.6 kg)  10/12/15 166 lb (75.3 kg)       ASSESSMENT AND PLAN:  Mixed hyperlipidemia - Plan: EKG 12-Lead Cholesterol is at goal on the current lipid regimen. No changes to the medications were made.  Essential hypertension - Plan: EKG 12-Lead Blood pressure is well controlled on today's visit. No changes made to the medications.  Chronic diastolic CHF (congestive heart failure) (HCC) - Plan: EKG 12-Lead Appears relatively euvolemic, weight is unchanged No changes to her medication regiment  Coronary artery disease of native artery of native heart with stable angina pectoris (HCC) - Plan: EKG 12-Lead Currently with no symptoms of angina. No further workup at this  time. Continue current medication regimen.  SOB (shortness of breath) - Plan: EKG 12-Lead Not very active at baseline, denies shortness of breath Wheelchair bound essentially  S/P CABG (coronMAary artery bypass graft) - Plan: EKG 12-Lead No change in EKG compared to previous visits No symptoms concerning for ischemia or angina  Localized edema - Plan: EKG 12-Lead Unchanged, slightly worse on the right than the left, stable  Type 2 diabetes mellitus with complication, without long-term current use of insulin (HCC) - Plan: EKG 12-Lead We have encouraged continued exercise, careful diet management in an effort to lose weight.   Total encounter time more than 25 minutes  Greater than 50% was spent in counseling and coordination of care with the patient  him for now but no here Disposition:   F/U  6 months   Orders Placed This Encounter  Procedures  . EKG 12-Lead     Signed, Dossie Arbour, M.D., Ph.D. 02/03/2017  [facility] [place], [place] [phone]

## 2017-02-03 ENCOUNTER — Encounter: Payer: Self-pay | Admitting: Cardiovascular Disease

## 2017-02-03 ENCOUNTER — Ambulatory Visit (INDEPENDENT_AMBULATORY_CARE_PROVIDER_SITE_OTHER): Payer: Medicare HMO | Admitting: Cardiovascular Disease

## 2017-02-03 VITALS — BP 130/70 | HR 71 | Ht 63.0 in | Wt 166.0 lb

## 2017-02-03 DIAGNOSIS — I5032 Chronic diastolic (congestive) heart failure: Secondary | ICD-10-CM

## 2017-02-03 DIAGNOSIS — I25118 Atherosclerotic heart disease of native coronary artery with other forms of angina pectoris: Secondary | ICD-10-CM

## 2017-02-03 DIAGNOSIS — I739 Peripheral vascular disease, unspecified: Secondary | ICD-10-CM | POA: Diagnosis not present

## 2017-02-03 DIAGNOSIS — E118 Type 2 diabetes mellitus with unspecified complications: Secondary | ICD-10-CM | POA: Diagnosis not present

## 2017-02-03 DIAGNOSIS — R600 Localized edema: Secondary | ICD-10-CM

## 2017-02-03 DIAGNOSIS — R0602 Shortness of breath: Secondary | ICD-10-CM

## 2017-02-03 DIAGNOSIS — Z951 Presence of aortocoronary bypass graft: Secondary | ICD-10-CM

## 2017-02-03 DIAGNOSIS — E782 Mixed hyperlipidemia: Secondary | ICD-10-CM | POA: Diagnosis not present

## 2017-02-03 DIAGNOSIS — I10 Essential (primary) hypertension: Secondary | ICD-10-CM | POA: Diagnosis not present

## 2017-02-03 NOTE — Patient Instructions (Signed)
No major issues to address today Everything appears stable including weight me physical exam, EKG We will try to obtain lab work from [facility] for our records  Medication Instructions:   No medication changes made  Labwork:  No new labs needed  Testing/Procedures:  No further testing at this time   I recommend watching educational videos on topics of interest to you at:       [email or web address]  Enter code: [email or web address]    Follow-Up: It was a pleasure seeing you in the office today. Please call us if you have new issues that need to be addressed before your next appt.  [phone]  Your physician wants you to follow-up in: 6 months.  You will receive a reminder letter in the mail two months in advance. If you don't receive a letter, please call our office to schedule the follow-up appointment.  If you need a refill on your cardiac medications before your next appointment, please call your pharmacy.

## 2017-11-20 ENCOUNTER — Other Ambulatory Visit
Admission: RE | Admit: 2017-11-20 | Discharge: 2017-11-20 | Disposition: A | Discharge status: Home / Self Care | Payer: Medicare HMO | Source: Other Acute Inpatient Hospital | Attending: Family Medicine | Admitting: Family Medicine

## 2017-11-20 DIAGNOSIS — I2510 Atherosclerotic heart disease of native coronary artery without angina pectoris: Secondary | ICD-10-CM | POA: Diagnosis present

## 2017-11-20 DIAGNOSIS — I10 Essential (primary) hypertension: Secondary | ICD-10-CM | POA: Insufficient documentation

## 2017-11-20 LAB — BRAIN NATRIURETIC PEPTIDE: B Natriuretic Peptide: 99 pg/mL (ref 0.0–100.0)

## 2018-09-09 ENCOUNTER — Emergency Department: Payer: Medicare HMO

## 2018-09-09 ENCOUNTER — Other Ambulatory Visit: Payer: Self-pay

## 2018-09-09 ENCOUNTER — Inpatient Hospital Stay
Admission: EM | Admit: 2018-09-09 | Discharge: 2018-09-15 | DRG: 194 | Disposition: A | Discharge status: Skilled Nursing Facility | Payer: Medicare HMO | Source: Skilled Nursing Facility | Attending: Internal Medicine | Admitting: Internal Medicine

## 2018-09-09 DIAGNOSIS — Z7984 Long term (current) use of oral hypoglycemic drugs: Secondary | ICD-10-CM

## 2018-09-09 DIAGNOSIS — Z886 Allergy status to analgesic agent status: Secondary | ICD-10-CM

## 2018-09-09 DIAGNOSIS — J189 Pneumonia, unspecified organism: Secondary | ICD-10-CM | POA: Diagnosis not present

## 2018-09-09 DIAGNOSIS — Z955 Presence of coronary angioplasty implant and graft: Secondary | ICD-10-CM

## 2018-09-09 DIAGNOSIS — Z951 Presence of aortocoronary bypass graft: Secondary | ICD-10-CM

## 2018-09-09 DIAGNOSIS — N139 Obstructive and reflux uropathy, unspecified: Secondary | ICD-10-CM | POA: Diagnosis present

## 2018-09-09 DIAGNOSIS — I5032 Chronic diastolic (congestive) heart failure: Secondary | ICD-10-CM | POA: Diagnosis present

## 2018-09-09 DIAGNOSIS — Z7901 Long term (current) use of anticoagulants: Secondary | ICD-10-CM

## 2018-09-09 DIAGNOSIS — Z885 Allergy status to narcotic agent status: Secondary | ICD-10-CM

## 2018-09-09 DIAGNOSIS — N189 Chronic kidney disease, unspecified: Secondary | ICD-10-CM

## 2018-09-09 DIAGNOSIS — E1121 Type 2 diabetes mellitus with diabetic nephropathy: Secondary | ICD-10-CM | POA: Diagnosis present

## 2018-09-09 DIAGNOSIS — R410 Disorientation, unspecified: Secondary | ICD-10-CM | POA: Diagnosis present

## 2018-09-09 DIAGNOSIS — E1151 Type 2 diabetes mellitus with diabetic peripheral angiopathy without gangrene: Secondary | ICD-10-CM | POA: Diagnosis present

## 2018-09-09 DIAGNOSIS — E11649 Type 2 diabetes mellitus with hypoglycemia without coma: Secondary | ICD-10-CM | POA: Diagnosis present

## 2018-09-09 DIAGNOSIS — I130 Hypertensive heart and chronic kidney disease with heart failure and stage 1 through stage 4 chronic kidney disease, or unspecified chronic kidney disease: Secondary | ICD-10-CM | POA: Diagnosis present

## 2018-09-09 DIAGNOSIS — Z794 Long term (current) use of insulin: Secondary | ICD-10-CM

## 2018-09-09 DIAGNOSIS — N184 Chronic kidney disease, stage 4 (severe): Secondary | ICD-10-CM | POA: Diagnosis present

## 2018-09-09 DIAGNOSIS — Z86718 Personal history of other venous thrombosis and embolism: Secondary | ICD-10-CM

## 2018-09-09 DIAGNOSIS — N179 Acute kidney failure, unspecified: Secondary | ICD-10-CM | POA: Diagnosis present

## 2018-09-09 DIAGNOSIS — E162 Hypoglycemia, unspecified: Secondary | ICD-10-CM | POA: Diagnosis not present

## 2018-09-09 DIAGNOSIS — I2510 Atherosclerotic heart disease of native coronary artery without angina pectoris: Secondary | ICD-10-CM | POA: Diagnosis present

## 2018-09-09 DIAGNOSIS — Z7982 Long term (current) use of aspirin: Secondary | ICD-10-CM

## 2018-09-09 DIAGNOSIS — Z9862 Peripheral vascular angioplasty status: Secondary | ICD-10-CM

## 2018-09-09 DIAGNOSIS — E118 Type 2 diabetes mellitus with unspecified complications: Secondary | ICD-10-CM | POA: Diagnosis present

## 2018-09-09 DIAGNOSIS — E785 Hyperlipidemia, unspecified: Secondary | ICD-10-CM | POA: Diagnosis present

## 2018-09-09 DIAGNOSIS — R0902 Hypoxemia: Secondary | ICD-10-CM

## 2018-09-09 DIAGNOSIS — Z66 Do not resuscitate: Secondary | ICD-10-CM | POA: Diagnosis present

## 2018-09-09 DIAGNOSIS — I10 Essential (primary) hypertension: Secondary | ICD-10-CM | POA: Diagnosis present

## 2018-09-09 DIAGNOSIS — Z79899 Other long term (current) drug therapy: Secondary | ICD-10-CM

## 2018-09-09 DIAGNOSIS — Z87891 Personal history of nicotine dependence: Secondary | ICD-10-CM

## 2018-09-09 DIAGNOSIS — E039 Hypothyroidism, unspecified: Secondary | ICD-10-CM | POA: Diagnosis present

## 2018-09-09 DIAGNOSIS — E1122 Type 2 diabetes mellitus with diabetic chronic kidney disease: Secondary | ICD-10-CM | POA: Diagnosis present

## 2018-09-09 DIAGNOSIS — Y95 Nosocomial condition: Secondary | ICD-10-CM | POA: Diagnosis present

## 2018-09-09 LAB — CBC WITH DIFFERENTIAL/PLATELET
Abs Immature Granulocytes: 0.08 10*3/uL — ABNORMAL HIGH (ref 0.00–0.07)
BASOS ABS: 0 10*3/uL (ref 0.0–0.1)
Basophils Relative: 0 %
EOS ABS: 0.1 10*3/uL (ref 0.0–0.5)
EOS PCT: 0 %
HEMATOCRIT: 36 % (ref 36.0–46.0)
HEMOGLOBIN: 11.3 g/dL — AB (ref 12.0–15.0)
Immature Granulocytes: 1 %
LYMPHS ABS: 1.5 10*3/uL (ref 0.7–4.0)
Lymphocytes Relative: 10 %
MCH: 30.4 pg (ref 26.0–34.0)
MCHC: 31.4 g/dL (ref 30.0–36.0)
MCV: 96.8 fL (ref 80.0–100.0)
MONOS PCT: 4 %
Monocytes Absolute: 0.5 10*3/uL (ref 0.1–1.0)
NRBC: 0 % (ref 0.0–0.2)
Neutro Abs: 12.4 10*3/uL — ABNORMAL HIGH (ref 1.7–7.7)
Neutrophils Relative %: 85 %
Platelets: 221 10*3/uL (ref 150–400)
RBC: 3.72 MIL/uL — ABNORMAL LOW (ref 3.87–5.11)
RDW: 13.2 % (ref 11.5–15.5)
WBC: 14.6 10*3/uL — ABNORMAL HIGH (ref 4.0–10.5)

## 2018-09-09 LAB — BASIC METABOLIC PANEL
ANION GAP: 16 — AB (ref 5–15)
BUN: 54 mg/dL — ABNORMAL HIGH (ref 8–23)
CHLORIDE: 102 mmol/L (ref 98–111)
CO2: 23 mmol/L (ref 22–32)
Calcium: 8.9 mg/dL (ref 8.9–10.3)
Creatinine, Ser: 3.76 mg/dL — ABNORMAL HIGH (ref 0.44–1.00)
GFR calc non Af Amer: 10 mL/min — ABNORMAL LOW (ref >60)
GFR, EST AFRICAN AMERICAN: 11 mL/min — AB (ref >60)
Glucose, Bld: 91 mg/dL (ref 70–99)
Potassium: 4.9 mmol/L (ref 3.5–5.1)
Sodium: 141 mmol/L (ref 135–145)

## 2018-09-09 LAB — GLUCOSE, CAPILLARY: Glucose-Capillary: 63 mg/dL — ABNORMAL LOW (ref 70–99)

## 2018-09-09 MED ORDER — VANCOMYCIN HCL IN DEXTROSE 1-5 GM/200ML-% IV SOLN
1000.0000 mg | Freq: Once | INTRAVENOUS | Status: AC
  Administered 2018-09-10: 1000 mg via INTRAVENOUS
  Filled 2018-09-09: qty 200

## 2018-09-09 MED ORDER — SODIUM CHLORIDE 0.9 % IV SOLN
2.0000 g | Freq: Once | INTRAVENOUS | Status: AC
  Administered 2018-09-10: 2 g via INTRAVENOUS
  Filled 2018-09-09: qty 2

## 2018-09-09 NOTE — ED Notes (Addendum)
Pt attempted to be taken off of [place] due to not having any oxygen prior to coming to the ED. Pt O2 sats dropped high 80s. Pt put back on 3L per MD.

## 2018-09-09 NOTE — ED Triage Notes (Addendum)
Pt arrived via [facility] from [facility] with c/o Hypoglycemia and SHOB. EMS states [facility] stated that she was getting [facility] and when they checked her sugar it was 60. EMS states pt was given Pedialyte and EMS BS was 114. BS on arrival is 70. No resp distress noted.  Pt given OJ on arrival.

## 2018-09-09 NOTE — ED Provider Notes (Signed)
[facility] Emergency Department Provider Note  ____________________________________________   I have reviewed the triage vital signs and the nursing notes.   HISTORY  Chief Complaint Difficulty with bowels  History limited by: Not Limited   HPI [name] [name] is a 84 y.o. female who presents to the emergency department today from [facility] because of apparent concern by staff for shortness of breath and hypoglycemia.  Patient however his main concern was for some difficulty with her bowels over the past few days.  Patient states she has not felt particularly short of breath recently although does state that she has had some cough.  This has been productive of phlegm.  She denies any chest pain.  Denies any fevers.   Per medical record review patient has a history of DVT, HLD, HTN.   Past Medical History:  Diagnosis Date  . Blood clot of neck vein   . Chronic diastolic CHF (congestive heart failure) (HCC)    a. December 01, 2011 shows normal systolic function, ejection fraction greater than 55%, mild LVH, mild TR, moderate right ventricular systolic pressures.  . Coronary artery disease    a. s/p CABG in [place] - late 90's/early 2000's;  b. s/p stenting  . Diabetes mellitus   . DVT (deep venous thrombosis) (HCC)    a. RLE - chronic xarelto.  . Hyperlipidemia   . Hypertension   . Hypothyroidism   . Liver cyst   . PVD (peripheral vascular disease) [facility])     Patient Active Problem List   Diagnosis Date Noted  . Hypertensive heart disease 05/15/2014  . Candidal dermatitis 05/15/2014  . Generalized weakness 05/15/2014  . Pressure ulcer, stage 2 (HCC) 05/15/2014  . Neuropathic pain 05/15/2014  . Absence of bladder continence 05/15/2014  . Diabetes mellitus type 2 with complications (HCC) 05/14/2014  . Unspecified hypothyroidism 05/14/2014  . Gout, chronic 05/14/2014  . Constipation 05/14/2014  . Coronary artery disease   . Chronic diastolic CHF  (congestive heart failure) (HCC) 03/04/2013  . Essential hypertension 12/13/2012  . Tachycardia 12/11/2011  . SOB (shortness of breath) 03/12/2011  . S/P CABG (coronary artery bypass graft) 03/12/2011  . PVD (peripheral vascular disease) (HCC) 03/12/2011  . Hyperlipemia 03/12/2011  . Edema 03/12/2011    Past Surgical History:  Procedure Laterality Date  . ANGIOPLASTY     right leg  . CARDIAC CATHETERIZATION    . CORONARY ARTERY BYPASS GRAFT  1999  . THYROIDECTOMY      Prior to Admission medications   Medication Sig Start Date End Date Taking? Authorizing Provider  allopurinol (ZYLOPRIM) 100 MG tablet Take 100 mg by mouth daily.     [provider]  Amino Acids-Protein Hydrolys (FEEDING SUPPLEMENT, PRO-STAT SUGAR FREE 64,) LIQD Take 30 mLs by mouth 2 (two) times daily.    [provider]  aspirin EC 81 MG tablet Take 81 mg by mouth daily.     [provider]  atorvastatin (LIPITOR) 40 MG tablet Take 1 tablet (40 mg total) by mouth daily. 10/15/15   Antonieta Iba, MD  furosemide (LASIX) 20 MG tablet Take 10 mg by mouth daily.    [provider]  gabapentin (NEURONTIN) 300 MG capsule Take 300 mg by mouth 3 (three) times daily.    [provider]  HYDROcodone-acetaminophen (NORCO/VICODIN) 5-325 MG per tablet Take 1 tablet by mouth every 6 (six) hours as needed for moderate pain.    [provider]  hydrocortisone (ANUSOL-HC) 2.5 %  rectal cream Place 1 application rectally at bedtime.    [provider]  insulin aspart (NOVOLOG) 100 UNIT/ML injection Sliding scale    [provider]  iron polysaccharides (NIFEREX) 150 MG capsule Take 150 mg by mouth daily.     [provider]  isosorbide mononitrate (IMDUR) 30 MG 24 hr tablet TAKE ONE TABLET BY MOUTH EVERY DAY 01/02/14   Antonieta Iba, MD  LEVEMIR 100 UNIT/ML injection Inject 62 Units into the skin daily.  02/21/13   [provider]   levothyroxine (SYNTHROID, LEVOTHROID) 100 MCG tablet Take 100 mcg by mouth daily before breakfast.    [provider]  losartan (COZAAR) 100 MG tablet Take 100 mg by mouth daily.    [provider]  metFORMIN (GLUCOPHAGE-XR) 750 MG 24 hr tablet Take 750 mg by mouth daily with breakfast.    [provider]  metoprolol (LOPRESSOR) 50 MG tablet Take 50 mg by mouth 2 (two) times daily.    [provider]  nitroGLYCERIN (NITROSTAT) 0.4 MG SL tablet Place 0.4 mg under the tongue every 5 (five) minutes as needed for chest pain.    [provider]  [facility] DELICA LANCETS 33G MISC daily.  12/24/12   [provider]  [facility] VERIO test strip daily.  02/14/13   [provider]  polyethylene glycol (MIRALAX / GLYCOLAX) packet Take 17 g by mouth daily.    [provider]  potassium chloride (K-DUR) 10 MEQ tablet Take one tablet as directed when taking Torsemide.    [provider]  senna (SENOKOT) 8.6 MG TABS tablet Take 1 tablet by mouth daily.    [provider]  torsemide (DEMADEX) 20 MG tablet Take one tablet on Monday, Wednesday and Friday if weight >162 pounds    [provider]    Allergies Acetaminophen and Codeine  Family History  Family history unknown: Yes    Social History Social History   Tobacco Use  . Smoking status: Former Smoker    Packs/day: 1.00    Types: Cigarettes    Last attempt to quit: 03/11/1998    Years since quitting: 20.5  Substance Use Topics  . Alcohol use: No  . Drug use: No    Review of Systems Constitutional: No fever/chills Eyes: No visual changes. ENT: No sore throat. Cardiovascular: Denies chest pain. Respiratory: Positive for cough. Gastrointestinal: No abdominal pain.  No nausea, no vomiting.  No diarrhea.   Genitourinary: Negative for dysuria. Musculoskeletal: Negative for back pain. Skin: Negative for rash. Neurological: Negative for headaches, focal  weakness or numbness.  ____________________________________________   PHYSICAL EXAM:  VITAL SIGNS: ED Triage Vitals  Enc Vitals Group     BP 09/09/18 2144 132/64     Pulse Rate 09/09/18 2144 75     Resp 09/09/18 2144 20     Temp 09/09/18 2144 98.3 F (36.8 C)     Temp Source 09/09/18 2144 Oral     SpO2 09/09/18 2144 94 %     Weight 09/09/18 2145 165 lb 5.5 oz (75 kg)     Height 09/09/18 2145 5\' 2"  (1.575 m)     Head Circumference --      [facility] --      Pain Score 09/09/18 2145 0   Constitutional: Alert and oriented.  Eyes: Conjunctivae are normal.  ENT      Head: Normocephalic and atraumatic.      Nose: No congestion/rhinnorhea.      Mouth/Throat: Mucous  membranes are moist.      Neck: No stridor. Hematological/Lymphatic/Immunilogical: No cervical lymphadenopathy. Cardiovascular: Normal rate, regular rhythm.  No murmurs, rubs, or gallops.  Respiratory: Normal respiratory effort without tachypnea nor retractions. Breath sounds are clear and equal bilaterally. No wheezes/rales/rhonchi. Gastrointestinal: Soft and non tender. No rebound. No guarding.  Genitourinary: Deferred Musculoskeletal: Normal range of motion in all extremities. No lower extremity edema. Neurologic:  Normal speech and language. No gross focal neurologic deficits are appreciated.  Skin:  Skin is warm, dry and intact. No rash noted. Psychiatric: Mood and affect are normal. Speech and behavior are normal. Patient exhibits appropriate insight and judgment.  ____________________________________________    LABS (pertinent positives/negatives)  CBC wbc 14.6, hgb 11.3, plt 221 BMP na 141, k 4.9, glu 91, cr 3.76  ____________________________________________   EKG  I, Phineas Semen, attending physician, personally viewed and interpreted this EKG  EKG Time: 2141 Rate: 73 Rhythm: sinus rhythm Axis: normal Intervals: qtc 440 QRS: narrow, q waves v1 ST changes: no st elevation Impression:  abnormal ekg   ____________________________________________    RADIOLOGY  CXR Bilateral effusion, question possible pneumonia  ____________________________________________   PROCEDURES  Procedures  ____________________________________________   INITIAL IMPRESSION / ASSESSMENT AND PLAN / ED COURSE  Pertinent labs & imaging results that were available during my care of the patient were reviewed by me and considered in my medical decision making (see chart for details).   Patient presented to the emergency department today because of concerns for shortness of breath by staff at [facility].  Patient denies any shortness of breath but does admit to a cough.  She was also found to be hypoglycemic. X-ray is concerning for possible pneumonia and patient does have wbc 14.6, will start abx and plan on admission. Discussed findings and plan with patient and family.  ____________________________________________   FINAL CLINICAL IMPRESSION(S) / ED DIAGNOSES  Final diagnoses:  Hypoxia  HCAP (healthcare-associated pneumonia)  Hypoglycemia     Note: This dictation was prepared with Dragon dictation. Any transcriptional errors that result from this process are unintentional     Phineas Semen, MD 09/10/18 [phone]

## 2018-09-09 NOTE — ED Notes (Signed)
Pt back from x-ray.

## 2018-09-10 ENCOUNTER — Other Ambulatory Visit: Payer: Self-pay

## 2018-09-10 DIAGNOSIS — Z955 Presence of coronary angioplasty implant and graft: Secondary | ICD-10-CM | POA: Diagnosis not present

## 2018-09-10 DIAGNOSIS — Y95 Nosocomial condition: Secondary | ICD-10-CM | POA: Diagnosis present

## 2018-09-10 DIAGNOSIS — Z66 Do not resuscitate: Secondary | ICD-10-CM | POA: Diagnosis present

## 2018-09-10 DIAGNOSIS — E785 Hyperlipidemia, unspecified: Secondary | ICD-10-CM | POA: Diagnosis present

## 2018-09-10 DIAGNOSIS — Z951 Presence of aortocoronary bypass graft: Secondary | ICD-10-CM | POA: Diagnosis not present

## 2018-09-10 DIAGNOSIS — J189 Pneumonia, unspecified organism: Secondary | ICD-10-CM | POA: Diagnosis present

## 2018-09-10 DIAGNOSIS — Z794 Long term (current) use of insulin: Secondary | ICD-10-CM | POA: Diagnosis not present

## 2018-09-10 DIAGNOSIS — E162 Hypoglycemia, unspecified: Secondary | ICD-10-CM | POA: Diagnosis present

## 2018-09-10 DIAGNOSIS — Z885 Allergy status to narcotic agent status: Secondary | ICD-10-CM | POA: Diagnosis not present

## 2018-09-10 DIAGNOSIS — Z79899 Other long term (current) drug therapy: Secondary | ICD-10-CM | POA: Diagnosis not present

## 2018-09-10 DIAGNOSIS — Z7982 Long term (current) use of aspirin: Secondary | ICD-10-CM | POA: Diagnosis not present

## 2018-09-10 DIAGNOSIS — N179 Acute kidney failure, unspecified: Secondary | ICD-10-CM | POA: Diagnosis present

## 2018-09-10 DIAGNOSIS — I130 Hypertensive heart and chronic kidney disease with heart failure and stage 1 through stage 4 chronic kidney disease, or unspecified chronic kidney disease: Secondary | ICD-10-CM | POA: Diagnosis present

## 2018-09-10 DIAGNOSIS — E11649 Type 2 diabetes mellitus with hypoglycemia without coma: Secondary | ICD-10-CM | POA: Diagnosis present

## 2018-09-10 DIAGNOSIS — N189 Chronic kidney disease, unspecified: Secondary | ICD-10-CM | POA: Diagnosis present

## 2018-09-10 DIAGNOSIS — Z9862 Peripheral vascular angioplasty status: Secondary | ICD-10-CM | POA: Diagnosis not present

## 2018-09-10 DIAGNOSIS — Z886 Allergy status to analgesic agent status: Secondary | ICD-10-CM | POA: Diagnosis not present

## 2018-09-10 DIAGNOSIS — Z87891 Personal history of nicotine dependence: Secondary | ICD-10-CM | POA: Diagnosis not present

## 2018-09-10 DIAGNOSIS — R0902 Hypoxemia: Secondary | ICD-10-CM | POA: Diagnosis present

## 2018-09-10 DIAGNOSIS — Z86718 Personal history of other venous thrombosis and embolism: Secondary | ICD-10-CM | POA: Diagnosis not present

## 2018-09-10 DIAGNOSIS — Z7984 Long term (current) use of oral hypoglycemic drugs: Secondary | ICD-10-CM | POA: Diagnosis not present

## 2018-09-10 DIAGNOSIS — Z7901 Long term (current) use of anticoagulants: Secondary | ICD-10-CM | POA: Diagnosis not present

## 2018-09-10 DIAGNOSIS — I5032 Chronic diastolic (congestive) heart failure: Secondary | ICD-10-CM | POA: Diagnosis present

## 2018-09-10 DIAGNOSIS — I2510 Atherosclerotic heart disease of native coronary artery without angina pectoris: Secondary | ICD-10-CM | POA: Diagnosis present

## 2018-09-10 DIAGNOSIS — N184 Chronic kidney disease, stage 4 (severe): Secondary | ICD-10-CM | POA: Diagnosis present

## 2018-09-10 DIAGNOSIS — E1122 Type 2 diabetes mellitus with diabetic chronic kidney disease: Secondary | ICD-10-CM | POA: Diagnosis present

## 2018-09-10 DIAGNOSIS — R410 Disorientation, unspecified: Secondary | ICD-10-CM | POA: Diagnosis present

## 2018-09-10 LAB — CBC
HEMATOCRIT: 32.6 % — AB (ref 36.0–46.0)
Hemoglobin: 10.4 g/dL — ABNORMAL LOW (ref 12.0–15.0)
MCH: 30.6 pg (ref 26.0–34.0)
MCHC: 31.9 g/dL (ref 30.0–36.0)
MCV: 95.9 fL (ref 80.0–100.0)
NRBC: 0 % (ref 0.0–0.2)
Platelets: 199 10*3/uL (ref 150–400)
RBC: 3.4 MIL/uL — ABNORMAL LOW (ref 3.87–5.11)
RDW: 13.3 % (ref 11.5–15.5)
WBC: 14.2 10*3/uL — ABNORMAL HIGH (ref 4.0–10.5)

## 2018-09-10 LAB — BASIC METABOLIC PANEL
Anion gap: 12 (ref 5–15)
BUN: 56 mg/dL — AB (ref 8–23)
CO2: 25 mmol/L (ref 22–32)
CREATININE: 3.82 mg/dL — AB (ref 0.44–1.00)
Calcium: 8.5 mg/dL — ABNORMAL LOW (ref 8.9–10.3)
Chloride: 103 mmol/L (ref 98–111)
GFR calc non Af Amer: 9 mL/min — ABNORMAL LOW (ref >60)
GFR, EST AFRICAN AMERICAN: 11 mL/min — AB (ref >60)
GLUCOSE: 60 mg/dL — AB (ref 70–99)
Potassium: 4.8 mmol/L (ref 3.5–5.1)
Sodium: 140 mmol/L (ref 135–145)

## 2018-09-10 LAB — HEPATIC FUNCTION PANEL
ALT: 17 U/L (ref 0–44)
AST: 31 U/L (ref 15–41)
Albumin: 3.2 g/dL — ABNORMAL LOW (ref 3.5–5.0)
Alkaline Phosphatase: 102 U/L (ref 38–126)
BILIRUBIN INDIRECT: 0.9 mg/dL (ref 0.3–0.9)
Bilirubin, Direct: 0.2 mg/dL (ref 0.0–0.2)
TOTAL PROTEIN: 6.2 g/dL — AB (ref 6.5–8.1)
Total Bilirubin: 1.1 mg/dL (ref 0.3–1.2)

## 2018-09-10 LAB — GLUCOSE, CAPILLARY
GLUCOSE-CAPILLARY: 158 mg/dL — AB (ref 70–99)
GLUCOSE-CAPILLARY: 90 mg/dL (ref 70–99)
Glucose-Capillary: 119 mg/dL — ABNORMAL HIGH (ref 70–99)
Glucose-Capillary: 61 mg/dL — ABNORMAL LOW (ref 70–99)
Glucose-Capillary: 170 mg/dL — ABNORMAL HIGH (ref 70–99)

## 2018-09-10 LAB — MRSA PCR SCREENING: MRSA by PCR: NEGATIVE

## 2018-09-10 MED ORDER — SODIUM CHLORIDE 0.9 % IV SOLN
INTRAVENOUS | Status: AC
  Administered 2018-09-10: 03:00:00 via INTRAVENOUS

## 2018-09-10 MED ORDER — VANCOMYCIN VARIABLE DOSE PER UNSTABLE RENAL FUNCTION (PHARMACIST DOSING): Status: DC

## 2018-09-10 MED ORDER — INSULIN ASPART 100 UNIT/ML ~~LOC~~ SOLN: 0.0000 [IU] | Freq: Every day | SUBCUTANEOUS | Status: DC

## 2018-09-10 MED ORDER — ENOXAPARIN SODIUM 40 MG/0.4ML ~~LOC~~ SOLN: 40.0000 mg | SUBCUTANEOUS | Status: DC

## 2018-09-10 MED ORDER — INSULIN ASPART 100 UNIT/ML ~~LOC~~ SOLN
0.0000 [IU] | Freq: Three times a day (TID) | SUBCUTANEOUS | Status: DC
  Administered 2018-09-10 – 2018-09-11 (×2): 2 [IU] via SUBCUTANEOUS
  Administered 2018-09-11: 1 [IU] via SUBCUTANEOUS
  Administered 2018-09-12: 2 [IU] via SUBCUTANEOUS
  Administered 2018-09-12 – 2018-09-13 (×2): 1 [IU] via SUBCUTANEOUS
  Administered 2018-09-14: 3 [IU] via SUBCUTANEOUS
  Administered 2018-09-14: 2 [IU] via SUBCUTANEOUS
  Administered 2018-09-14: 1 [IU] via SUBCUTANEOUS
  Filled 2018-09-10 (×10): qty 1

## 2018-09-10 MED ORDER — GUAIFENESIN-DM 100-10 MG/5ML PO SYRP
5.0000 mL | ORAL_SOLUTION | ORAL | Status: DC | PRN
  Administered 2018-09-10: 5 mL via ORAL
  Filled 2018-09-10: qty 5

## 2018-09-10 MED ORDER — ONDANSETRON HCL 4 MG/2ML IJ SOLN: 4.0000 mg | Freq: Four times a day (QID) | INTRAMUSCULAR | Status: DC | PRN

## 2018-09-10 MED ORDER — ASPIRIN EC 81 MG PO TBEC
81.0000 mg | DELAYED_RELEASE_TABLET | Freq: Every day | ORAL | Status: DC
  Administered 2018-09-10 – 2018-09-15 (×6): 81 mg via ORAL
  Filled 2018-09-10 (×6): qty 1

## 2018-09-10 MED ORDER — GABAPENTIN 300 MG PO CAPS
300.0000 mg | ORAL_CAPSULE | Freq: Every day | ORAL | Status: DC
  Administered 2018-09-11 – 2018-09-15 (×5): 300 mg via ORAL
  Filled 2018-09-10 (×5): qty 1

## 2018-09-10 MED ORDER — ACETAMINOPHEN 325 MG PO TABS
650.0000 mg | ORAL_TABLET | Freq: Four times a day (QID) | ORAL | Status: DC | PRN
  Administered 2018-09-10 – 2018-09-14 (×6): 650 mg via ORAL
  Filled 2018-09-10 (×8): qty 2

## 2018-09-10 MED ORDER — SODIUM CHLORIDE 0.9 % IV SOLN
1.0000 g | INTRAVENOUS | Status: DC
  Administered 2018-09-11 – 2018-09-13 (×3): 1 g via INTRAVENOUS
  Filled 2018-09-10 (×3): qty 1

## 2018-09-10 MED ORDER — ISOSORBIDE MONONITRATE ER 30 MG PO TB24
30.0000 mg | ORAL_TABLET | Freq: Every day | ORAL | Status: DC
  Administered 2018-09-10 – 2018-09-15 (×6): 30 mg via ORAL
  Filled 2018-09-10 (×7): qty 1

## 2018-09-10 MED ORDER — ATORVASTATIN CALCIUM 10 MG PO TABS
10.0000 mg | ORAL_TABLET | Freq: Every day | ORAL | Status: DC
  Administered 2018-09-10 – 2018-09-14 (×4): 10 mg via ORAL
  Filled 2018-09-10 (×4): qty 1

## 2018-09-10 MED ORDER — ONDANSETRON HCL 4 MG PO TABS: 4.0000 mg | ORAL_TABLET | Freq: Four times a day (QID) | ORAL | Status: DC | PRN

## 2018-09-10 MED ORDER — METOPROLOL TARTRATE 50 MG PO TABS
50.0000 mg | ORAL_TABLET | Freq: Two times a day (BID) | ORAL | Status: DC
  Administered 2018-09-11 – 2018-09-15 (×7): 50 mg via ORAL
  Filled 2018-09-10 (×10): qty 1

## 2018-09-10 MED ORDER — FUROSEMIDE 20 MG PO TABS
10.0000 mg | ORAL_TABLET | Freq: Every day | ORAL | Status: DC
  Administered 2018-09-10: 10 mg via ORAL
  Filled 2018-09-10: qty 1

## 2018-09-10 MED ORDER — LEVOTHYROXINE SODIUM 100 MCG PO TABS
100.0000 ug | ORAL_TABLET | Freq: Every day | ORAL | Status: DC
  Administered 2018-09-10 – 2018-09-15 (×5): 100 ug via ORAL
  Filled 2018-09-10 (×5): qty 1

## 2018-09-10 MED ORDER — HEPARIN SODIUM (PORCINE) 5000 UNIT/ML IJ SOLN
5000.0000 [IU] | Freq: Three times a day (TID) | INTRAMUSCULAR | Status: DC
  Administered 2018-09-10 – 2018-09-14 (×14): 5000 [IU] via SUBCUTANEOUS
  Filled 2018-09-10 (×14): qty 1

## 2018-09-10 MED ORDER — GABAPENTIN 300 MG PO CAPS
300.0000 mg | ORAL_CAPSULE | Freq: Three times a day (TID) | ORAL | Status: DC
  Administered 2018-09-10: 300 mg via ORAL
  Filled 2018-09-10: qty 1

## 2018-09-10 MED ORDER — ENSURE ENLIVE PO LIQD
237.0000 mL | Freq: Two times a day (BID) | ORAL | Status: DC
  Administered 2018-09-11 – 2018-09-15 (×8): 237 mL via ORAL

## 2018-09-10 MED ORDER — ACETAMINOPHEN 650 MG RE SUPP: 650.0000 mg | Freq: Four times a day (QID) | RECTAL | Status: DC | PRN

## 2018-09-10 NOTE — NC FL2 (Signed)
[place] MEDICAID FL2 LEVEL OF CARE SCREENING TOOL     IDENTIFICATION  Patient Name: [name] [name] Birthdate: Dec 08, 1924 Sex: female Admission Date (Current Location): 09/09/2018  [facility] and [place] Number:  [address] ([id number]) Facility and Address:  [facility], [address] [place], [place] [address]      Provider Number: [id number]  Attending Physician Name and Address:  Delfino Lovett, MD  Relative Name and Phone Number:       Current Level of Care: Hospital Recommended Level of Care: Skilled Nursing Facility Prior Approval Number:    Date Approved/Denied:   PASRR Number: [id number])  Discharge Plan: SNF    Current Diagnoses: Patient Active Problem List   Diagnosis Date Noted  . HCAP (healthcare-associated pneumonia) 09/10/2018  . Acute renal failure superimposed on chronic kidney disease (HCC) 09/10/2018  . Hypertensive heart disease 05/15/2014  . Candidal dermatitis 05/15/2014  . Generalized weakness 05/15/2014  . Pressure ulcer, stage 2 (HCC) 05/15/2014  . Neuropathic pain 05/15/2014  . Absence of bladder continence 05/15/2014  . Diabetes mellitus type 2 with complications (HCC) 05/14/2014  . Hypothyroidism 05/14/2014  . Gout, chronic 05/14/2014  . Constipation 05/14/2014  . Coronary artery disease   . Chronic diastolic CHF (congestive heart failure) (HCC) 03/04/2013  . Essential hypertension 12/13/2012  . Tachycardia 12/11/2011  . SOB (shortness of breath) 03/12/2011  . S/P CABG (coronary artery bypass graft) 03/12/2011  . PVD (peripheral vascular disease) (HCC) 03/12/2011  . Hyperlipemia 03/12/2011  . Edema 03/12/2011    Orientation RESPIRATION BLADDER Height & Weight     Self, Time, Situation, Place  Normal Continent Weight: 165 lb 5.5 oz (75 kg) Height:  5\' 2"  (157.5 cm)  BEHAVIORAL SYMPTOMS/MOOD NEUROLOGICAL BOWEL NUTRITION STATUS      Incontinent Diet(Diet: Carb Modified. )  AMBULATORY STATUS  COMMUNICATION OF NEEDS Skin   Extensive Assist Verbally Normal                       Personal Care Assistance Level of Assistance  Bathing, Feeding, Dressing Bathing Assistance: Limited assistance Feeding assistance: Independent Dressing Assistance: Limited assistance     Functional Limitations Info  Sight, Hearing, Speech Sight Info: Adequate Hearing Info: Adequate Speech Info: Adequate    SPECIAL CARE FACTORS FREQUENCY                       Contractures      Additional Factors Info  Code Status, Allergies Code Status Info: (DNR ) Allergies Info: (Acetaminophen, Codeine)           Current Medications (09/10/2018):  This is the current hospital active medication list Current Facility-Administered Medications  Medication Dose Route Frequency Provider Last Rate Last Dose  . acetaminophen (TYLENOL) tablet 650 mg  650 mg Oral Q6H PRN Oralia Manis, MD   650 mg at 09/10/18 1026   Or  . acetaminophen (TYLENOL) suppository 650 mg  650 mg Rectal Q6H PRN Oralia Manis, MD      . aspirin EC tablet 81 mg  81 mg Oral Daily Oralia Manis, MD   81 mg at 09/10/18 1001  . atorvastatin (LIPITOR) tablet 10 mg  10 mg Oral Q supper Oralia Manis, MD      . Melene Muller ON 09/11/2018] ceFEPIme (MAXIPIME) 1 g in sodium chloride 0.9 % 100 mL IVPB  1 g Intravenous Q24H Oralia Manis, MD      . feeding supplement (ENSURE ENLIVE) (ENSURE ENLIVE) liquid 237 mL  237 mL Oral BID BM Delfino Lovett, MD      . gabapentin (NEURONTIN) capsule 300 mg  300 mg Oral TID Oralia Manis, MD   300 mg at 09/10/18 1002  . guaiFENesin-dextromethorphan (ROBITUSSIN DM) 100-10 MG/5ML syrup 5 mL  5 mL Oral Q4H PRN Oralia Manis, MD   5 mL at 09/10/18 0330  . heparin injection 5,000 Units  5,000 Units Subcutaneous Tedra Coupe Oralia Manis, MD   5,000 Units at 09/10/18 0654  . insulin aspart (novoLOG) injection 0-5 Units  0-5 Units Subcutaneous QHS Oralia Manis, MD      . insulin aspart (novoLOG) injection 0-9 Units  0-9  Units Subcutaneous TID WC Oralia Manis, MD   2 Units at 09/10/18 1216  . isosorbide mononitrate (IMDUR) 24 hr tablet 30 mg  30 mg Oral Daily Oralia Manis, MD   30 mg at 09/10/18 1001  . levothyroxine (SYNTHROID, LEVOTHROID) tablet 100 mcg  100 mcg Oral QAC breakfast Oralia Manis, MD   100 mcg at 09/10/18 0750  . [START ON 09/11/2018] metoprolol tartrate (LOPRESSOR) tablet 50 mg  50 mg Oral BID Oralia Manis, MD      . ondansetron [facility]) tablet 4 mg  4 mg Oral Q6H PRN Oralia Manis, MD       Or  . ondansetron [facility]) injection 4 mg  4 mg Intravenous Q6H PRN Oralia Manis, MD      . vancomycin variable dose per unstable renal function (pharmacist dosing)   Does not apply See admin instructions Marty Heck, [facility]         Discharge Medications: Please see discharge summary for a list of discharge medications.  Relevant Imaging Results:  Relevant Lab Results:   Additional Information (SSN: [id number])  Trellis Vanoverbeke, Darleen Crocker, LCSW

## 2018-09-10 NOTE — Progress Notes (Signed)
Initial Nutrition Assessment  DOCUMENTATION CODES:   Obesity unspecified  INTERVENTION:   - Liberalize diet from Heart Healthy/Carb Modified to Carb Modified  - Ensure Enlive po BID, each supplement provides 350 kcal and 20 grams of protein  - Encourage adequate PO intake  NUTRITION DIAGNOSIS:   Inadequate oral intake related to poor appetite, acute illness (HCAP) as evidenced by per patient/family report.  GOAL:   Patient will meet greater than or equal to 90% of their needs  MONITOR:   PO intake, Supplement acceptance, Weight trends, Labs  REASON FOR ASSESSMENT:   Malnutrition Screening Tool    ASSESSMENT:   84 year old female who presented to the ED on 10/31 from [facility] with hypoglycemia and SOB. PMH significant for type 2 diabetes mellitus, CHF, CAD, DVT, hyperlipidemia, hypertension, and hypothyroidism. Pt found to have HCAP.  Spoke with pt and daughter at bedside. Pt's daughter reports that pt had nothing to eat yesterday up until the evening when she had crackers and peanut butter.  Pt shares that she has had a poor appetite over the past few days due to not feeling well. Pt reports that she typically has a good appetite and eats 3 meals daily at her SNF. Pt unable to provide examples of what she may eat at a meal. Pt states that they are "regular meals."  Pt denies drinking oral nutrition supplements but is agreeable to trying them during admission. RD to order Ensure Enlive.  Pt endorses gradual weight loss but is unsure of her UBW or how much weight she as lost. Weight history in chart is limited as last weight PTA is from March 2018. Pt's weight appears to remained stable since that time (75.3 kg to 75 kg this admission).  Pt shares that she has been more active recently due to PT and walks around the table at her SNF.  Noted unfinished breakfast meal tray at bedside. Pt had completed ~50% of [place] toast.  Medications reviewed and include: Lasix 10  mg daily, sliding scale Novolog  Labs reviewed: BUN 56 (H), creatinine 3.82 (H) CBG's: 119, 61, 63  NUTRITION - FOCUSED PHYSICAL EXAM:    Most Recent Value  Orbital Region  No depletion  Upper Arm Region  Mild depletion  Thoracic and Lumbar Region  No depletion  Buccal Region  No depletion  Temple Region  Mild depletion  Clavicle Bone Region  No depletion  Clavicle and Acromion Bone Region  Mild depletion  Scapular Bone Region  Unable to assess  Dorsal Hand  Mild depletion  Patellar Region  No depletion  Anterior Thigh Region  Mild depletion  Posterior Calf Region  No depletion  Edema (RD Assessment)  Mild [BLE]  Hair  Reviewed  Eyes  Reviewed  Mouth  Reviewed  Skin  Reviewed  Nails  Reviewed       Diet Order:   Diet Order            Diet Carb Modified Fluid consistency: Thin; Room service appropriate? Yes  Diet effective now              EDUCATION NEEDS:   No education needs have been identified at this time  Skin:  Skin Assessment: Reviewed RN Assessment  Last BM:  11/1  Height:   Ht Readings from Last 1 Encounters:  09/09/18 5\' 2"  (1.575 m)    Weight:   Wt Readings from Last 1 Encounters:  09/09/18 75 kg    Ideal Body Weight:  50 kg  BMI:  Body mass index is 30.24 kg/m.  Estimated Nutritional Needs:   Kcal:  1300-1500  Protein:  70-80 grams  Fluid:  1.3-1.5 L    Earma Reading, MS, RD, LDN Inpatient Clinical Dietitian Pager: [phone] Weekend/After Hours: [phone]

## 2018-09-10 NOTE — Consult Note (Signed)
[facility]  CONSULT NOTE    Date: 09/10/2018                  Patient Name:  [name] [name]  MRN: [id number]  DOB: 12/22/1924  Age / Sex: 84 y.o., female         PCP: Dorothey Baseman, MD                 Service Requesting Consult: Dr. Sherryll Burger                 Reason for Consult: Acute renal failure            History of Present Illness: [name] is a 84 y.o. black female  who was admitted to [facility] on 09/09/2018 for Hypoglycemia [E16.2] Hypoxia [R09.02] HCAP (healthcare-associated pneumonia) [J18.9]   Daughter at bedside. Patient is at [facility]. Witness aspiration. Presented with poor appetite and shortness of breath.   Started on NS at 103mL/hr Empiric vancomycin and cefepime  Medications: Outpatient medications: Medications Prior to Admission  Medication Sig Dispense Refill Last Dose  . allopurinol (ZYLOPRIM) 100 MG tablet Take 100 mg by mouth daily.    unknown at unknown  . Amino Acids-Protein Hydrolys (FEEDING SUPPLEMENT, PRO-STAT SUGAR FREE 64,) LIQD Take 30 mLs by mouth 2 (two) times daily.   unknown at unknown  . aspirin EC 81 MG tablet Take 81 mg by mouth daily.    unknown at unknown  . atorvastatin (LIPITOR) 10 MG tablet Take 10 mg by mouth daily with supper.   unknown at unknown  . furosemide (LASIX) 20 MG tablet Take 10 mg by mouth daily.   unknown at unknown  . gabapentin (NEURONTIN) 300 MG capsule Take 300 mg by mouth 3 (three) times daily.   unknown at unknown  . insulin aspart (NOVOLOG) 100 UNIT/ML injection Inject 0-10 Units into the skin 4 (four) times daily -  before meals and at bedtime. If blood sugar is less than 60, call MD If blood sugar is 201-250, give 2 units If blood sugar is 251-300, give 4 units If blood sugar is 301-350, give 6 units If blood sugar is 351-400, give 8 units If blood sugar is 401-450, give 10 units If blood sugar is >450, call MD   unknown at unknown  . iron polysaccharides (NIFEREX) 150 MG capsule  Take 150 mg by mouth daily.    unknown at unknown  . isosorbide mononitrate (IMDUR) 30 MG 24 hr tablet TAKE ONE TABLET BY MOUTH EVERY DAY (Patient taking differently: Take 30 mg by mouth daily. ) 90 tablet 3 unknown at unknown  . LEVEMIR 100 UNIT/ML injection Inject 38 Units into the skin daily.    unknown at unknown  . levothyroxine (SYNTHROID, LEVOTHROID) 100 MCG tablet Take 100 mcg by mouth daily before breakfast.   unknown at unknown  . linagliptin (TRADJENTA) 5 MG TABS tablet Take 5 mg by mouth daily.   unknown at unknown  . losartan (COZAAR) 100 MG tablet Take 100 mg by mouth daily.   unknown at unknown  . metFORMIN (GLUCOPHAGE-XR) 750 MG 24 hr tablet Take 750 mg by mouth 2 (two) times daily.    unknown at unknown  . metoprolol (LOPRESSOR) 50 MG tablet Take 50 mg by mouth 2 (two) times daily.   unknown at unknown  . nitrofurantoin, macrocrystal-monohydrate, (MACROBID) 100 MG capsule Take 100 mg by mouth 2 (two) times daily.   unknown at unknown  . polyethylene  glycol (MIRALAX / GLYCOLAX) packet Take 17 g by mouth daily at 6 PM.    unknown at unknown  . vitamin B-12 (CYANOCOBALAMIN) 1000 MCG tablet Take 1,000 mcg by mouth daily.   unknown at unknown  . atorvastatin (LIPITOR) 40 MG tablet Take 1 tablet (40 mg total) by mouth daily. (Patient not taking: Reported on 09/10/2018) 90 tablet 3 Not Taking at Unknown time    Current medications: Current Facility-Administered Medications  Medication Dose Route Frequency Provider Last Rate Last Dose  . 0.9 %  sodium chloride infusion   Intravenous Continuous Oralia Manis, MD 75 mL/hr at 09/10/18 0316    . acetaminophen (TYLENOL) tablet 650 mg  650 mg Oral Q6H PRN Oralia Manis, MD   650 mg at 09/10/18 1026   Or  . acetaminophen (TYLENOL) suppository 650 mg  650 mg Rectal Q6H PRN Oralia Manis, MD      . aspirin EC tablet 81 mg  81 mg Oral Daily Oralia Manis, MD   81 mg at 09/10/18 1001  . atorvastatin (LIPITOR) tablet 10 mg  10 mg Oral Q supper  Oralia Manis, MD      . Melene Muller ON 09/11/2018] ceFEPIme (MAXIPIME) 1 g in sodium chloride 0.9 % 100 mL IVPB  1 g Intravenous Q24H Oralia Manis, MD      . feeding supplement (ENSURE ENLIVE) (ENSURE ENLIVE) liquid 237 mL  237 mL Oral BID BM Sherryll Burger, Vipul, MD      . furosemide (LASIX) tablet 10 mg  10 mg Oral Daily Oralia Manis, MD   10 mg at 09/10/18 1001  . gabapentin (NEURONTIN) capsule 300 mg  300 mg Oral TID Oralia Manis, MD   300 mg at 09/10/18 1002  . guaiFENesin-dextromethorphan (ROBITUSSIN DM) 100-10 MG/5ML syrup 5 mL  5 mL Oral Q4H PRN Oralia Manis, MD   5 mL at 09/10/18 0330  . heparin injection 5,000 Units  5,000 Units Subcutaneous Tedra Coupe Oralia Manis, MD   5,000 Units at 09/10/18 0654  . insulin aspart (novoLOG) injection 0-5 Units  0-5 Units Subcutaneous QHS Oralia Manis, MD      . insulin aspart (novoLOG) injection 0-9 Units  0-9 Units Subcutaneous TID WC Oralia Manis, MD      . isosorbide mononitrate (IMDUR) 24 hr tablet 30 mg  30 mg Oral Daily Oralia Manis, MD   30 mg at 09/10/18 1001  . levothyroxine (SYNTHROID, LEVOTHROID) tablet 100 mcg  100 mcg Oral QAC breakfast Oralia Manis, MD   100 mcg at 09/10/18 0750  . [START ON 09/11/2018] metoprolol tartrate (LOPRESSOR) tablet 50 mg  50 mg Oral BID Oralia Manis, MD      . ondansetron [facility]) tablet 4 mg  4 mg Oral Q6H PRN Oralia Manis, MD       Or  . ondansetron [facility]) injection 4 mg  4 mg Intravenous Q6H PRN Oralia Manis, MD      . vancomycin variable dose per unstable renal function (pharmacist dosing)   Does not apply See admin instructions Marty Heck, [facility]          Allergies: Allergies  Allergen Reactions  . Acetaminophen     Upset stomach  . Codeine       Past Medical History: Past Medical History:  Diagnosis Date  . Blood clot of neck vein   . Chronic diastolic CHF (congestive heart failure) (HCC)    a. December 01, 2011 shows normal systolic function, ejection fraction greater than 55%, mild LVH, mild TR,  moderate right ventricular systolic pressures.  . Coronary artery disease    a. s/p CABG in [place] - late 90's/early 2000's;  b. s/p stenting  . Diabetes mellitus   . DVT (deep venous thrombosis) (HCC)    a. RLE - chronic xarelto.  . Hyperlipidemia   . Hypertension   . Hypothyroidism   . Liver cyst   . PVD (peripheral vascular disease) (HCC)      Past Surgical History: Past Surgical History:  Procedure Laterality Date  . ANGIOPLASTY     right leg  . CARDIAC CATHETERIZATION    . CORONARY ARTERY BYPASS GRAFT  1999  . THYROIDECTOMY       Family History: Family History  Family history unknown: Yes     Social History: Social History   Socioeconomic History  . Marital status: Widowed    Spouse name: Not on file  . Number of children: Not on file  . Years of education: Not on file  . Highest education level: Not on file  Occupational History  . Not on file  Social Needs  . Financial resource strain: Not on file  . Food insecurity:    Worry: Not on file    Inability: Not on file  . Transportation needs:    Medical: Not on file    Non-medical: Not on file  Tobacco Use  . Smoking status: Former Smoker    Packs/day: 1.00    Types: Cigarettes    Last attempt to quit: 03/11/1998    Years since quitting: 20.5  Substance and Sexual Activity  . Alcohol use: No  . Drug use: No  . Sexual activity: Not on file  Lifestyle  . Physical activity:    Days per week: Not on file    Minutes per session: Not on file  . Stress: Not on file  Relationships  . Social connections:    Talks on phone: Not on file    Gets together: Not on file    Attends religious service: Not on file    Active member of club or organization: Not on file    Attends meetings of clubs or organizations: Not on file    Relationship status: Not on file  . Intimate partner violence:    Fear of current or ex partner: Not on file    Emotionally abused: Not on file    Physically abused: Not on file     Forced sexual activity: Not on file  Other Topics Concern  . Not on file  Social History Narrative  . Not on file     Review of Systems: Review of Systems  Constitutional: Positive for chills, fever, malaise/fatigue and weight loss. Negative for diaphoresis.  HENT: Negative.  Negative for congestion, ear discharge, ear pain, hearing loss, nosebleeds, sinus pain, sore throat and tinnitus.   Eyes: Negative.  Negative for blurred vision, double vision, photophobia, pain, discharge and redness.  Respiratory: Negative.  Negative for cough, hemoptysis, sputum production, shortness of breath, wheezing and stridor.   Cardiovascular: Negative.  Negative for chest pain, palpitations, orthopnea, claudication, leg swelling and PND.  Gastrointestinal: Negative.  Negative for abdominal pain, blood in stool, constipation, diarrhea, heartburn, melena, nausea and vomiting.  Genitourinary: Negative.  Negative for dysuria, flank pain, frequency, hematuria and urgency.  Musculoskeletal: Negative.  Negative for back pain, falls, joint pain, myalgias and neck pain.  Skin: Negative.  Negative for itching and rash.  Neurological: Negative for dizziness, tingling, tremors, sensory change, speech change, focal weakness,  seizures, loss of consciousness, weakness and headaches.  Endo/Heme/Allergies: Negative.  Negative for environmental allergies and polydipsia. Does not bruise/bleed easily.  Psychiatric/Behavioral: Positive for memory loss. Negative for depression, hallucinations, substance abuse and suicidal ideas. The patient is not nervous/anxious and does not have insomnia.     Vital Signs: Blood pressure (!) 123/52, pulse 76, temperature 98.1 F (36.7 C), temperature source Oral, resp. rate 16, height 5\' 2"  (1.575 m), weight 75 kg, SpO2 97 %.  Weight trends: Filed Weights   09/09/18 2145  Weight: 75 kg    Physical Exam: General: NAD,   Head: Normocephalic, atraumatic. Moist oral mucosal membranes   Eyes: Anicteric, PERRL  Neck: Supple, trachea midline  Lungs:  Right rhonchi  Heart: Regular rate and rhythm  Abdomen:  Soft, nontender,   Extremities:  + peripheral edema.  Neurologic: Nonfocal, moving all four extremities  Skin: No lesions        Lab results: Basic Metabolic Panel: Recent Labs  Lab 09/09/18 2236 09/10/18 0614  NA 141 140  K 4.9 4.8  CL 102 103  CO2 23 25  GLUCOSE 91 60*  BUN 54* 56*  CREATININE 3.76* 3.82*  CALCIUM 8.9 8.5*    Liver Function Tests: No results for input(s): AST, ALT, ALKPHOS, BILITOT, PROT, ALBUMIN in the last 168 hours. No results for input(s): LIPASE, AMYLASE in the last 168 hours. No results for input(s): AMMONIA in the last 168 hours.  CBC: Recent Labs  Lab 09/09/18 2145 09/10/18 0614  WBC 14.6* 14.2*  NEUTROABS 12.4*  --   HGB 11.3* 10.4*  HCT 36.0 32.6*  MCV 96.8 95.9  PLT 221 199    Cardiac Enzymes: No results for input(s): CKTOTAL, CKMB, CKMBINDEX, TROPONINI in the last 168 hours.  BNP: Invalid input(s): POCBNP  CBG: Recent Labs  Lab 09/09/18 2141 09/10/18 0731 09/10/18 0829  GLUCAP 63* 61* 119*    Microbiology: Results for orders placed or performed during the hospital encounter of 09/09/18  Blood culture (routine x 2)     Status: None (Preliminary result)   Collection Time: 09/10/18 12:13 AM  Result Value Ref Range Status   Specimen Description BLOOD RIGHT ANTECUBITAL  Final   Special Requests   Final    BOTTLES DRAWN AEROBIC AND ANAEROBIC Blood Culture results may not be optimal due to an excessive volume of blood received in culture bottles   Culture   Final    NO GROWTH < 12 HOURS Performed at [facility], [address] [place], [place] [address]    Report Status PENDING  Incomplete  Blood culture (routine x 2)     Status: None (Preliminary result)   Collection Time: 09/10/18 12:13 AM  Result Value Ref Range Status   Specimen Description BLOOD LEFT ANTECUBITAL  Final   Special  Requests   Final    BOTTLES DRAWN AEROBIC AND ANAEROBIC Blood Culture adequate volume   Culture   Final    NO GROWTH < 12 HOURS Performed at [facility], [address], [place], [place] [address]    Report Status PENDING  Incomplete  MRSA PCR Screening     Status: None   Collection Time: 09/10/18  2:03 AM  Result Value Ref Range Status   MRSA by PCR NEGATIVE NEGATIVE Final    Comment:        The GeneXpert MRSA Assay (FDA approved for NASAL specimens only), is one component of a comprehensive MRSA colonization surveillance program. It is not intended to  diagnose MRSA infection nor to guide or monitor treatment for MRSA infections. Performed at [facility], [address] [place], [place] [address]     Coagulation Studies: No results for input(s): LABPROT, INR in the last 72 hours.  Urinalysis: No results for input(s): COLORURINE, LABSPEC, PHURINE, GLUCOSEU, HGBUR, BILIRUBINUR, KETONESUR, PROTEINUR, UROBILINOGEN, NITRITE, LEUKOCYTESUR in the last 72 hours.  Invalid input(s): APPERANCEUR    Imaging: Dg Chest 2 View  Result Date: 09/09/2018 CLINICAL DATA:  Cough. EXAM: CHEST - 2 VIEW COMPARISON:  07/04/2014 FINDINGS: Lungs are adequately inflated with hazy opacification over the left base likely effusion with atelectasis. There is minimal hazy density over the right base. Infection in the lung bases is possible. Moderate cardiomegaly. Degenerate change of the spine. IMPRESSION: Hazy opacification over the left base worse than the right base likely due to left-sided effusion with bibasilar atelectasis. Infection within the lung bases is possible. Moderate cardiomegaly. Electronically Signed   By: Elberta Fortis M.D.   On: 09/09/2018 22:52      Assessment & Plan: [name] [name] is a 84 y.o. black female Jehov[employer] Witness with diabetes mellitus, coronary artery disease status post CABG, history of DVT, hypothyroidism, peripheral vascular disease,  hypertension, who was admitted to [facility] on 09/09/2018 for pneumonia  Acute renal failure versus progression of chronic kidney disease stage IV.  Chronic kidney disease secondary to diabetic nephropathy, obstructive uropathy and hypertension  - Continue IV fluids - Discontinue furosemide - Check urine studies.    LOS: 0 Casmira Cramer 11/1/201911:29 AM

## 2018-09-10 NOTE — Clinical Social Work Note (Signed)
Clinical Social Work Assessment  Patient Details  Name: [name] [name] MRN: [id number] Date of Birth: 04/27/1925  Date of referral:  09/10/18               Reason for consult:  Other (Comment Required)(From [facility] )                Permission sought to share information with:  Facility Art therapist granted to share information::  Yes, Verbal Permission Granted  Name::      [facility]   Agency::     Relationship::     Contact Information:     Housing/Transportation Living arrangements for the past 2 months:  [place] of Information:  Patient, Adult Children, Facility Patient Interpreter Needed:  None Criminal Activity/Legal Involvement Pertinent to Current Situation/Hospitalization:  No - Comment as needed Significant Relationships:  Adult Children Lives with:  Facility Resident Do you feel safe going back to the place where you live?  Yes Need for family participation in patient care:  Yes (Comment)  Care giving concerns:  Patient is a long term care SNF resident at [facility].    Social Worker assessment / plan:  Holiday representative (CSW) reviewed chart and noted that patient is from [facility]. Per Otila Kluver [facility] liaison patient is a long term care SNF resident and can return to [facility] when medically stable. CSW met with patient alone at bedside to discuss D/C plan. Patient was alert and oriented X4 and was laying in the bed. CSW introduced self and explained role of CSW department. Per patient he she has been at [facility] for several years and is agreeable to return there. CSW contacted patient's daughter [name] [name] and made her aware of above. Daughter is agreeable for patient to return to [facility]. FL2 complete. CSW will continue to follow and assist as needed.   Employment status:  Disabled (Comment on whether or not currently receiving Disability), Retired Forensic scientist:  Medicaid In [place], [employer] PT Recommendations:  Not assessed  at this time Information / Referral to community resources:  [place]  Patient/Family's Response to care:  Patient is agreeable to D/C back to [facility].   Patient/Family's Understanding of and Emotional Response to Diagnosis, Current Treatment, and Prognosis:  Patient and her daughter were very pleasant and thanked CSW for assistance.   Emotional Assessment Appearance:  Appears stated age Attitude/Demeanor/Rapport:    Affect (typically observed):  Accepting, Adaptable, Pleasant Orientation:  Oriented to Self, Oriented to Place, Oriented to  Time, Oriented to Situation Alcohol / Substance use:  Not Applicable Psych involvement (Current and /or in the community):  No (Comment)  Discharge Needs  Concerns to be addressed:  Discharge Planning Concerns Readmission within the last 30 days:  No Current discharge risk:  Dependent with Mobility Barriers to Discharge:  Continued Medical Work up   [employer], Veronia Beets, LCSW 09/10/2018, 1:49 PM

## 2018-09-10 NOTE — H&P (Signed)
[facility] [place] at [facility]   PATIENT NAME: [name] [name]    MR#:  [id number]  DATE OF BIRTH:  31-Oct-1925  DATE OF ADMISSION:  09/09/2018  PRIMARY CARE PHYSICIAN: Dorothey Baseman, MD   REQUESTING/REFERRING PHYSICIAN: Derrill Kay, MD  CHIEF COMPLAINT:   Chief Complaint  Patient presents with  . Shortness of Breath  . Hypoglycemia    HISTORY OF PRESENT ILLNESS:  [name] [name]  is a 84 y.o. female who presents with chief complaint as above.  Patient presented from nursing facility due to shortness of breath.  She is a poor historian and is unable to contribute significant details to her history.  However, she does state that she has had a cough recently.  From that she feels generally unwell.  Work-up here in the ED shows leukocytosis and likely pneumonia on chest x-ray.  She does not meet sepsis criteria.  Hospitalist were called for admission  PAST MEDICAL HISTORY:   Past Medical History:  Diagnosis Date  . Blood clot of neck vein   . Chronic diastolic CHF (congestive heart failure) (HCC)    a. December 01, 2011 shows normal systolic function, ejection fraction greater than 55%, mild LVH, mild TR, moderate right ventricular systolic pressures.  . Coronary artery disease    a. s/p CABG in [place] - late 90's/early 2000's;  b. s/p stenting  . Diabetes mellitus   . DVT (deep venous thrombosis) (HCC)    a. RLE - chronic xarelto.  . Hyperlipidemia   . Hypertension   . Hypothyroidism   . Liver cyst   . PVD (peripheral vascular disease) (HCC)      PAST SURGICAL HISTORY:   Past Surgical History:  Procedure Laterality Date  . ANGIOPLASTY     right leg  . CARDIAC CATHETERIZATION    . CORONARY ARTERY BYPASS GRAFT  1999  . THYROIDECTOMY       SOCIAL HISTORY:   Social History   Tobacco Use  . Smoking status: Former Smoker    Packs/day: 1.00    Types: Cigarettes    Last attempt to quit: 03/11/1998    Years since quitting: 20.5  Substance  Use Topics  . Alcohol use: No     FAMILY HISTORY:   Family History  Family history unknown: Yes     DRUG ALLERGIES:   Allergies  Allergen Reactions  . Acetaminophen     Upset stomach  . Codeine     MEDICATIONS AT HOME:   Prior to Admission medications   Medication Sig Start Date End Date Taking? Authorizing Provider  allopurinol (ZYLOPRIM) 100 MG tablet Take 100 mg by mouth daily.    Yes [provider]  Amino Acids-Protein Hydrolys (FEEDING SUPPLEMENT, PRO-STAT SUGAR FREE 64,) LIQD Take 30 mLs by mouth 2 (two) times daily.   Yes [provider]  aspirin EC 81 MG tablet Take 81 mg by mouth daily.    Yes [provider]  atorvastatin (LIPITOR) 10 MG tablet Take 10 mg by mouth daily with supper.   Yes [provider]  furosemide (LASIX) 20 MG tablet Take 10 mg by mouth daily.   Yes [provider]  gabapentin (NEURONTIN) 300 MG capsule Take 300 mg by mouth 3 (three) times daily.   Yes [provider]  insulin aspart (NOVOLOG) 100 UNIT/ML injection Inject 0-10 Units into the skin 4 (four) times daily -  before meals and at bedtime. If blood sugar is less than 60, call MD If  blood sugar is 201-250, give 2 units If blood sugar is 251-300, give 4 units If blood sugar is 301-350, give 6 units If blood sugar is 351-400, give 8 units If blood sugar is 401-450, give 10 units If blood sugar is >450, call MD   Yes [provider]  iron polysaccharides (NIFEREX) 150 MG capsule Take 150 mg by mouth daily.    Yes [provider]  isosorbide mononitrate (IMDUR) 30 MG 24 hr tablet TAKE ONE TABLET BY MOUTH EVERY DAY Patient taking differently: Take 30 mg by mouth daily.  01/02/14  Yes Gollan, Tollie Pizza, MD  LEVEMIR 100 UNIT/ML injection Inject 38 Units into the skin daily.  02/21/13  Yes [provider]  levothyroxine (SYNTHROID, LEVOTHROID) 100 MCG tablet Take 100 mcg by mouth daily before breakfast.   Yes  [provider]  linagliptin (TRADJENTA) 5 MG TABS tablet Take 5 mg by mouth daily.   Yes [provider]  losartan (COZAAR) 100 MG tablet Take 100 mg by mouth daily.   Yes [provider]  metFORMIN (GLUCOPHAGE-XR) 750 MG 24 hr tablet Take 750 mg by mouth 2 (two) times daily.    Yes [provider]  metoprolol (LOPRESSOR) 50 MG tablet Take 50 mg by mouth 2 (two) times daily.   Yes [provider]  nitrofurantoin, macrocrystal-monohydrate, (MACROBID) 100 MG capsule Take 100 mg by mouth 2 (two) times daily. 09/08/18 09/13/18 Yes [provider]  polyethylene glycol (MIRALAX / GLYCOLAX) packet Take 17 g by mouth daily at 6 PM.    Yes [provider]  vitamin B-12 (CYANOCOBALAMIN) 1000 MCG tablet Take 1,000 mcg by mouth daily.   Yes [provider]  atorvastatin (LIPITOR) 40 MG tablet Take 1 tablet (40 mg total) by mouth daily. Patient not taking: Reported on 09/10/2018 10/15/15   Antonieta Iba, MD    REVIEW OF SYSTEMS:  Review of Systems  Constitutional: Positive for malaise/fatigue. Negative for chills, fever and weight loss.  HENT: Negative for ear pain, hearing loss and tinnitus.   Eyes: Negative for blurred vision, double vision, pain and redness.  Respiratory: Positive for cough and shortness of breath. Negative for hemoptysis.   Cardiovascular: Negative for chest pain, palpitations, orthopnea and leg swelling.  Gastrointestinal: Negative for abdominal pain, constipation, diarrhea, nausea and vomiting.  Genitourinary: Negative for dysuria, frequency and hematuria.  Musculoskeletal: Negative for back pain, joint pain and neck pain.  Skin:       No acne, rash, or lesions  Neurological: Negative for dizziness, tremors, focal weakness and weakness.  Endo/Heme/Allergies: Negative for polydipsia. Does not bruise/bleed easily.  Psychiatric/Behavioral: Negative for depression. The patient is not nervous/anxious and does not  have insomnia.      VITAL SIGNS:   Vitals:   09/09/18 2345 09/10/18 0000 09/10/18 0015 09/10/18 0030  BP:    (!) 114/51  Pulse: 73 99 73 73  Resp: (!) 23 (!) 22 (!) 21 19  Temp:      TempSrc:      SpO2: 95% 100% 97% 96%  Weight:      Height:       Wt Readings from Last 3 Encounters:  09/09/18 75 kg  02/03/17 75.3 kg  04/11/16 72.6 kg    PHYSICAL EXAMINATION:  Physical Exam  Vitals reviewed. Constitutional: She appears well-developed and well-nourished. No distress.  HENT:  Head: Normocephalic and atraumatic.  Mouth/Throat: Oropharynx is clear and moist.  Eyes: Pupils are equal, round, and reactive to  light. Conjunctivae and EOM are normal. No scleral icterus.  Neck: Normal range of motion. Neck supple. No JVD present. No thyromegaly present.  Cardiovascular: Normal rate, regular rhythm and intact distal pulses. Exam reveals no gallop and no friction rub.  No murmur heard. Respiratory: Effort normal. No respiratory distress. She has no wheezes. She has no rales.  Bibasilar coarse breath sounds  GI: Soft. Bowel sounds are normal. She exhibits no distension. There is no tenderness.  Musculoskeletal: Normal range of motion. She exhibits no edema.  No arthritis, no gout  Lymphadenopathy:    She has no cervical adenopathy.  Neurological: She is alert. No cranial nerve deficit.  No dysarthria, no aphasia  Skin: Skin is warm and dry. No rash noted. No erythema.  Psychiatric: She has a normal mood and affect. Her behavior is normal. Judgment and thought content normal.    LABORATORY PANEL:   CBC Recent Labs  Lab 09/09/18 2145  WBC 14.6*  HGB 11.3*  HCT 36.0  PLT 221   ------------------------------------------------------------------------------------------------------------------  Chemistries  Recent Labs  Lab 09/09/18 2236  NA 141  K 4.9  CL 102  CO2 23  GLUCOSE 91  BUN 54*  CREATININE 3.76*  CALCIUM 8.9    ------------------------------------------------------------------------------------------------------------------  Cardiac Enzymes No results for input(s): TROPONINI in the last 168 hours. ------------------------------------------------------------------------------------------------------------------  RADIOLOGY:  Dg Chest 2 View  Result Date: 09/09/2018 CLINICAL DATA:  Cough. EXAM: CHEST - 2 VIEW COMPARISON:  07/04/2014 FINDINGS: Lungs are adequately inflated with hazy opacification over the left base likely effusion with atelectasis. There is minimal hazy density over the right base. Infection in the lung bases is possible. Moderate cardiomegaly. Degenerate change of the spine. IMPRESSION: Hazy opacification over the left base worse than the right base likely due to left-sided effusion with bibasilar atelectasis. Infection within the lung bases is possible. Moderate cardiomegaly. Electronically Signed   By: Elberta Fortis M.D.   On: 09/09/2018 22:52    EKG:   Orders placed or performed during the hospital encounter of 09/09/18  . EKG 12-Lead  . EKG 12-Lead    IMPRESSION AND PLAN:  Principal Problem:   HCAP (healthcare-associated pneumonia) -IV antibiotics started, supportive treatment PRN Active Problems:   Acute renal failure superimposed on chronic kidney disease (HCC) -unclear what the patient's baseline is currently.  Her last lab results for her creatinine are from several years ago, and she had some chronic renal disease at that time.  We will treat her with gentle IV fluids tonight, and monitor for any improvement in her creatinine.  Avoid nephrotoxins   Essential hypertension -continue home meds   Chronic diastolic CHF (congestive heart failure) (HCC) -continue home medications   Coronary artery disease -continue home meds   Diabetes mellitus type 2 with complications (HCC) -sliding scale insulin with corresponding glucose checks   Hyperlipemia -Home dose antilipid    Hypothyroidism -home dose thyroid replacement  Chart review performed and case discussed with ED provider. Labs, imaging and/or ECG reviewed by provider and discussed with patient/family. Management plans discussed with the patient and/or family.  DVT PROPHYLAXIS: SubQ heparin  GI PROPHYLAXIS:  None  ADMISSION STATUS: Inpatient      CODE STATUS: DNR Advance Directive Documentation     Most Recent Value  Type of Advance Directive  Out of facility DNR (pink MOST or yellow form)  Pre-existing out of facility DNR order (yellow form or pink MOST form)  Pink MOST form placed in chart (order not valid for inpatient use)  "  MOST" Form in Place?  -      TOTAL TIME TAKING CARE OF THIS PATIENT: 45 minutes.   [name] [name] 09/10/2018, 1:21 AM  [employer]  [phone]  CC: Primary care physician; Dorothey Baseman, MD  Note:  This document was prepared using Dragon voice recognition software and may include unintentional dictation errors.

## 2018-09-10 NOTE — Progress Notes (Signed)
Same day rounding progress note  [name] is a 84 y.o. black female [employer] with diabetes mellitus, coronary artery disease status post CABG, history of DVT, hypothyroidism, peripheral vascular disease, hypertension, who was admitted to [facility] on 09/09/2018 for pneumonia  * HCAP (healthcare-associated pneumonia)  - cefepime + vanco, await c/s  * Acute renal failure versus progression of chronic kidney disease stage IV.  Chronic kidney disease secondary to diabetic nephropathy, obstructive uropathy and hypertension - Continue IV fluids - Discontinue furosemide - Nephro c/s  * Essential hypertension -continue home meds * Chronic diastolic CHF (congestive heart failure) (HCC) -continue home medications   Coronary artery disease -continue home meds   Diabetes mellitus type 2 with complications (HCC) -sliding scale insulin with corresponding glucose checks   Hyperlipemia -Home dose antilipid   Hypothyroidism -home dose thyroid replacement  Time spent: 15 mins

## 2018-09-10 NOTE — ED Notes (Signed)
Pt changed and cleaned by this RN and Trula Ore, Charity fundraiser. Put another brief on.

## 2018-09-10 NOTE — Progress Notes (Signed)
Pharmacy Antibiotic Note  SUJEY GUNDRY is a 84 y.o. female admitted on 09/09/2018 with pneumonia.  Pharmacy has been consulted for vancomycin and cefepime dosing.  Plan: DW 60kg  Vd 42L kri 0.012 hr-1  T1/2 60 hours Vanc 1 gram given in ED. Will dose by serial levels for now due to decreased renal function  Cefepime 1 gram q 24 hours ordereed  Height: 5\' 2"  (157.5 cm) Weight: 165 lb 5.5 oz (75 kg) IBW/kg (Calculated) : 50.1  Temp (24hrs), Avg:98.3 F (36.8 C), Min:98.3 F (36.8 C), Max:98.3 F (36.8 C)  Recent Labs  Lab 09/09/18 2145 09/09/18 2236  WBC 14.6*  --   CREATININE  --  3.76*    Estimated Creatinine Clearance: 9.2 mL/min (A) (by C-G formula based on SCr of 3.76 mg/dL (H)).    Allergies  Allergen Reactions  . Acetaminophen     Upset stomach  . Codeine     Antimicrobials this admission: Vancomycin, cefepime 11/1  >>    >>   Dose adjustments this admission:   Microbiology results: 11/1 BCx: pending 11/1 MRSA PCR: pending      10/31 CXR: Infection within the lung bases is possible. Thank you for allowing pharmacy to be a part of this patient's care.  Merry [place] S 09/10/2018 1:48 AM

## 2018-09-10 NOTE — Progress Notes (Signed)
Pharmacy Antibiotic Note  [name] [name] is a 84 y.o. female admitted on 09/09/2018 with pneumonia.  Pharmacy has been consulted for vancomycin and cefepime dosing.  Plan: Continue Cefepime 1 gram IV q 24 hours ordereed  Height: 5\' 2"  (157.5 cm) Weight: 165 lb 5.5 oz (75 kg) IBW/kg (Calculated) : 50.1  Temp (24hrs), Avg:98.6 F (37 C), Min:98.1 F (36.7 C), Max:100 F (37.8 C)  Recent Labs  Lab 09/09/18 2145 09/09/18 2236 09/10/18 0614  WBC 14.6*  --  14.2*  CREATININE  --  3.76* 3.82*    Estimated Creatinine Clearance: 9.1 mL/min (A) (by C-G formula based on SCr of 3.82 mg/dL (H)).    Allergies  Allergen Reactions  . Acetaminophen     Upset stomach  . Codeine     Antimicrobials this admission: Vancomycin 11/1>>11/1 cefepime 11/1  >>    >>   Dose adjustments this admission:   Microbiology results: 11/1 BCx: pending 11/1 MRSA PCR: pending    Thank you for allowing pharmacy to be a part of this patient's care.  Marty Heck 09/10/2018 4:11 PM

## 2018-09-11 LAB — GLUCOSE, CAPILLARY
GLUCOSE-CAPILLARY: 146 mg/dL — AB (ref 70–99)
Glucose-Capillary: 124 mg/dL — ABNORMAL HIGH (ref 70–99)
Glucose-Capillary: 112 mg/dL — ABNORMAL HIGH (ref 70–99)
Glucose-Capillary: 167 mg/dL — ABNORMAL HIGH (ref 70–99)

## 2018-09-11 LAB — BASIC METABOLIC PANEL
Anion gap: 9 (ref 5–15)
BUN: 61 mg/dL — ABNORMAL HIGH (ref 8–23)
CO2: 26 mmol/L (ref 22–32)
CREATININE: 4.92 mg/dL — AB (ref 0.44–1.00)
Calcium: 8.4 mg/dL — ABNORMAL LOW (ref 8.9–10.3)
Chloride: 105 mmol/L (ref 98–111)
GFR calc non Af Amer: 7 mL/min — ABNORMAL LOW (ref >60)
GFR, EST AFRICAN AMERICAN: 8 mL/min — AB (ref >60)
GLUCOSE: 130 mg/dL — AB (ref 70–99)
Potassium: 4.8 mmol/L (ref 3.5–5.1)
Sodium: 140 mmol/L (ref 135–145)

## 2018-09-11 LAB — CBC
HEMATOCRIT: 30.4 % — AB (ref 36.0–46.0)
HEMOGLOBIN: 9.8 g/dL — AB (ref 12.0–15.0)
MCH: 30.7 pg (ref 26.0–34.0)
MCHC: 32.2 g/dL (ref 30.0–36.0)
MCV: 95.3 fL (ref 80.0–100.0)
Platelets: 177 10*3/uL (ref 150–400)
RBC: 3.19 MIL/uL — ABNORMAL LOW (ref 3.87–5.11)
RDW: 13.2 % (ref 11.5–15.5)
WBC: 10.2 10*3/uL (ref 4.0–10.5)
nRBC: 0 % (ref 0.0–0.2)

## 2018-09-11 LAB — PROCALCITONIN: PROCALCITONIN: 35.89 ng/mL

## 2018-09-11 MED ORDER — SODIUM CHLORIDE 0.9 % IV SOLN
INTRAVENOUS | Status: AC
  Administered 2018-09-11 – 2018-09-13 (×4): via INTRAVENOUS

## 2018-09-11 NOTE — Progress Notes (Signed)
Pt lunch tray at bedside. RN and NT at bedside, assisted pt to pull up in bed. Pt set up for lunch. Pt more responsive at this time. Pt states "I do it myself at [facility]" when asked about her meals. Pt educated to eat in order for her to get better to go home. Pt verbalizes understanding. No family present at this time.

## 2018-09-11 NOTE — Progress Notes (Signed)
[facility] [place] at [facility]                                                                                                                                                                                  Patient Demographics   [name] [name], is a 84 y.o. female, DOB - [birth year], ZOX:[id number]  Admit date - 09/09/2018   Admitting Physician Oralia Manis, MD  Outpatient Primary MD for the patient is Dorothey Baseman, MD   LOS - 1  Subjective: Patient confused    Review of Systems:   CONSTITUTIONAL: Unable to provide due to confusion   Vitals:   Vitals:   09/10/18 1228 09/10/18 1639 09/11/18 0009 09/11/18 0731  BP: (!) 103/52 (!) 97/50 115/62 130/60  Pulse: 81 74 85 81  Resp:  20 17 18   Temp: 100 F (37.8 C) 98.7 F (37.1 C) 99.6 F (37.6 C) 99.6 F (37.6 C)  TempSrc: Oral Oral Oral Oral  SpO2: 99% 98% 99% 91%  Weight:      Height:        Wt Readings from Last 3 Encounters:  09/09/18 75 kg  02/03/17 75.3 kg  04/11/16 72.6 kg     Intake/Output Summary (Last 24 hours) at 09/11/2018 1259 Last data filed at 09/11/2018 1017 Gross per 24 hour  Intake 120 ml  Output -  Net 120 ml    Physical Exam:   GENERAL: Chronically ill-appearing  HEAD, EYES, EARS, NOSE AND THROAT: Atraumatic, normocephalic. Extraocular muscles are intact. Pupils equal and reactive to light. Sclerae anicteric. No conjunctival injection. No oro-pharyngeal erythema.  NECK: Supple. There is no jugular venous distention. No bruits, no lymphadenopathy, no thyromegaly.  HEART: Regular rate and rhythm,. No murmurs, no rubs, no clicks.  LUNGS: Clear to auscultation bilaterally. No rales or rhonchi. No wheezes.  ABDOMEN: Soft, flat, nontender, nondistended. Has good bowel sounds. No hepatosplenomegaly appreciated.  EXTREMITIES: No evidence of any cyanosis, clubbing, or peripheral edema.  +2 pedal and radial pulses bilaterally.  NEUROLOGIC: The patient is alert, awake, and  oriented x3 with no focal motor or sensory deficits appreciated bilaterally.  SKIN: Moist and warm with no rashes appreciated.  Psych: Not anxious, depressed LN: No inguinal LN enlargement    Antibiotics   Anti-infectives (From admission, onward)   Start     Dose/Rate Route Frequency Ordered Stop   09/11/18 1000  ceFEPIme (MAXIPIME) 1 g in sodium chloride 0.9 % 100 mL IVPB     1 g 200 mL/hr over 30 Minutes Intravenous Every 24 hours 09/10/18 0148     09/10/18 1000  vancomycin variable dose per unstable renal function (pharmacist dosing)  Status:  Discontinued      Does not apply See admin instructions 09/10/18 1001 09/10/18 1500   09/09/18 2345  vancomycin (VANCOCIN) IVPB 1000 mg/200 mL premix     1,000 mg 200 mL/hr over 60 Minutes Intravenous  Once 09/09/18 2344 09/10/18 0151   09/09/18 2345  ceFEPIme (MAXIPIME) 2 g in sodium chloride 0.9 % 100 mL IVPB     2 g 200 mL/hr over 30 Minutes Intravenous  Once 09/09/18 2344 09/10/18 0046      Medications   Scheduled Meds: . aspirin EC  81 mg Oral Daily  . atorvastatin  10 mg Oral Q supper  . feeding supplement (ENSURE ENLIVE)  237 mL Oral BID BM  . gabapentin  300 mg Oral Daily  . heparin injection (subcutaneous)  5,000 Units Subcutaneous Q8H  . insulin aspart  0-5 Units Subcutaneous QHS  . insulin aspart  0-9 Units Subcutaneous TID WC  . isosorbide mononitrate  30 mg Oral Daily  . levothyroxine  100 mcg Oral QAC breakfast  . metoprolol tartrate  50 mg Oral BID   Continuous Infusions: . sodium chloride 50 mL/hr at 09/11/18 1216  . ceFEPime (MAXIPIME) IV 1 g (09/11/18 0858)   PRN Meds:.acetaminophen **OR** acetaminophen, guaiFENesin-dextromethorphan, ondansetron **OR** ondansetron (ZOFRAN) IV   Data Review:   Micro Results Recent Results (from the past 240 hour(s))  Blood culture (routine x 2)     Status: None (Preliminary result)   Collection Time: 09/10/18 12:13 AM  Result Value Ref Range Status   Specimen Description  BLOOD RIGHT ANTECUBITAL  Final   Special Requests   Final    BOTTLES DRAWN AEROBIC AND ANAEROBIC Blood Culture results may not be optimal due to an excessive volume of blood received in culture bottles   Culture   Final    NO GROWTH 1 DAY Performed at [facility], [address], [place], [place] [address]    Report Status PENDING  Incomplete  Blood culture (routine x 2)     Status: None (Preliminary result)   Collection Time: 09/10/18 12:13 AM  Result Value Ref Range Status   Specimen Description BLOOD LEFT ANTECUBITAL  Final   Special Requests   Final    BOTTLES DRAWN AEROBIC AND ANAEROBIC Blood Culture adequate volume   Culture   Final    NO GROWTH 1 DAY Performed at [facility], [address], [place], [place] [address]    Report Status PENDING  Incomplete  MRSA PCR Screening     Status: None   Collection Time: 09/10/18  2:03 AM  Result Value Ref Range Status   MRSA by PCR NEGATIVE NEGATIVE Final    Comment:        The GeneXpert MRSA Assay (FDA approved for NASAL specimens only), is one component of a comprehensive MRSA colonization surveillance program. It is not intended to diagnose MRSA infection nor to guide or monitor treatment for MRSA infections. Performed at [facility], [address], [place], [place] [address]     Radiology Reports Dg Chest 2 View  Result Date: 09/09/2018 CLINICAL DATA:  Cough. EXAM: CHEST - 2 VIEW COMPARISON:  07/04/2014 FINDINGS: Lungs are adequately inflated with hazy opacification over the left base likely effusion with atelectasis. There is minimal hazy density over the right base. Infection in the lung bases is possible. Moderate cardiomegaly. Degenerate change of the spine. IMPRESSION: Hazy opacification over the left base worse than the right base likely due to left-sided effusion with bibasilar atelectasis.  Infection within the lung bases is possible. Moderate cardiomegaly. Electronically Signed   By:  Elberta Fortis M.D.   On: 09/09/2018 22:52     CBC Recent Labs  Lab 09/09/18 2145 09/10/18 0614 09/11/18 0537  WBC 14.6* 14.2* 10.2  HGB 11.3* 10.4* 9.8*  HCT 36.0 32.6* 30.4*  PLT 221 199 177  MCV 96.8 95.9 95.3  MCH 30.4 30.6 30.7  MCHC 31.4 31.9 32.2  RDW 13.2 13.3 13.2  LYMPHSABS 1.5  --   --   MONOABS 0.5  --   --   EOSABS 0.1  --   --   BASOSABS 0.0  --   --     Chemistries  Recent Labs  Lab 09/09/18 2236 09/10/18 0614 09/11/18 0537  NA 141 140 140  K 4.9 4.8 4.8  CL 102 103 105  CO2 23 25 26   GLUCOSE 91 60* 130*  BUN 54* 56* 61*  CREATININE 3.76* 3.82* 4.92*  CALCIUM 8.9 8.5* 8.4*  AST  --  31  --   ALT  --  17  --   ALKPHOS  --  102  --   BILITOT  --  1.1  --    ------------------------------------------------------------------------------------------------------------------ estimated creatinine clearance is 7.1 mL/min (A) (by C-G formula based on SCr of 4.92 mg/dL (H)). ------------------------------------------------------------------------------------------------------------------ No results for input(s): HGBA1C in the last 72 hours. ------------------------------------------------------------------------------------------------------------------ No results for input(s): CHOL, HDL, LDLCALC, TRIG, CHOLHDL, LDLDIRECT in the last 72 hours. ------------------------------------------------------------------------------------------------------------------ No results for input(s): TSH, T4TOTAL, T3FREE, THYROIDAB in the last 72 hours.  Invalid input(s): FREET3 ------------------------------------------------------------------------------------------------------------------ No results for input(s): VITAMINB12, FOLATE, FERRITIN, TIBC, IRON, RETICCTPCT in the last 72 hours.  Coagulation profile No results for input(s): INR, PROTIME in the last 168 hours.  No results for input(s): DDIMER in the last 72 hours.  Cardiac Enzymes No results for input(s): CKMB,  TROPONINI, MYOGLOBIN in the last 168 hours.  Invalid input(s): CK ------------------------------------------------------------------------------------------------------------------ Invalid input(s): POCBNP    Assessment & Plan   * HCAP (healthcare-associated pneumonia)  -Continue cefepime + vanco, -Procalcitonin likely very high due to acute renal failure to use this as the monitoring of her infection  *Acute renal failure on chronic kidney disease stage IV.  -Renal function worse appreciate nephrology input continue IV hydration  *Essential hypertension -continue home meds  *Chronic diastolic CHF (congestive heart failure) (HCC) -continue home medications Coronary artery disease -IV Lasix on hold  Diabetes mellitus type 2 with complications (HCC) -sliding scale insulin with corresponding glucose checks  Hyperlipemia -continue Lipitor  Hypothyroidism -home dose thyroid replacement     Code Status Orders  (From admission, onward)         Start     Ordered   09/10/18 0220  Do not attempt resuscitation (DNR)  Continuous    Question Answer Comment  In the event of cardiac or respiratory ARREST Do not call a "code blue"   In the event of cardiac or respiratory ARREST Do not perform Intubation, CPR, defibrillation or ACLS   In the event of cardiac or respiratory ARREST Use medication by any route, position, wound care, and other measures to relive pain and suffering. May use oxygen, suction and manual treatment of airway obstruction as needed for comfort.      09/10/18 0220        Code Status History    This patient has a current code status but no historical code status.    Advance Directive Documentation     Most Recent  Value  Type of Advance Directive  Out of facility DNR (pink MOST or yellow form)  Pre-existing out of facility DNR order (yellow form or pink MOST form)  Pink MOST form placed in chart (order not valid for inpatient use)  "MOST" Form in  Place?  -           Consults nephrology DVT Prophylaxis heparin  Lab Results  Component Value Date   PLT 177 09/11/2018     Time Spent in minutes   35 minutes greater than 50% of time spent in care coordination and counseling patient regarding the condition and plan of care.   Auburn Bilberry M.D on 09/11/2018 at 12:59 PM  Between 7am to 6pm - Pager - [phone]  After 6pm go to [email or web address] - Social research officer, government  [employer] [facility]   Office  [phone]

## 2018-09-11 NOTE — Progress Notes (Addendum)
Pt's daughter, [name] [name], at bedside. States she is one of three POA's, but the only one located locally. She does not have paperwork. She reports that the pt is a DNR but she is not sure why she was brought to hospital. She denies that the pt is being followed with palliative or hospice. She reports that the pt is "agitated and irritable". Pt is currently resting quietly, eyes closed in bed. Educated family that pt was just given a tylenol due to her moaning this morning and that it has not had time to work. Daughter states that she is an [employer] fundraiser and wants to speak with the doctor this morning about getting the pt some "ativan because she is agitated". Educated daughter that the pt is not currently agitated per typical standards. Daughter states pt does not normally take anything for agitation at home. Educated daughter that MD will be making rounds this morning and she can speak with the MD about her concerns at that time. Pt pulled up in bed with assistance of NT. Pt asked if the position change helped. Pt refusing to answer, scooted herself back down in the bed. Bed alarm on, call bell in reach. Pt resting in bed with eyes closed at this time.

## 2018-09-11 NOTE — Progress Notes (Signed)
Pt stated she did not want her lunch, an omelet, because it "smells funny". Pt asked what she would like for lunch. Pt stated she did not know. Pt asked if she wanted a grilled cheese and some soup. Pt offered soups that are available. Pt stated she would like a grilled cheese and potato soup as well as some fruit. Order placed with nutrition for pt. Pt educated tray would be up shortly for her lunch. Pt verbalizes understanding.

## 2018-09-11 NOTE — Progress Notes (Signed)
Spoke with Dr Wynelle Link this AM. Per family discussion family states pt needs to be fed lunch. Pt is able to feed herself at baseline. Pt set up for breakfast this am by this RN. Pt encouraged to eat by this RN. Pt stated "I don't want it". Pt educated to eat. Pt refused. Requested NT to setup pt and assist with feeding.  Encouraged pt's family to participate in pt's care and encourage her to eat.

## 2018-09-11 NOTE — Progress Notes (Signed)
[facility]  ROUNDING NOTE   Subjective:   Daughter at bedside.   Patient alert to self only.   Creatinine 4.92 (3.87)  Objective:  Vital signs in last 24 hours:  Temp:  [98.7 F (37.1 C)-100 F (37.8 C)] 99.6 F (37.6 C) (11/02 0731) Pulse Rate:  [74-85] 81 (11/02 0731) Resp:  [17-20] 18 (11/02 0731) BP: (97-130)/(50-62) 130/60 (11/02 0731) SpO2:  [91 %-99 %] 91 % (11/02 0731)  Weight change:  Filed Weights   09/09/18 2145  Weight: 75 kg    Intake/Output: I/O last 3 completed shifts: In: 300.2 [IV Piggyback:300.2] Out: -    Intake/Output this shift:  Total I/O In: 120 [P.O.:120] Out: -   Physical Exam: General: NAD,   Head: Normocephalic, atraumatic. Moist oral mucosal membranes  Eyes: Anicteric, PERRL  Neck: Supple, trachea midline  Lungs:  Clear to auscultation  Heart: Regular rate and rhythm  Abdomen:  Soft, nontender,   Extremities: No  peripheral edema.  Neurologic: Alert to self  Skin: No lesions        Basic Metabolic Panel: Recent Labs  Lab 09/09/18 2236 09/10/18 0614 09/11/18 0537  NA 141 140 140  K 4.9 4.8 4.8  CL 102 103 105  CO2 23 25 26   GLUCOSE 91 60* 130*  BUN 54* 56* 61*  CREATININE 3.76* 3.82* 4.92*  CALCIUM 8.9 8.5* 8.4*    Liver Function Tests: Recent Labs  Lab 09/10/18 0614  AST 31  ALT 17  ALKPHOS 102  BILITOT 1.1  PROT 6.2*  ALBUMIN 3.2*   No results for input(s): LIPASE, AMYLASE in the last 168 hours. No results for input(s): AMMONIA in the last 168 hours.  CBC: Recent Labs  Lab 09/09/18 2145 09/10/18 0614 09/11/18 0537  WBC 14.6* 14.2* 10.2  NEUTROABS 12.4*  --   --   HGB 11.3* 10.4* 9.8*  HCT 36.0 32.6* 30.4*  MCV 96.8 95.9 95.3  PLT 221 199 177    Cardiac Enzymes: No results for input(s): CKTOTAL, CKMB, CKMBINDEX, TROPONINI in the last 168 hours.  BNP: Invalid input(s): POCBNP  CBG: Recent Labs  Lab 09/10/18 0829 09/10/18 1145 09/10/18 1640 09/10/18 2108 09/11/18 0733   GLUCAP 119* 158* 90 170* 124*    Microbiology: Results for orders placed or performed during the hospital encounter of 09/09/18  Blood culture (routine x 2)     Status: None (Preliminary result)   Collection Time: 09/10/18 12:13 AM  Result Value Ref Range Status   Specimen Description BLOOD RIGHT ANTECUBITAL  Final   Special Requests   Final    BOTTLES DRAWN AEROBIC AND ANAEROBIC Blood Culture results may not be optimal due to an excessive volume of blood received in culture bottles   Culture   Final    NO GROWTH 1 DAY Performed at [facility], [address], [place], [place] [address]    Report Status PENDING  Incomplete  Blood culture (routine x 2)     Status: None (Preliminary result)   Collection Time: 09/10/18 12:13 AM  Result Value Ref Range Status   Specimen Description BLOOD LEFT ANTECUBITAL  Final   Special Requests   Final    BOTTLES DRAWN AEROBIC AND ANAEROBIC Blood Culture adequate volume   Culture   Final    NO GROWTH 1 DAY Performed at [facility], [address] [place], [place] [address]    Report Status PENDING  Incomplete  MRSA PCR Screening     Status: None  Collection Time: 09/10/18  2:03 AM  Result Value Ref Range Status   MRSA by PCR NEGATIVE NEGATIVE Final    Comment:        The GeneXpert MRSA Assay (FDA approved for NASAL specimens only), is one component of a comprehensive MRSA colonization surveillance program. It is not intended to diagnose MRSA infection nor to guide or monitor treatment for MRSA infections. Performed at [facility], [address] [place], [place] [address]     Coagulation Studies: No results for input(s): LABPROT, INR in the last 72 hours.  Urinalysis: No results for input(s): COLORURINE, LABSPEC, PHURINE, GLUCOSEU, HGBUR, BILIRUBINUR, KETONESUR, PROTEINUR, UROBILINOGEN, NITRITE, LEUKOCYTESUR in the last 72 hours.  Invalid input(s): APPERANCEUR    Imaging: Dg Chest 2  View  Result Date: 09/09/2018 CLINICAL DATA:  Cough. EXAM: CHEST - 2 VIEW COMPARISON:  07/04/2014 FINDINGS: Lungs are adequately inflated with hazy opacification over the left base likely effusion with atelectasis. There is minimal hazy density over the right base. Infection in the lung bases is possible. Moderate cardiomegaly. Degenerate change of the spine. IMPRESSION: Hazy opacification over the left base worse than the right base likely due to left-sided effusion with bibasilar atelectasis. Infection within the lung bases is possible. Moderate cardiomegaly. Electronically Signed   By: Elberta Fortis M.D.   On: 09/09/2018 22:52     Medications:   . sodium chloride    . ceFEPime (MAXIPIME) IV 1 g (09/11/18 0858)   . aspirin EC  81 mg Oral Daily  . atorvastatin  10 mg Oral Q supper  . feeding supplement (ENSURE ENLIVE)  237 mL Oral BID BM  . gabapentin  300 mg Oral Daily  . heparin injection (subcutaneous)  5,000 Units Subcutaneous Q8H  . insulin aspart  0-5 Units Subcutaneous QHS  . insulin aspart  0-9 Units Subcutaneous TID WC  . isosorbide mononitrate  30 mg Oral Daily  . levothyroxine  100 mcg Oral QAC breakfast  . metoprolol tartrate  50 mg Oral BID   acetaminophen **OR** acetaminophen, guaiFENesin-dextromethorphan, ondansetron **OR** ondansetron (ZOFRAN) IV  Assessment/ Plan:  [name] is a 84 y.o. black female Jehov[employer] Witness with diabetes mellitus, coronary artery disease status post CABG, history of DVT, hypothyroidism, peripheral vascular disease, hypertension, who was admitted to [facility] on 09/09/2018 for pneumonia  Acute renal failure versus progression of chronic kidney disease stage IV.  Chronic kidney disease secondary to diabetic nephropathy, obstructive uropathy and hypertension  - Continue IV fluids: NS at 29mL/hr - Discussed prognosis and renal failure. Patient is not a candidate for dialysis    LOS: 1 [name] [name] 11/2/201911:31 AM

## 2018-09-12 LAB — CBC
HEMATOCRIT: 32.6 % — AB (ref 36.0–46.0)
HEMOGLOBIN: 10.4 g/dL — AB (ref 12.0–15.0)
MCH: 30.5 pg (ref 26.0–34.0)
MCHC: 31.9 g/dL (ref 30.0–36.0)
MCV: 95.6 fL (ref 80.0–100.0)
Platelets: 214 10*3/uL (ref 150–400)
RBC: 3.41 MIL/uL — AB (ref 3.87–5.11)
RDW: 13 % (ref 11.5–15.5)
WBC: 9.4 10*3/uL (ref 4.0–10.5)
nRBC: 0 % (ref 0.0–0.2)

## 2018-09-12 LAB — GLUCOSE, CAPILLARY
GLUCOSE-CAPILLARY: 195 mg/dL — AB (ref 70–99)
Glucose-Capillary: 115 mg/dL — ABNORMAL HIGH (ref 70–99)
Glucose-Capillary: 143 mg/dL — ABNORMAL HIGH (ref 70–99)
Glucose-Capillary: 146 mg/dL — ABNORMAL HIGH (ref 70–99)

## 2018-09-12 LAB — RENAL FUNCTION PANEL
ANION GAP: 12 (ref 5–15)
Albumin: 2.9 g/dL — ABNORMAL LOW (ref 3.5–5.0)
BUN: 59 mg/dL — ABNORMAL HIGH (ref 8–23)
CALCIUM: 8.6 mg/dL — AB (ref 8.9–10.3)
CHLORIDE: 107 mmol/L (ref 98–111)
CO2: 24 mmol/L (ref 22–32)
Creatinine, Ser: 5.22 mg/dL — ABNORMAL HIGH (ref 0.44–1.00)
GFR calc Af Amer: 8 mL/min — ABNORMAL LOW (ref >60)
GFR calc non Af Amer: 6 mL/min — ABNORMAL LOW (ref >60)
GLUCOSE: 142 mg/dL — AB (ref 70–99)
POTASSIUM: 4.3 mmol/L (ref 3.5–5.1)
Phosphorus: 4.2 mg/dL (ref 2.5–4.6)
SODIUM: 143 mmol/L (ref 135–145)

## 2018-09-12 MED ORDER — HYDRALAZINE HCL 20 MG/ML IJ SOLN
10.0000 mg | Freq: Four times a day (QID) | INTRAMUSCULAR | Status: DC | PRN
  Administered 2018-09-13: 10 mg via INTRAVENOUS
  Filled 2018-09-12: qty 1

## 2018-09-12 NOTE — Progress Notes (Signed)
[facility]  ROUNDING NOTE   Subjective:   Patient feeding herself.  Alert to self only.   Creatinine 5.22 (4.92) (3.87)  Urine output now being recorded  Objective:  Vital signs in last 24 hours:  Temp:  [97.9 F (36.6 C)-98.9 F (37.2 C)] 97.9 F (36.6 C) (11/03 0733) Pulse Rate:  [70-80] 70 (11/03 0954) Resp:  [16-20] 16 (11/03 0733) BP: (126-174)/(56-81) 159/75 (11/03 0954) SpO2:  [91 %-96 %] 96 % (11/03 0733)  Weight change:  Filed Weights   09/09/18 2145  Weight: 75 kg    Intake/Output: I/O last 3 completed shifts: In: 495.3 [P.O.:120; I.V.:275.3; IV Piggyback:100] Out: 0    Intake/Output this shift:  No intake/output data recorded.  Physical Exam: General: NAD,   Head: Normocephalic, atraumatic. Moist oral mucosal membranes  Eyes: Anicteric, PERRL  Neck: Supple, trachea midline  Lungs:  Clear to auscultation  Heart: Regular rate and rhythm  Abdomen:  Soft, nontender,   Extremities: No  peripheral edema.  Neurologic: Alert to self  Skin: No lesions        Basic Metabolic Panel: Recent Labs  Lab 09/09/18 2236 09/10/18 0614 09/11/18 0537 09/12/18 0456  NA 141 140 140 143  K 4.9 4.8 4.8 4.3  CL 102 103 105 107  CO2 23 25 26 24   GLUCOSE 91 60* 130* 142*  BUN 54* 56* 61* 59*  CREATININE 3.76* 3.82* 4.92* 5.22*  CALCIUM 8.9 8.5* 8.4* 8.6*  PHOS  --   --   --  4.2    Liver Function Tests: Recent Labs  Lab 09/10/18 0614 09/12/18 0456  AST 31  --   ALT 17  --   ALKPHOS 102  --   BILITOT 1.1  --   PROT 6.2*  --   ALBUMIN 3.2* 2.9*   No results for input(s): LIPASE, AMYLASE in the last 168 hours. No results for input(s): AMMONIA in the last 168 hours.  CBC: Recent Labs  Lab 09/09/18 2145 09/10/18 0614 09/11/18 0537 09/12/18 0456  WBC 14.6* 14.2* 10.2 9.4  NEUTROABS 12.4*  --   --   --   HGB 11.3* 10.4* 9.8* 10.4*  HCT 36.0 32.6* 30.4* 32.6*  MCV 96.8 95.9 95.3 95.6  PLT 221 199 177 214    Cardiac Enzymes: No  results for input(s): CKTOTAL, CKMB, CKMBINDEX, TROPONINI in the last 168 hours.  BNP: Invalid input(s): POCBNP  CBG: Recent Labs  Lab 09/11/18 0733 09/11/18 1150 09/11/18 1633 09/11/18 2124 09/12/18 0734  GLUCAP 124* 112* 167* 146* 115*    Microbiology: Results for orders placed or performed during the hospital encounter of 09/09/18  Blood culture (routine x 2)     Status: None (Preliminary result)   Collection Time: 09/10/18 12:13 AM  Result Value Ref Range Status   Specimen Description BLOOD RIGHT ANTECUBITAL  Final   Special Requests   Final    BOTTLES DRAWN AEROBIC AND ANAEROBIC Blood Culture results may not be optimal due to an excessive volume of blood received in culture bottles   Culture   Final    NO GROWTH 2 DAYS Performed at [facility], [address] [place], [place] [address]    Report Status PENDING  Incomplete  Blood culture (routine x 2)     Status: None (Preliminary result)   Collection Time: 09/10/18 12:13 AM  Result Value Ref Range Status   Specimen Description BLOOD LEFT ANTECUBITAL  Final   Special Requests   Final  BOTTLES DRAWN AEROBIC AND ANAEROBIC Blood Culture adequate volume   Culture   Final    NO GROWTH 2 DAYS Performed at [facility], [address] [place], [place] [address]    Report Status PENDING  Incomplete  MRSA PCR Screening     Status: None   Collection Time: 09/10/18  2:03 AM  Result Value Ref Range Status   MRSA by PCR NEGATIVE NEGATIVE Final    Comment:        The GeneXpert MRSA Assay (FDA approved for NASAL specimens only), is one component of a comprehensive MRSA colonization surveillance program. It is not intended to diagnose MRSA infection nor to guide or monitor treatment for MRSA infections. Performed at [facility], [address] [place], [place] [address]     Coagulation Studies: No results for input(s): LABPROT, INR in the last 72 hours.  Urinalysis: No results for  input(s): COLORURINE, LABSPEC, PHURINE, GLUCOSEU, HGBUR, BILIRUBINUR, KETONESUR, PROTEINUR, UROBILINOGEN, NITRITE, LEUKOCYTESUR in the last 72 hours.  Invalid input(s): APPERANCEUR    Imaging: No results found.   Medications:   . sodium chloride 50 mL/hr at 09/11/18 1800  . ceFEPime (MAXIPIME) IV 1 g (09/12/18 0855)   . aspirin EC  81 mg Oral Daily  . atorvastatin  10 mg Oral Q supper  . feeding supplement (ENSURE ENLIVE)  237 mL Oral BID BM  . gabapentin  300 mg Oral Daily  . heparin injection (subcutaneous)  5,000 Units Subcutaneous Q8H  . insulin aspart  0-5 Units Subcutaneous QHS  . insulin aspart  0-9 Units Subcutaneous TID WC  . isosorbide mononitrate  30 mg Oral Daily  . levothyroxine  100 mcg Oral QAC breakfast  . metoprolol tartrate  50 mg Oral BID   acetaminophen **OR** acetaminophen, guaiFENesin-dextromethorphan, ondansetron **OR** ondansetron (ZOFRAN) IV  Assessment/ Plan:  Ms. MAXX CALAWAY is a 84 y.o. black female Jehovah Witness with diabetes mellitus, coronary artery disease status post CABG, history of DVT, hypothyroidism, peripheral vascular disease, hypertension, who was admitted to [facility] on 09/09/2018 for pneumonia  1. Acute renal failure on chronic kidney disease stage IV.  Chronic kidney disease secondary to diabetic nephropathy, obstructive uropathy and hypertension  - Continue IV fluids: NS at 77mL/hr - Discussed prognosis and renal failure. Patient is not a candidate for dialysis. Discussed with patient's family.    LOS: 2 Angelis Gates 11/3/201910:00 AM

## 2018-09-12 NOTE — Progress Notes (Signed)
Pts BP 174/81, scheduled BP meds given,  Recheck 159/75. MD Allena Katz notified.

## 2018-09-12 NOTE — Progress Notes (Signed)
[facility] - [facility]                                                                                                                                                                                  Patient Demographics   [name] [name], is a 84 y.o. female, DOB - [birth year], OZH:[id number]  Admit date - 09/09/2018   Admitting Physician Oralia Manis, MD  Outpatient Primary MD for the patient is Dorothey Baseman, MD   LOS - 2  Subjective: Patient continues to be confused, renal function continues to deteriorate    Review of Systems:   CONSTITUTIONAL: Unable to provide due to confusion   Vitals:   Vitals:   09/11/18 1549 09/12/18 0045 09/12/18 0733 09/12/18 0954  BP: (!) 126/56 (!) 151/64 (!) 174/81 (!) 159/75  Pulse: 80 75 76 70  Resp: 18 20 16    Temp: 98.9 F (37.2 C) 98.8 F (37.1 C) 97.9 F (36.6 C)   TempSrc: Oral  Oral   SpO2: 91% 92% 96%   Weight:      Height:        Wt Readings from Last 3 Encounters:  09/09/18 75 kg  02/03/17 75.3 kg  04/11/16 72.6 kg     Intake/Output Summary (Last 24 hours) at 09/12/2018 1208 Last data filed at 09/12/2018 1023 Gross per 24 hour  Intake 435.32 ml  Output 0 ml  Net 435.32 ml    Physical Exam:   GENERAL: Chronically ill-appearing  HEAD, EYES, EARS, NOSE AND THROAT: Atraumatic, normocephalic. Extraocular muscles are intact. Pupils equal and reactive to light. Sclerae anicteric. No conjunctival injection. No oro-pharyngeal erythema.  NECK: Supple. There is no jugular venous distention. No bruits, no lymphadenopathy, no thyromegaly.  HEART: Regular rate and rhythm,. No murmurs, no rubs, no clicks.  LUNGS: Clear to auscultation bilaterally. No rales or rhonchi. No wheezes.  ABDOMEN: Soft, flat, nontender, nondistended. Has good bowel sounds. No hepatosplenomegaly appreciated.  EXTREMITIES: No evidence of any cyanosis, clubbing, or peripheral edema.  +2 pedal and radial pulses bilaterally.   NEUROLOGIC: Patient is confused  sKIN: Moist and warm with no rashes appreciated.  Psych: Not anxious, depressed LN: No inguinal LN enlargement    Antibiotics   Anti-infectives (From admission, onward)   Start     Dose/Rate Route Frequency Ordered Stop   09/11/18 1000  ceFEPIme (MAXIPIME) 1 g in sodium chloride 0.9 % 100 mL IVPB     1 g 200 mL/hr over 30 Minutes Intravenous Every 24 hours 09/10/18 0148     09/10/18 1000  vancomycin variable dose per unstable renal function (pharmacist dosing)  Status:  Discontinued  Does not apply See admin instructions 09/10/18 1001 09/10/18 1500   09/09/18 2345  vancomycin (VANCOCIN) IVPB 1000 mg/200 mL premix     1,000 mg 200 mL/hr over 60 Minutes Intravenous  Once 09/09/18 2344 09/10/18 0151   09/09/18 2345  ceFEPIme (MAXIPIME) 2 g in sodium chloride 0.9 % 100 mL IVPB     2 g 200 mL/hr over 30 Minutes Intravenous  Once 09/09/18 2344 09/10/18 0046      Medications   Scheduled Meds: . aspirin EC  81 mg Oral Daily  . atorvastatin  10 mg Oral Q supper  . feeding supplement (ENSURE ENLIVE)  237 mL Oral BID BM  . gabapentin  300 mg Oral Daily  . heparin injection (subcutaneous)  5,000 Units Subcutaneous Q8H  . insulin aspart  0-5 Units Subcutaneous QHS  . insulin aspart  0-9 Units Subcutaneous TID WC  . isosorbide mononitrate  30 mg Oral Daily  . levothyroxine  100 mcg Oral QAC breakfast  . metoprolol tartrate  50 mg Oral BID   Continuous Infusions: . sodium chloride 75 mL/hr at 09/12/18 1004  . ceFEPime (MAXIPIME) IV 1 g (09/12/18 0855)   PRN Meds:.acetaminophen **OR** acetaminophen, guaiFENesin-dextromethorphan, hydrALAZINE, ondansetron **OR** ondansetron (ZOFRAN) IV   Data Review:   Micro Results Recent Results (from the past 240 hour(s))  Blood culture (routine x 2)     Status: None (Preliminary result)   Collection Time: 09/10/18 12:13 AM  Result Value Ref Range Status   Specimen Description BLOOD RIGHT ANTECUBITAL  Final    Special Requests   Final    BOTTLES DRAWN AEROBIC AND ANAEROBIC Blood Culture results may not be optimal due to an excessive volume of blood received in culture bottles   Culture   Final    NO GROWTH 2 DAYS Performed at [facility], [address], [place], [place] [address]    Report Status PENDING  Incomplete  Blood culture (routine x 2)     Status: None (Preliminary result)   Collection Time: 09/10/18 12:13 AM  Result Value Ref Range Status   Specimen Description BLOOD LEFT ANTECUBITAL  Final   Special Requests   Final    BOTTLES DRAWN AEROBIC AND ANAEROBIC Blood Culture adequate volume   Culture   Final    NO GROWTH 2 DAYS Performed at [facility], [address], [place], [place] [address]    Report Status PENDING  Incomplete  MRSA PCR Screening     Status: None   Collection Time: 09/10/18  2:03 AM  Result Value Ref Range Status   MRSA by PCR NEGATIVE NEGATIVE Final    Comment:        The GeneXpert MRSA Assay (FDA approved for NASAL specimens only), is one component of a comprehensive MRSA colonization surveillance program. It is not intended to diagnose MRSA infection nor to guide or monitor treatment for MRSA infections. Performed at [facility], [address] [place], [place] [address]     Radiology Reports Dg Chest 2 View  Result Date: 09/09/2018 CLINICAL DATA:  Cough. EXAM: CHEST - 2 VIEW COMPARISON:  07/04/2014 FINDINGS: Lungs are adequately inflated with hazy opacification over the left base likely effusion with atelectasis. There is minimal hazy density over the right base. Infection in the lung bases is possible. Moderate cardiomegaly. Degenerate change of the spine. IMPRESSION: Hazy opacification over the left base worse than the right base likely due to left-sided effusion with bibasilar atelectasis. Infection within the lung bases  is possible. Moderate cardiomegaly. Electronically Signed   By: Elberta Fortis M.D.   On:  09/09/2018 22:52     CBC Recent Labs  Lab 09/09/18 2145 09/10/18 0614 09/11/18 0537 09/12/18 0456  WBC 14.6* 14.2* 10.2 9.4  HGB 11.3* 10.4* 9.8* 10.4*  HCT 36.0 32.6* 30.4* 32.6*  PLT 221 199 177 214  MCV 96.8 95.9 95.3 95.6  MCH 30.4 30.6 30.7 30.5  MCHC 31.4 31.9 32.2 31.9  RDW 13.2 13.3 13.2 13.0  LYMPHSABS 1.5  --   --   --   MONOABS 0.5  --   --   --   EOSABS 0.1  --   --   --   BASOSABS 0.0  --   --   --     Chemistries  Recent Labs  Lab 09/09/18 2236 09/10/18 0614 09/11/18 0537 09/12/18 0456  NA 141 140 140 143  K 4.9 4.8 4.8 4.3  CL 102 103 105 107  CO2 23 25 26 24   GLUCOSE 91 60* 130* 142*  BUN 54* 56* 61* 59*  CREATININE 3.76* 3.82* 4.92* 5.22*  CALCIUM 8.9 8.5* 8.4* 8.6*  AST  --  31  --   --   ALT  --  17  --   --   ALKPHOS  --  102  --   --   BILITOT  --  1.1  --   --    ------------------------------------------------------------------------------------------------------------------ estimated creatinine clearance is 6.7 mL/min (A) (by C-G formula based on SCr of 5.22 mg/dL (H)). ------------------------------------------------------------------------------------------------------------------ No results for input(s): HGBA1C in the last 72 hours. ------------------------------------------------------------------------------------------------------------------ No results for input(s): CHOL, HDL, LDLCALC, TRIG, CHOLHDL, LDLDIRECT in the last 72 hours. ------------------------------------------------------------------------------------------------------------------ No results for input(s): TSH, T4TOTAL, T3FREE, THYROIDAB in the last 72 hours.  Invalid input(s): FREET3 ------------------------------------------------------------------------------------------------------------------ No results for input(s): VITAMINB12, FOLATE, FERRITIN, TIBC, IRON, RETICCTPCT in the last 72 hours.  Coagulation profile No results for input(s): INR, PROTIME in the last  168 hours.  No results for input(s): DDIMER in the last 72 hours.  Cardiac Enzymes No results for input(s): CKMB, TROPONINI, MYOGLOBIN in the last 168 hours.  Invalid input(s): CK ------------------------------------------------------------------------------------------------------------------ Invalid input(s): POCBNP    Assessment & Plan   * HCAP (healthcare-associated pneumonia)  -Continue cefepime + vanco, -Procalcitonin likely very high due to acute renal failure to use this as the monitoring of her infection  *Acute renal failure on chronic kidney disease stage IV.  -Renal function continuing to worsen - as per nephrology family wishes no hemodialysis  *Essential hypertension -continue home meds  *Chronic diastolic CHF (congestive heart failure) (HCC) -continue home medications Coronary artery disease -IV Lasix on hold  Diabetes mellitus type 2 with complications (HCC) -sliding scale insulin with corresponding glucose checks  Hyperlipemia -continue Lipitor  Hypothyroidism -home dose thyroid replacement     Code Status Orders  (From admission, onward)         Start     Ordered   09/10/18 0220  Do not attempt resuscitation (DNR)  Continuous    Question Answer Comment  In the event of cardiac or respiratory ARREST Do not call a "code blue"   In the event of cardiac or respiratory ARREST Do not perform Intubation, CPR, defibrillation or ACLS   In the event of cardiac or respiratory ARREST Use medication by any route, position, wound care, and other measures to relive pain and suffering. May use oxygen, suction and manual treatment of airway obstruction as needed for comfort.  09/10/18 0220        Code Status History    This patient has a current code status but no historical code status.    Advance Directive Documentation     Most Recent Value  Type of Advance Directive  Out of facility DNR (pink MOST or yellow form)  Pre-existing out of  facility DNR order (yellow form or pink MOST form)  Pink MOST form placed in chart (order not valid for inpatient use)  "MOST" Form in Place?  -           Consults nephrology DVT Prophylaxis heparin  Lab Results  Component Value Date   PLT 214 Jun 17, 202019     Time Spent in minutes   35 minutes greater than 50% of time spent in care coordination and counseling patient regarding the condition and plan of care.  Prognosis poor  Auburn Bilberry M.D on 09/12/2018 at 12:08 PM  Between 7am to 6pm - Pager - [phone]  After 6pm go to [email or web address] - Social research officer, government  [employer]   Office  [phone]

## 2018-09-13 LAB — RENAL FUNCTION PANEL
ANION GAP: 10 (ref 5–15)
Albumin: 2.8 g/dL — ABNORMAL LOW (ref 3.5–5.0)
BUN: 53 mg/dL — ABNORMAL HIGH (ref 8–23)
CALCIUM: 8.4 mg/dL — AB (ref 8.9–10.3)
CO2: 24 mmol/L (ref 22–32)
Chloride: 109 mmol/L (ref 98–111)
Creatinine, Ser: 4.04 mg/dL — ABNORMAL HIGH (ref 0.44–1.00)
GFR calc non Af Amer: 9 mL/min — ABNORMAL LOW (ref >60)
GFR, EST AFRICAN AMERICAN: 10 mL/min — AB (ref >60)
Glucose, Bld: 129 mg/dL — ABNORMAL HIGH (ref 70–99)
PHOSPHORUS: 3.6 mg/dL (ref 2.5–4.6)
POTASSIUM: 4.2 mmol/L (ref 3.5–5.1)
Sodium: 143 mmol/L (ref 135–145)

## 2018-09-13 LAB — GLUCOSE, CAPILLARY
GLUCOSE-CAPILLARY: 116 mg/dL — AB (ref 70–99)
GLUCOSE-CAPILLARY: 155 mg/dL — AB (ref 70–99)
Glucose-Capillary: 140 mg/dL — ABNORMAL HIGH (ref 70–99)
Glucose-Capillary: 131 mg/dL — ABNORMAL HIGH (ref 70–99)

## 2018-09-13 MED ORDER — SODIUM CHLORIDE 0.9 % IV SOLN
INTRAVENOUS | Status: DC
  Administered 2018-09-13 – 2018-09-14 (×3): via INTRAVENOUS

## 2018-09-13 MED ORDER — LABETALOL HCL 5 MG/ML IV SOLN: 10.0000 mg | INTRAVENOUS | Status: DC | PRN

## 2018-09-13 MED ORDER — AMOXICILLIN-POT CLAVULANATE 500-125 MG PO TABS
500.0000 mg | ORAL_TABLET | Freq: Every day | ORAL | Status: DC
  Administered 2018-09-14 – 2018-09-15 (×2): 500 mg via ORAL
  Filled 2018-09-13 (×3): qty 1

## 2018-09-13 MED ORDER — HYDRALAZINE HCL 50 MG PO TABS
50.0000 mg | ORAL_TABLET | Freq: Three times a day (TID) | ORAL | Status: DC
  Administered 2018-09-14 – 2018-09-15 (×3): 50 mg via ORAL
  Filled 2018-09-13 (×5): qty 1

## 2018-09-13 MED ORDER — QUETIAPINE FUMARATE 25 MG PO TABS
25.0000 mg | ORAL_TABLET | Freq: Two times a day (BID) | ORAL | Status: DC
  Administered 2018-09-14: 25 mg via ORAL
  Filled 2018-09-13 (×2): qty 1

## 2018-09-13 MED ORDER — HALOPERIDOL LACTATE 5 MG/ML IJ SOLN
2.0000 mg | Freq: Once | INTRAMUSCULAR | Status: AC
  Administered 2018-09-13: 2 mg via INTRAVENOUS
  Filled 2018-09-13: qty 1

## 2018-09-13 MED ORDER — AMOXICILLIN-POT CLAVULANATE 875-125 MG PO TABS
1.0000 | ORAL_TABLET | Freq: Two times a day (BID) | ORAL | Status: DC
  Filled 2018-09-13: qty 1

## 2018-09-13 NOTE — Progress Notes (Signed)
PHARMACY NOTE:  ANTIMICROBIAL RENAL DOSAGE ADJUSTMENT  Current antimicrobial regimen includes a mismatch between antimicrobial dosage and estimated renal function.  As per policy approved by the Pharmacy & Therapeutics and Medical Executive Committees, the antimicrobial dosage will be adjusted accordingly.  Current antimicrobial dosage:  Augmentin 875mg  q12h  Indication: HCAP  Renal Function:  Estimated Creatinine Clearance: 8.6 mL/min (A) (by C-G formula based on SCr of 4.04 mg/dL (H)). []      On intermittent HD, scheduled: []      On CRRT    Antimicrobial dosage has been changed to:  Augmentin 500mg  daily   Thank you for allowing pharmacy to be a part of this patient's care.  Lowella Bandy, [facility] 09/13/2018 3:24 PM

## 2018-09-13 NOTE — Progress Notes (Signed)
[facility]  ROUNDING NOTE   Subjective:   Patient feeding herself.  Alert to self only.  Combative this afternoon.  Did not follow any commands  Serum creatinine 4.04 (down from 5.22) According to nursing staff, patient refused her medications  Objective:  Vital signs in last 24 hours:  Temp:  [98.6 F (37 C)-98.7 F (37.1 C)] 98.6 F (37 C) (11/03 2321) Pulse Rate:  [73-97] 97 (11/04 1300) Resp:  [16-18] 16 (11/03 2321) BP: (146-186)/(64-109) 160/107 (11/04 1300) SpO2:  [95 %-97 %] 97 % (11/04 0814)  Weight change:  Filed Weights   09/09/18 2145  Weight: 75 kg    Intake/Output: I/O last 3 completed shifts: In: 300 [P.O.:300] Out: 1701 [Urine:1700; Stool:1]   Intake/Output this shift:  Total I/O In: 2649.8 [I.V.:2549.8; IV Piggyback:100] Out: 950 [Urine:950]  Physical Exam: General: NAD, chronically ill-appearing, frail  Head: Normocephalic, atraumatic. Moist oral mucosal membranes  Eyes: Anicteric,  Neck: Supple,   Lungs:   Patient refused exam  Heart:  Patient refused exam  Abdomen:   Patient refused exam  Extremities:  Patient refused exam  Neurologic: Alert to self, somewhat agitated           Basic Metabolic Panel: Recent Labs  Lab 09/09/18 2236 09/10/18 0614 09/11/18 0537 09/12/18 0456 09/13/18 0328  NA 141 140 140 143 143  K 4.9 4.8 4.8 4.3 4.2  CL 102 103 105 107 109  CO2 23 25 26 24 24   GLUCOSE 91 60* 130* 142* 129*  BUN 54* 56* 61* 59* 53*  CREATININE 3.76* 3.82* 4.92* 5.22* 4.04*  CALCIUM 8.9 8.5* 8.4* 8.6* 8.4*  PHOS  --   --   --  4.2 3.6    Liver Function Tests: Recent Labs  Lab 09/10/18 0614 09/12/18 0456 09/13/18 0328  AST 31  --   --   ALT 17  --   --   ALKPHOS 102  --   --   BILITOT 1.1  --   --   PROT 6.2*  --   --   ALBUMIN 3.2* 2.9* 2.8*   No results for input(s): LIPASE, AMYLASE in the last 168 hours. No results for input(s): AMMONIA in the last 168 hours.  CBC: Recent Labs  Lab  09/09/18 2145 09/10/18 0614 09/11/18 0537 09/12/18 0456  WBC 14.6* 14.2* 10.2 9.4  NEUTROABS 12.4*  --   --   --   HGB 11.3* 10.4* 9.8* 10.4*  HCT 36.0 32.6* 30.4* 32.6*  MCV 96.8 95.9 95.3 95.6  PLT 221 199 177 214    Cardiac Enzymes: No results for input(s): CKTOTAL, CKMB, CKMBINDEX, TROPONINI in the last 168 hours.  BNP: Invalid input(s): POCBNP  CBG: Recent Labs  Lab 09/12/18 1203 09/12/18 1710 09/12/18 2116 09/13/18 0812 09/13/18 1127  GLUCAP 143* 195* 146* 116* 140*    Microbiology: Results for orders placed or performed during the hospital encounter of 09/09/18  Blood culture (routine x 2)     Status: None (Preliminary result)   Collection Time: 09/10/18 12:13 AM  Result Value Ref Range Status   Specimen Description BLOOD RIGHT ANTECUBITAL  Final   Special Requests   Final    BOTTLES DRAWN AEROBIC AND ANAEROBIC Blood Culture results may not be optimal due to an excessive volume of blood received in culture bottles   Culture   Final    NO GROWTH 3 DAYS Performed at [facility], [address] [place], [place] [address]    Report  Status PENDING  Incomplete  Blood culture (routine x 2)     Status: None (Preliminary result)   Collection Time: 09/10/18 12:13 AM  Result Value Ref Range Status   Specimen Description BLOOD LEFT ANTECUBITAL  Final   Special Requests   Final    BOTTLES DRAWN AEROBIC AND ANAEROBIC Blood Culture adequate volume   Culture   Final    NO GROWTH 3 DAYS Performed at [facility], [address], [place], [place] [address]    Report Status PENDING  Incomplete  MRSA PCR Screening     Status: None   Collection Time: 09/10/18  2:03 AM  Result Value Ref Range Status   MRSA by PCR NEGATIVE NEGATIVE Final    Comment:        The GeneXpert MRSA Assay (FDA approved for NASAL specimens only), is one component of a comprehensive MRSA colonization surveillance program. It is not intended to diagnose MRSA infection nor  to guide or monitor treatment for MRSA infections. Performed at [facility], [address] [place], [place] [address]     Coagulation Studies: No results for input(s): LABPROT, INR in the last 72 hours.  Urinalysis: No results for input(s): COLORURINE, LABSPEC, PHURINE, GLUCOSEU, HGBUR, BILIRUBINUR, KETONESUR, PROTEINUR, UROBILINOGEN, NITRITE, LEUKOCYTESUR in the last 72 hours.  Invalid input(s): APPERANCEUR    Imaging: No results found.   Medications:   . sodium chloride 50 mL/hr at 09/13/18 1404   . amoxicillin-clavulanate  500 mg Oral Daily  . aspirin EC  81 mg Oral Daily  . atorvastatin  10 mg Oral Q supper  . feeding supplement (ENSURE ENLIVE)  237 mL Oral BID BM  . gabapentin  300 mg Oral Daily  . heparin injection (subcutaneous)  5,000 Units Subcutaneous Q8H  . hydrALAZINE  50 mg Oral Q8H  . insulin aspart  0-5 Units Subcutaneous QHS  . insulin aspart  0-9 Units Subcutaneous TID WC  . isosorbide mononitrate  30 mg Oral Daily  . levothyroxine  100 mcg Oral QAC breakfast  . metoprolol tartrate  50 mg Oral BID  . QUEtiapine  25 mg Oral BID   acetaminophen **OR** acetaminophen, guaiFENesin-dextromethorphan, hydrALAZINE, ondansetron **OR** ondansetron (ZOFRAN) IV  Assessment/ Plan:  [name] is a 84 y.o. black female [employer] with diabetes mellitus, coronary artery disease status post CABG, history of DVT, hypothyroidism, peripheral vascular disease, hypertension, who was admitted to [facility] on 09/09/2018 for pneumonia  1. Acute renal failure on chronic kidney disease stage IV.  Chronic kidney disease secondary to diabetic nephropathy, obstructive uropathy and hypertension  - Continue IV fluids: NS at 33mL/hr, since oral intake is still not adequate - poor prognosis  - Patient is not a candidate for dialysis.     LOS: 3 [name] [name] 11/4/20193:25 PM

## 2018-09-13 NOTE — Progress Notes (Signed)
Pt has been oriented to self only this morning. She refused to take most of her morning medications and ones scheduled later. including antibiotic and BP meds. Pt's BP has been reading 160-180s/100s; however, will not keep arm still while BP is being taken. Dr. Allena Katz notified and aware of above- ordered seroquel.

## 2018-09-13 NOTE — Progress Notes (Signed)
[employer] [place] [facility]                                                                                                                                                                                  Patient Demographics   [name] [name], is a 84 y.o. female, DOB - 1925-05-20, ZOX:[id number]  Admit date - 09/09/2018   Admitting Physician Oralia Manis, MD  Outpatient Primary MD for the patient is Dorothey Baseman, MD   LOS - 3  Subjective: Patient is confused, renal function improved today  Review of Systems:   CONSTITUTIONAL: Unable to provide due to confusion   Vitals:   Vitals:   09/12/18 2326 09/13/18 0814 09/13/18 0816 09/13/18 0948  BP: (!) 165/64  (!) 186/86 (!) 185/109  Pulse: 73 88 88 96  Resp:      Temp:      TempSrc:      SpO2:  97%    Weight:      Height:        Wt Readings from Last 3 Encounters:  09/09/18 75 kg  02/03/17 75.3 kg  04/11/16 72.6 kg     Intake/Output Summary (Last 24 hours) at 09/13/2018 1137 Last data filed at 09/13/2018 0949 Gross per 24 hour  Intake 240 ml  Output 2000 ml  Net -1760 ml    Physical Exam:   GENERAL: Chronically ill-appearing  HEAD, EYES, EARS, NOSE AND THROAT: Atraumatic, normocephalic. Extraocular muscles are intact. Pupils equal and reactive to light. Sclerae anicteric. No conjunctival injection. No oro-pharyngeal erythema.  NECK: Supple. There is no jugular venous distention. No bruits, no lymphadenopathy, no thyromegaly.  HEART: Regular rate and rhythm,. No murmurs, no rubs, no clicks.  LUNGS: Clear to auscultation bilaterally. No rales or rhonchi. No wheezes.  ABDOMEN: Soft, flat, nontender, nondistended. Has good bowel sounds. No hepatosplenomegaly appreciated.  EXTREMITIES: No evidence of any cyanosis, clubbing, or peripheral edema.  +2 pedal and radial pulses bilaterally.  NEUROLOGIC: Patient is confused  sKIN: Moist and warm with no rashes appreciated.  Psych: Not anxious,  depressed LN: No inguinal LN enlargement    Antibiotics   Anti-infectives (From admission, onward)   Start     Dose/Rate Route Frequency Ordered Stop   09/11/18 1000  ceFEPIme (MAXIPIME) 1 g in sodium chloride 0.9 % 100 mL IVPB     1 g 200 mL/hr over 30 Minutes Intravenous Every 24 hours 09/10/18 0148     09/10/18 1000  vancomycin variable dose per unstable renal function (pharmacist dosing)  Status:  Discontinued      Does not apply See admin instructions 09/10/18 1001 09/10/18 1500   09/09/18 2345  vancomycin (VANCOCIN) IVPB 1000 mg/200 mL premix     1,000 mg 200 mL/hr over 60 Minutes Intravenous  Once 09/09/18 2344 09/10/18 0151   09/09/18 2345  ceFEPIme (MAXIPIME) 2 g in sodium chloride 0.9 % 100 mL IVPB     2 g 200 mL/hr over 30 Minutes Intravenous  Once 09/09/18 2344 09/10/18 0046      Medications   Scheduled Meds: . aspirin EC  81 mg Oral Daily  . atorvastatin  10 mg Oral Q supper  . feeding supplement (ENSURE ENLIVE)  237 mL Oral BID BM  . gabapentin  300 mg Oral Daily  . heparin injection (subcutaneous)  5,000 Units Subcutaneous Q8H  . hydrALAZINE  50 mg Oral Q8H  . insulin aspart  0-5 Units Subcutaneous QHS  . insulin aspart  0-9 Units Subcutaneous TID WC  . isosorbide mononitrate  30 mg Oral Daily  . levothyroxine  100 mcg Oral QAC breakfast  . metoprolol tartrate  50 mg Oral BID   Continuous Infusions: . ceFEPime (MAXIPIME) IV 1 g (09/13/18 0931)   PRN Meds:.acetaminophen **OR** acetaminophen, guaiFENesin-dextromethorphan, hydrALAZINE, ondansetron **OR** ondansetron (ZOFRAN) IV   Data Review:   Micro Results Recent Results (from the past 240 hour(s))  Blood culture (routine x 2)     Status: None (Preliminary result)   Collection Time: 09/10/18 12:13 AM  Result Value Ref Range Status   Specimen Description BLOOD RIGHT ANTECUBITAL  Final   Special Requests   Final    BOTTLES DRAWN AEROBIC AND ANAEROBIC Blood Culture results may not be optimal due to an  excessive volume of blood received in culture bottles   Culture   Final    NO GROWTH 3 DAYS Performed at [facility], [address], [place], [place] [address]    Report Status PENDING  Incomplete  Blood culture (routine x 2)     Status: None (Preliminary result)   Collection Time: 09/10/18 12:13 AM  Result Value Ref Range Status   Specimen Description BLOOD LEFT ANTECUBITAL  Final   Special Requests   Final    BOTTLES DRAWN AEROBIC AND ANAEROBIC Blood Culture adequate volume   Culture   Final    NO GROWTH 3 DAYS Performed at [facility], [address], [place], [place] [address]    Report Status PENDING  Incomplete  MRSA PCR Screening     Status: None   Collection Time: 09/10/18  2:03 AM  Result Value Ref Range Status   MRSA by PCR NEGATIVE NEGATIVE Final    Comment:        The GeneXpert MRSA Assay (FDA approved for NASAL specimens only), is one component of a comprehensive MRSA colonization surveillance program. It is not intended to diagnose MRSA infection nor to guide or monitor treatment for MRSA infections. Performed at [facility], [address] [place], [place] [address]     Radiology Reports Dg Chest 2 View  Result Date: 09/09/2018 CLINICAL DATA:  Cough. EXAM: CHEST - 2 VIEW COMPARISON:  07/04/2014 FINDINGS: Lungs are adequately inflated with hazy opacification over the left base likely effusion with atelectasis. There is minimal hazy density over the right base. Infection in the lung bases is possible. Moderate cardiomegaly. Degenerate change of the spine. IMPRESSION: Hazy opacification over the left base worse than the right base likely due to left-sided effusion with bibasilar atelectasis. Infection within the lung bases is possible. Moderate cardiomegaly. Electronically Signed   By: Elberta Fortis M.D.   On:  09/09/2018 22:52     CBC Recent Labs  Lab 09/09/18 2145 09/10/18 0614 09/11/18 0537 09/12/18 0456  WBC 14.6*  14.2* 10.2 9.4  HGB 11.3* 10.4* 9.8* 10.4*  HCT 36.0 32.6* 30.4* 32.6*  PLT 221 199 177 214  MCV 96.8 95.9 95.3 95.6  MCH 30.4 30.6 30.7 30.5  MCHC 31.4 31.9 32.2 31.9  RDW 13.2 13.3 13.2 13.0  LYMPHSABS 1.5  --   --   --   MONOABS 0.5  --   --   --   EOSABS 0.1  --   --   --   BASOSABS 0.0  --   --   --     Chemistries  Recent Labs  Lab 09/09/18 2236 09/10/18 0614 09/11/18 0537 09/12/18 0456 09/13/18 0328  NA 141 140 140 143 143  K 4.9 4.8 4.8 4.3 4.2  CL 102 103 105 107 109  CO2 23 25 26 24 24   GLUCOSE 91 60* 130* 142* 129*  BUN 54* 56* 61* 59* 53*  CREATININE 3.76* 3.82* 4.92* 5.22* 4.04*  CALCIUM 8.9 8.5* 8.4* 8.6* 8.4*  AST  --  31  --   --   --   ALT  --  17  --   --   --   ALKPHOS  --  102  --   --   --   BILITOT  --  1.1  --   --   --    ------------------------------------------------------------------------------------------------------------------ estimated creatinine clearance is 8.6 mL/min (A) (by C-G formula based on SCr of 4.04 mg/dL (H)). ------------------------------------------------------------------------------------------------------------------ No results for input(s): HGBA1C in the last 72 hours. ------------------------------------------------------------------------------------------------------------------ No results for input(s): CHOL, HDL, LDLCALC, TRIG, CHOLHDL, LDLDIRECT in the last 72 hours. ------------------------------------------------------------------------------------------------------------------ No results for input(s): TSH, T4TOTAL, T3FREE, THYROIDAB in the last 72 hours.  Invalid input(s): FREET3 ------------------------------------------------------------------------------------------------------------------ No results for input(s): VITAMINB12, FOLATE, FERRITIN, TIBC, IRON, RETICCTPCT in the last 72 hours.  Coagulation profile No results for input(s): INR, PROTIME in the last 168 hours.  No results for input(s): DDIMER in  the last 72 hours.  Cardiac Enzymes No results for input(s): CKMB, TROPONINI, MYOGLOBIN in the last 168 hours.  Invalid input(s): CK ------------------------------------------------------------------------------------------------------------------ Invalid input(s): POCBNP    Assessment & Plan   * HCAP (healthcare-associated pneumonia)  -Change to oral antibiotic   *Acute renal failure on chronic kidney disease stage IV.  -Renal function improving today, continue IV hydration - as per nephrology family wishes no hemodialysis  *Essential hypertension -continue home meds  *Chronic diastolic CHF (congestive heart failure) (HCC) -continue home medications Coronary artery disease -IV Lasix on hold  Diabetes mellitus type 2 with complications (HCC) -sliding scale insulin with corresponding glucose checks  Hyperlipemia -continue Lipitor  Hypothyroidism -home dose thyroid replacement     Code Status Orders  (From admission, onward)         Start     Ordered   09/10/18 0220  Do not attempt resuscitation (DNR)  Continuous    Question Answer Comment  In the event of cardiac or respiratory ARREST Do not call a "code blue"   In the event of cardiac or respiratory ARREST Do not perform Intubation, CPR, defibrillation or ACLS   In the event of cardiac or respiratory ARREST Use medication by any route, position, wound care, and other measures to relive pain and suffering. May use oxygen, suction and manual treatment of airway obstruction as needed for comfort.      09/10/18 0220  Code Status History    This patient has a current code status but no historical code status.    Advance Directive Documentation     Most Recent Value  Type of Advance Directive  Out of facility DNR (pink MOST or yellow form)  Pre-existing out of facility DNR order (yellow form or pink MOST form)  Pink MOST form placed in chart (order not valid for inpatient use)  "MOST" Form in  Place?  -           Consults nephrology DVT Prophylaxis heparin  Lab Results  Component Value Date   PLT 214 2020/01/418     Time Spent in minutes   35 minutes greater than 50% of time spent in care coordination and counseling patient regarding the condition and plan of care.  Prognosis poor  Auburn Bilberry M.D on 09/13/2018 at 11:37 AM  Between 7am to 6pm - Pager - [phone]  After 6pm go to [email or web address] - Social research officer, government  [employer]   Office  [phone]

## 2018-09-14 LAB — GLUCOSE, CAPILLARY
GLUCOSE-CAPILLARY: 169 mg/dL — AB (ref 70–99)
Glucose-Capillary: 210 mg/dL — ABNORMAL HIGH (ref 70–99)
Glucose-Capillary: 136 mg/dL — ABNORMAL HIGH (ref 70–99)
Glucose-Capillary: 182 mg/dL — ABNORMAL HIGH (ref 70–99)

## 2018-09-14 LAB — CBC
HCT: 36.8 % (ref 36.0–46.0)
Hemoglobin: 11.7 g/dL — ABNORMAL LOW (ref 12.0–15.0)
MCH: 30.2 pg (ref 26.0–34.0)
MCHC: 31.8 g/dL (ref 30.0–36.0)
MCV: 94.8 fL (ref 80.0–100.0)
PLATELETS: 273 10*3/uL (ref 150–400)
RBC: 3.88 MIL/uL (ref 3.87–5.11)
RDW: 13.2 % (ref 11.5–15.5)
WBC: 12.3 10*3/uL — ABNORMAL HIGH (ref 4.0–10.5)
nRBC: 0 % (ref 0.0–0.2)

## 2018-09-14 LAB — RENAL FUNCTION PANEL
ALBUMIN: 3.4 g/dL — AB (ref 3.5–5.0)
ANION GAP: 11 (ref 5–15)
BUN: 41 mg/dL — AB (ref 8–23)
CALCIUM: 9.1 mg/dL (ref 8.9–10.3)
CO2: 23 mmol/L (ref 22–32)
Chloride: 111 mmol/L (ref 98–111)
Creatinine, Ser: 2.88 mg/dL — ABNORMAL HIGH (ref 0.44–1.00)
GFR calc Af Amer: 15 mL/min — ABNORMAL LOW (ref >60)
GFR, EST NON AFRICAN AMERICAN: 13 mL/min — AB (ref >60)
GLUCOSE: 169 mg/dL — AB (ref 70–99)
PHOSPHORUS: 3.4 mg/dL (ref 2.5–4.6)
Potassium: 4 mmol/L (ref 3.5–5.1)
SODIUM: 145 mmol/L (ref 135–145)

## 2018-09-14 MED ORDER — QUETIAPINE FUMARATE 25 MG PO TABS
12.5000 mg | ORAL_TABLET | Freq: Two times a day (BID) | ORAL | Status: DC
  Administered 2018-09-14 – 2018-09-15 (×2): 12.5 mg via ORAL
  Filled 2018-09-14 (×2): qty 1

## 2018-09-14 MED ORDER — HEPARIN SODIUM (PORCINE) 5000 UNIT/ML IJ SOLN
5000.0000 [IU] | Freq: Three times a day (TID) | INTRAMUSCULAR | Status: DC
  Administered 2018-09-15: 5000 [IU] via SUBCUTANEOUS
  Filled 2018-09-14 (×2): qty 1

## 2018-09-14 MED ORDER — TRAMADOL HCL 50 MG PO TABS: 50.0000 mg | ORAL_TABLET | Freq: Four times a day (QID) | ORAL | Status: DC | PRN

## 2018-09-14 MED ORDER — ADULT MULTIVITAMIN W/MINERALS CH
1.0000 | ORAL_TABLET | Freq: Every day | ORAL | Status: DC
  Administered 2018-09-15: 1 via ORAL
  Filled 2018-09-14: qty 1

## 2018-09-14 NOTE — Progress Notes (Signed)
Pt being impulsive and combative, MD willis ordered a 1 time dose of Haldol 2 mg IV. I was able to get a more accurate BP of 149/91 after haldol dosage. Will continue to monitor.

## 2018-09-14 NOTE — Progress Notes (Signed)
Nutrition Follow-up  DOCUMENTATION CODES:   Obesity unspecified  INTERVENTION:  Continue Ensure Enlive po BID, each supplement provides 350 kcal and 20 grams of protein.  Provide Magic cup TID with meals, each supplement provides 290 kcal and 9 grams of protein.  Provide daily MVI.  NUTRITION DIAGNOSIS:   Inadequate oral intake related to poor appetite, acute illness(HCAP) as evidenced by per patient/family report.  Ongoing.  GOAL:   Patient will meet greater than or equal to 90% of their needs  Progressing.  MONITOR:   PO intake, Supplement acceptance, Weight trends, Labs  REASON FOR ASSESSMENT:   Malnutrition Screening Tool    ASSESSMENT:   84 year old female who presented to the ED on 10/31 from [facility] with hypoglycemia and SOB. PMH significant for type 2 diabetes mellitus, CHF, CAD, DVT, hyperlipidemia, hypertension, and hypothyroidism. Pt found to have HCAP.  Patient has been confused. Her PO intake remains poor. She is consuming 0-25% of meals. She is drinking 1-2 bottles of Ensure Enlive daily. Patient is not a candidate for HD.   Medications reviewed and include: Augmentin, gabapentin, Novolog 0-9 units TID, Novolog 0-5 units QHS, levothyroxine, Seroquel.  Labs reviewed: CBG 131-210, BUN 41, Creatinine 2.88.  I/O: 1650 mL UOP yesterday (0.9 mL/kg/hr)  No weight since admission to trend.  Diet Order:   Diet Order            Diet Carb Modified Fluid consistency: Thin; Room service appropriate? Yes  Diet effective now              EDUCATION NEEDS:   No education needs have been identified at this time  Skin:  Skin Assessment: Reviewed RN Assessment  Last BM:  09/13/2018 - small type 4  Height:   Ht Readings from Last 1 Encounters:  09/09/18 5\' 2"  (1.575 m)    Weight:   Wt Readings from Last 1 Encounters:  09/09/18 75 kg    Ideal Body Weight:  50 kg  BMI:  Body mass index is 30.24 kg/m.  Estimated Nutritional Needs:    Kcal:  1300-1500  Protein:  70-80 grams  Fluid:  1.3-1.5 L  Helane Rima, MS, RD, LDN Office: [phone] Pager: [phone] After Hours/Weekend Pager: [phone]

## 2018-09-14 NOTE — Progress Notes (Signed)
Per MD patient may D/C back to [facility] tomorrow. [facility] liaison is aware of above.   [employer], LCSW [phone]

## 2018-09-14 NOTE — Progress Notes (Signed)
Pt refuses PO meds. BP reading high, not accurate reading due to pt being combative. MD Anne Hahn made aware.

## 2018-09-14 NOTE — Progress Notes (Signed)
PT Cancellation Note  Patient Details Name: [name] [name] MRN: [id number] DOB: 1925/01/19   Cancelled Treatment:    Reason Eval/Treat Not Completed: Fatigue/lethargy limiting ability to participate(Consult received and chart reviewed.  Patient sleeping soundly upon arrival; awakens briefly to physical touch to LE.  Quickly mumbles (incoherently) and closes eyes.  Unable to maintain alertness for adequate participation with session.  Will continue efforts next date as appropriate and patient able to actively participate.Marland Kitchen)   Merric Yost H. Manson Passey, PT, DPT, NCS 09/14/18, 2:49 PM [phone]

## 2018-09-14 NOTE — Progress Notes (Signed)
[employer] [facility] - [place] at [facility]                                                                                                                                                                                  Patient Demographics   [name] [name], is a 84 y.o. female, DOB - December 12, 1924, ZOX:[id number]  Admit date - 09/09/2018   Admitting Physician Oralia Manis, MD  Outpatient Primary MD for the patient is Dorothey Baseman, MD   LOS - 4  Subjective: She is still somewhat confused but more better today taking her medications etc.  Her at bedside  Review of Systems:   CONSTITUTIONAL: Unable to provide due to confusion   Vitals:   Vitals:   09/13/18 2051 09/13/18 2053 09/13/18 2203 09/14/18 0842  BP: (!) 195/166 (!) 164/143 (!) 149/91 (!) 181/93  Pulse: 97 83 (!) 110 (!) 102  Resp:    18  Temp: 98.6 F (37 C)     TempSrc: Oral     SpO2:    93%  Weight:      Height:        Wt Readings from Last 3 Encounters:  09/09/18 75 kg  02/03/17 75.3 kg  04/11/16 72.6 kg     Intake/Output Summary (Last 24 hours) at 09/14/2018 1212 Last data filed at 09/14/2018 0859 Gross per 24 hour  Intake -  Output 2050 ml  Net -2050 ml    Physical Exam:   GENERAL: Chronically ill-appearing  HEAD, EYES, EARS, NOSE AND THROAT: Atraumatic, normocephalic. Extraocular muscles are intact. Pupils equal and reactive to light. Sclerae anicteric. No conjunctival injection. No oro-pharyngeal erythema.  NECK: Supple. There is no jugular venous distention. No bruits, no lymphadenopathy, no thyromegaly.  HEART: Regular rate and rhythm,. No murmurs, no rubs, no clicks.  LUNGS: Clear to auscultation bilaterally. No rales or rhonchi. No wheezes.  ABDOMEN: Soft, flat, nontender, nondistended. Has good bowel sounds. No hepatosplenomegaly appreciated.  EXTREMITIES: No evidence of any cyanosis, clubbing, or peripheral edema.  +2 pedal and radial pulses bilaterally.  NEUROLOGIC: Confusion  improved sKIN: Moist and warm with no rashes appreciated.  Psych: Not anxious, depressed LN: No inguinal LN enlargement    Antibiotics   Anti-infectives (From admission, onward)   Start     Dose/Rate Route Frequency Ordered Stop   09/13/18 1800  amoxicillin-clavulanate (AUGMENTIN) 500-125 MG per tablet 500 mg     500 mg Oral Daily 09/13/18 1523     09/13/18 1145  amoxicillin-clavulanate (AUGMENTIN) 875-125 MG per tablet 1 tablet  Status:  Discontinued     1 tablet Oral Every 12 hours 09/13/18 1143 09/13/18 1523   09/11/18 1000  ceFEPIme (MAXIPIME) 1 g in sodium chloride 0.9 % 100 mL IVPB  Status:  Discontinued     1 g 200 mL/hr over 30 Minutes Intravenous Every 24 hours 09/10/18 0148 09/13/18 1142   09/10/18 1000  vancomycin variable dose per unstable renal function (pharmacist dosing)  Status:  Discontinued      Does not apply See admin instructions 09/10/18 1001 09/10/18 1500   09/09/18 2345  vancomycin (VANCOCIN) IVPB 1000 mg/200 mL premix     1,000 mg 200 mL/hr over 60 Minutes Intravenous  Once 09/09/18 2344 09/10/18 0151   09/09/18 2345  ceFEPIme (MAXIPIME) 2 g in sodium chloride 0.9 % 100 mL IVPB     2 g 200 mL/hr over 30 Minutes Intravenous  Once 09/09/18 2344 09/10/18 0046      Medications   Scheduled Meds: . amoxicillin-clavulanate  500 mg Oral Daily  . aspirin EC  81 mg Oral Daily  . atorvastatin  10 mg Oral Q supper  . feeding supplement (ENSURE ENLIVE)  237 mL Oral BID BM  . gabapentin  300 mg Oral Daily  . heparin injection (subcutaneous)  5,000 Units Subcutaneous Q8H  . hydrALAZINE  50 mg Oral Q8H  . insulin aspart  0-5 Units Subcutaneous QHS  . insulin aspart  0-9 Units Subcutaneous TID WC  . isosorbide mononitrate  30 mg Oral Daily  . levothyroxine  100 mcg Oral QAC breakfast  . metoprolol tartrate  50 mg Oral BID  . QUEtiapine  12.5 mg Oral BID   Continuous Infusions: . sodium chloride 50 mL/hr at 09/13/18 2248   PRN Meds:.acetaminophen **OR**  acetaminophen, guaiFENesin-dextromethorphan, hydrALAZINE, labetalol, ondansetron **OR** ondansetron (ZOFRAN) IV   Data Review:   Micro Results Recent Results (from the past 240 hour(s))  Blood culture (routine x 2)     Status: None (Preliminary result)   Collection Time: 09/10/18 12:13 AM  Result Value Ref Range Status   Specimen Description BLOOD RIGHT ANTECUBITAL  Final   Special Requests   Final    BOTTLES DRAWN AEROBIC AND ANAEROBIC Blood Culture results may not be optimal due to an excessive volume of blood received in culture bottles   Culture   Final    NO GROWTH 4 DAYS Performed at [facility], [address] [place], [place] [address]    Report Status PENDING  Incomplete  Blood culture (routine x 2)     Status: None (Preliminary result)   Collection Time: 09/10/18 12:13 AM  Result Value Ref Range Status   Specimen Description BLOOD LEFT ANTECUBITAL  Final   Special Requests   Final    BOTTLES DRAWN AEROBIC AND ANAEROBIC Blood Culture adequate volume   Culture   Final    NO GROWTH 4 DAYS Performed at [facility], [address], [place], [place] [address]    Report Status PENDING  Incomplete  MRSA PCR Screening     Status: None   Collection Time: 09/10/18  2:03 AM  Result Value Ref Range Status   MRSA by PCR NEGATIVE NEGATIVE Final    Comment:        The GeneXpert MRSA Assay (FDA approved for NASAL specimens only), is one component of a comprehensive MRSA colonization surveillance program. It is not intended to diagnose MRSA infection nor to guide or monitor treatment for MRSA infections. Performed at [facility], [address], [place], [place] [address]     Radiology Reports Dg Chest 2 View  Result Date: 09/09/2018 CLINICAL DATA:  Cough. EXAM: CHEST - 2 VIEW COMPARISON:  07/04/2014 FINDINGS: Lungs are adequately inflated with hazy opacification over the left base likely effusion with atelectasis. There is minimal hazy  density over the right base. Infection in the lung bases is possible. Moderate cardiomegaly. Degenerate change of the spine. IMPRESSION: Hazy opacification over the left base worse than the right base likely due to left-sided effusion with bibasilar atelectasis. Infection within the lung bases is possible. Moderate cardiomegaly. Electronically Signed   By: Elberta Fortis M.D.   On: 09/09/2018 22:52     CBC Recent Labs  Lab 09/09/18 2145 09/10/18 0614 09/11/18 0537 09/12/18 0456 09/14/18 0656  WBC 14.6* 14.2* 10.2 9.4 12.3*  HGB 11.3* 10.4* 9.8* 10.4* 11.7*  HCT 36.0 32.6* 30.4* 32.6* 36.8  PLT 221 199 177 214 273  MCV 96.8 95.9 95.3 95.6 94.8  MCH 30.4 30.6 30.7 30.5 30.2  MCHC 31.4 31.9 32.2 31.9 31.8  RDW 13.2 13.3 13.2 13.0 13.2  LYMPHSABS 1.5  --   --   --   --   MONOABS 0.5  --   --   --   --   EOSABS 0.1  --   --   --   --   BASOSABS 0.0  --   --   --   --     Chemistries  Recent Labs  Lab 09/10/18 0614 09/11/18 0537 09/12/18 0456 09/13/18 0328 09/14/18 0656  NA 140 140 143 143 145  K 4.8 4.8 4.3 4.2 4.0  CL 103 105 107 109 111  CO2 25 26 24 24 23   GLUCOSE 60* 130* 142* 129* 169*  BUN 56* 61* 59* 53* 41*  CREATININE 3.82* 4.92* 5.22* 4.04* 2.88*  CALCIUM 8.5* 8.4* 8.6* 8.4* 9.1  AST 31  --   --   --   --   ALT 17  --   --   --   --   ALKPHOS 102  --   --   --   --   BILITOT 1.1  --   --   --   --    ------------------------------------------------------------------------------------------------------------------ estimated creatinine clearance is 12.1 mL/min (A) (by C-G formula based on SCr of 2.88 mg/dL (H)). ------------------------------------------------------------------------------------------------------------------ No results for input(s): HGBA1C in the last 72 hours. ------------------------------------------------------------------------------------------------------------------ No results for input(s): CHOL, HDL, LDLCALC, TRIG, CHOLHDL, LDLDIRECT in  the last 72 hours. ------------------------------------------------------------------------------------------------------------------ No results for input(s): TSH, T4TOTAL, T3FREE, THYROIDAB in the last 72 hours.  Invalid input(s): FREET3 ------------------------------------------------------------------------------------------------------------------ No results for input(s): VITAMINB12, FOLATE, FERRITIN, TIBC, IRON, RETICCTPCT in the last 72 hours.  Coagulation profile No results for input(s): INR, PROTIME in the last 168 hours.  No results for input(s): DDIMER in the last 72 hours.  Cardiac Enzymes No results for input(s): CKMB, TROPONINI, MYOGLOBIN in the last 168 hours.  Invalid input(s): CK ------------------------------------------------------------------------------------------------------------------ Invalid input(s): POCBNP    Assessment & Plan   * HCAP (healthcare-associated pneumonia)  -Continue oral antibiotic   *Acute renal failure on chronic kidney disease stage IV.  -Renal function improved continue to follow - as per nephrology family wishes no hemodialysis  *Essential hypertension -continue home meds  *Chronic diastolic CHF (congestive heart failure) (HCC) -continue home medications Coronary artery disease -IV Lasix on hold  Diabetes mellitus type 2 with complications (HCC) -sliding scale insulin with corresponding glucose checks  Hyperlipemia -continue Lipitor  Hypothyroidism -home dose thyroid replacement     Code Status Orders  (From admission, onward)  Start     Ordered   09/10/18 0220  Do not attempt resuscitation (DNR)  Continuous    Question Answer Comment  In the event of cardiac or respiratory ARREST Do not call a "code blue"   In the event of cardiac or respiratory ARREST Do not perform Intubation, CPR, defibrillation or ACLS   In the event of cardiac or respiratory ARREST Use medication by any route, position, wound  care, and other measures to relive pain and suffering. May use oxygen, suction and manual treatment of airway obstruction as needed for comfort.      09/10/18 0220        Code Status History    This patient has a current code status but no historical code status.    Advance Directive Documentation     Most Recent Value  Type of Advance Directive  Out of facility DNR (pink MOST or yellow form)  Pre-existing out of facility DNR order (yellow form or pink MOST form)  Pink MOST form placed in chart (order not valid for inpatient use)  "MOST" Form in Place?  -       Possible discharge tomorrow    Consults nephrology DVT Prophylaxis heparin  Lab Results  Component Value Date   PLT 273 09/14/2018     Time Spent in minutes   35 minutes greater than 50% of time spent in care coordination and counseling patient regarding the condition and plan of care.  Prognosis poor  Auburn Bilberry M.D on 09/14/2018 at 12:12 PM  Between 7am to 6pm - Pager - [phone]  After 6pm go to [email or web address] - Social research officer, government  [employer]   Office  [phone]

## 2018-09-14 NOTE — Care Management Important Message (Signed)
Important Message  Patient Details  Name: [name] [name] MRN: [id number] Date of Birth: Nov 21, 1924   Medicare Important Message Given:  Yes    Olegario Messier A Dalayza Zambrana 09/14/2018, 10:36 AM

## 2018-09-15 LAB — GLUCOSE, CAPILLARY
Glucose-Capillary: 163 mg/dL — ABNORMAL HIGH (ref 70–99)
Glucose-Capillary: 167 mg/dL — ABNORMAL HIGH (ref 70–99)

## 2018-09-15 LAB — RENAL FUNCTION PANEL
Albumin: 2.9 g/dL — ABNORMAL LOW (ref 3.5–5.0)
Anion gap: 10 (ref 5–15)
BUN: 44 mg/dL — ABNORMAL HIGH (ref 8–23)
CHLORIDE: 112 mmol/L — AB (ref 98–111)
CO2: 23 mmol/L (ref 22–32)
CREATININE: 2.55 mg/dL — AB (ref 0.44–1.00)
Calcium: 8.9 mg/dL (ref 8.9–10.3)
GFR, EST AFRICAN AMERICAN: 18 mL/min — AB (ref >60)
GFR, EST NON AFRICAN AMERICAN: 15 mL/min — AB (ref >60)
Glucose, Bld: 166 mg/dL — ABNORMAL HIGH (ref 70–99)
Phosphorus: 3 mg/dL (ref 2.5–4.6)
Potassium: 4.2 mmol/L (ref 3.5–5.1)
Sodium: 145 mmol/L (ref 135–145)

## 2018-09-15 LAB — CULTURE, BLOOD (ROUTINE X 2)
Culture: NO GROWTH
Culture: NO GROWTH
SPECIAL REQUESTS: ADEQUATE

## 2018-09-15 MED ORDER — AMOXICILLIN-POT CLAVULANATE 500-125 MG PO TABS: 500.0000 mg | ORAL_TABLET | Freq: Every day | ORAL | 0 refills | Status: AC

## 2018-09-15 NOTE — Progress Notes (Signed)
Patient is medically stable for D/C back to [facility] today. Per Inetta Fermo [facility] liaison patient can come today to room 404-B. RN will call report and arrange EMS for transport. Clinical Child psychotherapist (CSW) sent D/C orders to [facility] via [facility] today. CSW contacted patient's daughter [name] [name] and made her aware of above. Please reconsult if future social work needs arise. CSW signing off.   [employer], LCSW [phone]

## 2018-09-15 NOTE — Plan of Care (Signed)
Patient IV removed.  RN assessment and VS revealed stability for DC to [facility].  Report called and s/w Modesta Messing, LPN. Discharge papers printed and placed in packet - along with signed [facility] DNR.  EMS contacted to transport to room 404B. Waiting on arrival.

## 2018-09-15 NOTE — Discharge Summary (Signed)
[facility] - [place] at [facility], [place] y.o., DOB April 17, 1925, MRN [id number]. Admission date: 09/09/2018 Discharge Date 09/15/2018 Primary MD Dorothey Baseman, MD Admitting Physician Oralia Manis, MD  Admission Diagnosis  Hypoglycemia [E16.2] Hypoxia [R09.02] HCAP (healthcare-associated pneumonia) [J18.9]  Discharge Diagnosis   Principal Problem: H CAP Acute renal failure on chronic kidney disease stage IV Essential hypertension Chronic diastolic CHF Diabetes type 2 Hyperlipidemia Hypothyroidism  Hospital Course  [name] [name]  is a 84 y.o. female who presents with chief complaint as above.  Patient presented from nursing facility due to shortness of breath.  Patient was noted to have pneumonia as well as acute renal failure on chronic kidney disease.  Patient was given IV hydration with significant improvement in her renal function.  We strongly recommend patient drink plenty of fluids at the facility.  Also had pneumonia and that has improved.  She is not coughing or having any other difficulties.  Patient has had some confusion related to delirium.  Currently stable.           Consults  None  Significant Tests:  See full reports for all details    Dg Chest 2 View  Result Date: 09/09/2018 CLINICAL DATA:  Cough. EXAM: CHEST - 2 VIEW COMPARISON:  07/04/2014 FINDINGS: Lungs are adequately inflated with hazy opacification over the left base likely effusion with atelectasis. There is minimal hazy density over the right base. Infection in the lung bases is possible. Moderate cardiomegaly. Degenerate change of the spine. IMPRESSION: Hazy opacification over the left base worse than the right base likely due to left-sided effusion with bibasilar atelectasis. Infection within the lung bases is possible. Moderate cardiomegaly. Electronically Signed   By: Elberta Fortis M.D.   On: 09/09/2018 22:52       Today   Subjective:   [name] [name]  patient denies any symptoms currently back to baseline Objective:   Blood pressure (!) 156/76, pulse (!) 108, temperature 97.9 F (36.6 C), temperature source Oral, resp. rate (!) 21, height 5\' 2"  (1.575 m), weight 75 kg, SpO2 96 %.  .  Intake/Output Summary (Last 24 hours) at 09/15/2018 1057 Last data filed at 09/15/2018 0028 Gross per 24 hour  Intake 2067.24 ml  Output -  Net 2067.24 ml    Exam VITAL SIGNS: Blood pressure (!) 156/76, pulse (!) 108, temperature 97.9 F (36.6 C), temperature source Oral, resp. rate (!) 21, height 5\' 2"  (1.575 m), weight 75 kg, SpO2 96 %.  GENERAL:  84 y.o.-year-old patient lying in the bed with no acute distress.  EYES: Pupils equal, round, reactive to light and accommodation. No scleral icterus. Extraocular muscles intact.  HEENT: Head atraumatic, normocephalic. Oropharynx and nasopharynx clear.  NECK:  Supple, no jugular venous distention. No thyroid enlargement, no tenderness.  LUNGS: Normal breath sounds bilaterally, no wheezing, rales,rhonchi or crepitation. No use of accessory muscles of respiration.  CARDIOVASCULAR: S1, S2 normal. No murmurs, rubs, or gallops.  ABDOMEN: Soft, nontender, nondistended. Bowel sounds present. No organomegaly or mass.  EXTREMITIES: No pedal edema, cyanosis, or clubbing.  NEUROLOGIC: Cranial nerves II through XII are intact. Muscle strength 5/5 in all extremities. Sensation intact. Gait not checked.  PSYCHIATRIC: The patient is alert not oriented  sKIN: No obvious rash, lesion, or ulcer.   Data Review     CBC w Diff:  Lab Results  Component Value Date   WBC 12.3 (H) 09/14/2018   HGB 11.7 (L) 09/14/2018   HGB 7.7 (L) 07/16/2014  HCT 36.8 09/14/2018   HCT 24.3 (L) 07/16/2014   PLT 273 09/14/2018   PLT 370 07/16/2014   LYMPHOPCT 10 09/09/2018   LYMPHOPCT 7.7 07/16/2014   MONOPCT 4 09/09/2018   MONOPCT 10.7 07/16/2014   EOSPCT 0 09/09/2018   EOSPCT 0.1 07/16/2014   BASOPCT 0 09/09/2018   BASOPCT 0.5  07/16/2014   CMP:  Lab Results  Component Value Date   NA 145 09/15/2018   NA 142 07/16/2014   K 4.2 09/15/2018   K 4.2 07/16/2014   CL 112 (H) 09/15/2018   CL 101 07/16/2014   CO2 23 09/15/2018   CO2 31 07/16/2014   BUN 44 (H) 09/15/2018   BUN 33 (H) 07/16/2014   CREATININE 2.55 (H) 09/15/2018   CREATININE 2.15 (H) 07/16/2014   GLU 277 05/23/2014   PROT 6.2 (L) 09/10/2018   PROT 6.2 04/11/2016   PROT 5.4 (L) 07/16/2014   ALBUMIN 2.9 (L) 09/15/2018   ALBUMIN 3.7 04/11/2016   ALBUMIN 2.2 (L) 07/16/2014   BILITOT 1.1 09/10/2018   BILITOT 0.5 04/11/2016   BILITOT 0.7 07/16/2014   ALKPHOS 102 09/10/2018   ALKPHOS 144 (H) 07/16/2014   AST 31 09/10/2018   AST 23 07/16/2014   ALT 17 09/10/2018   ALT 10 (L) 07/16/2014  .  Micro Results Recent Results (from the past 240 hour(s))  Blood culture (routine x 2)     Status: None   Collection Time: 09/10/18 12:13 AM  Result Value Ref Range Status   Specimen Description BLOOD RIGHT ANTECUBITAL  Final   Special Requests   Final    BOTTLES DRAWN AEROBIC AND ANAEROBIC Blood Culture results may not be optimal due to an excessive volume of blood received in culture bottles   Culture   Final    NO GROWTH 5 DAYS Performed at [facility], [address], [place], [place] [address]    Report Status 09/15/2018 FINAL  Final  Blood culture (routine x 2)     Status: None   Collection Time: 09/10/18 12:13 AM  Result Value Ref Range Status   Specimen Description BLOOD LEFT ANTECUBITAL  Final   Special Requests   Final    BOTTLES DRAWN AEROBIC AND ANAEROBIC Blood Culture adequate volume   Culture   Final    NO GROWTH 5 DAYS Performed at [facility], [address], [place], [place] [address]    Report Status 09/15/2018 FINAL  Final  MRSA PCR Screening     Status: None   Collection Time: 09/10/18  2:03 AM  Result Value Ref Range Status   MRSA by PCR NEGATIVE NEGATIVE Final    Comment:        The GeneXpert MRSA  Assay (FDA approved for NASAL specimens only), is one component of a comprehensive MRSA colonization surveillance program. It is not intended to diagnose MRSA infection nor to guide or monitor treatment for MRSA infections. Performed at [facility], [address] [place], [place] [address]         Code Status Orders  (From admission, onward)         Start     Ordered   09/10/18 0220  Do not attempt resuscitation (DNR)  Continuous    Question Answer Comment  In the event of cardiac or respiratory ARREST Do not call a "code blue"   In the event of cardiac or respiratory ARREST Do not perform Intubation, CPR, defibrillation or ACLS   In the event of  cardiac or respiratory ARREST Use medication by any route, position, wound care, and other measures to relive pain and suffering. May use oxygen, suction and manual treatment of airway obstruction as needed for comfort.      09/10/18 0220        Code Status History    This patient has a current code status but no historical code status.    Advance Directive Documentation     Most Recent Value  Type of Advance Directive  Out of facility DNR (pink MOST or yellow form)  Pre-existing out of facility DNR order (yellow form or pink MOST form)  Pink MOST form placed in chart (order not valid for inpatient use)  "MOST" Form in Place?  -           Contact information for follow-up providers    Dorothey Baseman, MD Follow up in 1 week(s).   Specialty:  Family Medicine Contact information: [address] [place] [address] [phone]            Contact information for after-discharge care    Destination    [facility] Preferred SNF .   Service:  Skilled Nursing Contact information: [address] [place] [address] [phone]                  Discharge Medications   Allergies as of 09/15/2018      Reactions   Acetaminophen    Upset stomach   Codeine        Medication List    STOP taking these medications   furosemide 20 MG tablet Commonly known as:  LASIX   linagliptin 5 MG Tabs tablet Commonly known as:  TRADJENTA   nitrofurantoin (macrocrystal-monohydrate) 100 MG capsule Commonly known as:  MACROBID     TAKE these medications   allopurinol 100 MG tablet Commonly known as:  ZYLOPRIM Take 100 mg by mouth daily.   amoxicillin-clavulanate 500-125 MG tablet Commonly known as:  AUGMENTIN Take 1 tablet (500 mg total) by mouth daily for 5 days. Start taking on:  09/16/2018   aspirin EC 81 MG tablet Take 81 mg by mouth daily.   atorvastatin 10 MG tablet Commonly known as:  LIPITOR Take 10 mg by mouth daily with supper.   atorvastatin 40 MG tablet Commonly known as:  LIPITOR Take 1 tablet (40 mg total) by mouth daily.   feeding supplement (PRO-STAT SUGAR FREE 64) Liqd Take 30 mLs by mouth 2 (two) times daily.   gabapentin 300 MG capsule Commonly known as:  NEURONTIN Take 300 mg by mouth 3 (three) times daily.   iron polysaccharides 150 MG capsule Commonly known as:  NIFEREX Take 150 mg by mouth daily.   isosorbide mononitrate 30 MG 24 hr tablet Commonly known as:  IMDUR TAKE ONE TABLET BY MOUTH EVERY DAY What changed:    how much to take  how to take this  when to take this  additional instructions   LEVEMIR 100 UNIT/ML injection Generic drug:  insulin detemir Inject 38 Units into the skin daily.   levothyroxine 100 MCG tablet Commonly known as:  SYNTHROID, LEVOTHROID Take 100 mcg by mouth daily before breakfast.   losartan 100 MG tablet Commonly known as:  COZAAR Take 100 mg by mouth daily.   metFORMIN 750 MG 24 hr tablet Commonly known as:  GLUCOPHAGE-XR Take 750 mg by mouth 2 (two) times daily.   metoprolol tartrate 50 MG tablet Commonly known as:  LOPRESSOR  Take 50 mg by mouth 2 (two) times daily.   NOVOLOG 100 UNIT/ML injection Generic drug:  insulin aspart Inject 0-10 Units into the skin  4 (four) times daily -  before meals and at bedtime. If blood sugar is less than 60, call MD If blood sugar is 201-250, give 2 units If blood sugar is 251-300, give 4 units If blood sugar is 301-350, give 6 units If blood sugar is 351-400, give 8 units If blood sugar is 401-450, give 10 units If blood sugar is >450, call MD   polyethylene glycol packet Commonly known as:  MIRALAX / GLYCOLAX Take 17 g by mouth daily at 6 PM.   vitamin B-12 1000 MCG tablet Commonly known as:  CYANOCOBALAMIN Take 1,000 mcg by mouth daily.          Total Time in preparing paper work, data evaluation and todays exam - 35 minutes  Auburn Bilberry M.D on 09/15/2018 at 10:57 AM [employer]   Office  [phone]

## 2019-02-25 ENCOUNTER — Telehealth: Payer: Self-pay | Admitting: Cardiovascular Disease

## 2019-02-25 ENCOUNTER — Telehealth: Payer: Self-pay

## 2019-02-25 NOTE — Telephone Encounter (Signed)
Virtual Visit Pre-Appointment Phone Call  Steps For Call:  1. Confirm consent - "In the setting of the current Covid19 crisis, you are scheduled for a (phone or video) visit with your provider on (date) at (time).  Just as we do with many in-office visits, in order for you to participate in this visit, we must obtain consent.  If you'd like, I can send this to your mychart (if signed up) or email for you to review.  Otherwise, I can obtain your verbal consent now.  All virtual visits are billed to your insurance company just like a normal visit would be.  By agreeing to a virtual visit, we'd like you to understand that the technology does not allow for your provider to perform an examination, and thus may limit your provider's ability to fully assess your condition. If your provider identifies any concerns that need to be evaluated in person, we will make arrangements to do so.  Finally, though the technology is pretty good, we cannot assure that it will always work on either your or our end, and in the setting of a video visit, we may have to convert it to a phone-only visit.  In either situation, we cannot ensure that we have a secure connection.  Are you willing to proceed?" STAFF: Did the patient verbally acknowledge consent to telehealth visit? Document YES/NO here: Yes   2. Confirm the BEST phone number to call the day of the visit by including in appointment notes  3. Give patient instructions for WebEx/MyChart download to smartphone as below or Doximity/Doxy.me if video visit (depending on what platform provider is using)  4. Advise patient to be prepared with their blood pressure, heart rate, weight, any heart rhythm information, their current medicines, and a piece of paper and pen handy for any instructions they may receive the day of their visit  5. Inform patient they will receive a phone call 15 minutes prior to their appointment time (may be from unknown caller ID) so they should be  prepared to answer  6. Confirm that appointment type is correct in Epic appointment notes (VIDEO vs PHONE)     TELEPHONE CALL NOTE  [name] [name] has been deemed a candidate for a follow-up tele-health visit to limit community exposure during the Covid-19 pandemic. I spoke with the patient via phone to ensure availability of phone/video source, confirm preferred email & phone number, and discuss instructions and expectations.  I reminded [name] [name] to be prepared with any vital sign and/or heart rhythm information that could potentially be obtained via home monitoring, at the time of her visit. I reminded [name] [name] to expect a phone call at the time of her visit if her visit.  [name] [name] 02/25/2019 3:12 PM   INSTRUCTIONS FOR DOWNLOADING THE WEBEX APP TO SMARTPHONE  - If Apple, ask patient to go to App Store and type in WebEx in the search bar. Download [employer], the blue/green circle. If Android, go to [employer] and type in [employer] in the search bar. The app is free but as with any other app downloads, their phone may require them to verify saved payment information or Apple/Android password.  - The patient does NOT have to create an account. - On the day of the visit, the assist will walk the patient through joining the meeting with the meeting number/password.  INSTRUCTIONS FOR DOWNLOADING THE MYCHART APP TO SMARTPHONE  - The patient must first make  sure to have activated MyChart and know their login information - If Apple, go to [employer] and type in MyChart in the search bar and download the app. If Android, ask patient to go to [employer] and type in [place] in the search bar and download the app. The app is free but as with any other app downloads, their phone may require them to verify saved payment information or Apple/Android password.  - The patient will need to then log into the app with their MyChart username and password, and  select [facility] as their healthcare provider to link the account. When it is time for your visit, go to the MyChart app, find appointments, and click Begin Video Visit. Be sure to Select Allow for your device to access the Microphone and Camera for your visit. You will then be connected, and your provider will be with you shortly.  **If they have any issues connecting, or need assistance please contact MyChart service desk [phone])**  **If using a computer, in order to ensure the best quality for their visit they will need to use either of the following Internet Browsers: [employer], or Google Chrome**  IF USING DOXIMITY or DOXY.ME - The patient will receive a link just prior to their visit, either by text or email (to be determined day of appointment depending on if it's doxy.me or Doximity).     FULL LENGTH CONSENT FOR TELE-HEALTH VISIT   I hereby voluntarily request, consent and authorize [facility] and its employed or contracted physicians, physician assistants, nurse practitioners or other licensed health care professionals (the Practitioner), to provide me with telemedicine health care services (the Services") as deemed necessary by the treating Practitioner. I acknowledge and consent to receive the Services by the Practitioner via telemedicine. I understand that the telemedicine visit will involve communicating with the Practitioner through live audiovisual communication technology and the disclosure of certain medical information by electronic transmission. I acknowledge that I have been given the opportunity to request an in-person assessment or other available alternative prior to the telemedicine visit and am voluntarily participating in the telemedicine visit.  I understand that I have the right to withhold or withdraw my consent to the use of telemedicine in the course of my care at any time, without affecting my right to future care or treatment, and that  the Practitioner or I may terminate the telemedicine visit at any time. I understand that I have the right to inspect all information obtained and/or recorded in the course of the telemedicine visit and may receive copies of available information for a reasonable fee.  I understand that some of the potential risks of receiving the Services via telemedicine include:   Delay or interruption in medical evaluation due to technological equipment failure or disruption;  Information transmitted may not be sufficient (e.g. poor resolution of images) to allow for appropriate medical decision making by the Practitioner; and/or   In rare instances, security protocols could fail, causing a breach of personal health information.  Furthermore, I acknowledge that it is my responsibility to provide information about my medical history, conditions and care that is complete and accurate to the best of my ability. I acknowledge that Practitioner's advice, recommendations, and/or decision may be based on factors not within their control, such as incomplete or inaccurate data provided by me or distortions of diagnostic images or specimens that may result from electronic transmissions. I understand that the practice of medicine is not an exact  science and that Practitioner makes no warranties or guarantees regarding treatment outcomes. I acknowledge that I will receive a copy of this consent concurrently upon execution via email to the email address I last provided but may also request a printed copy by calling the office of [facility].    I understand that my insurance will be billed for this visit.   I have read or had this consent read to me.  I understand the contents of this consent, which adequately explains the benefits and risks of the Services being provided via telemedicine.   I have been provided ample opportunity to ask questions regarding this consent and the Services and have had my questions answered to  my satisfaction.  I give my informed consent for the services to be provided through the use of telemedicine in my medical care  By participating in this telemedicine visit I agree to the above.

## 2019-02-25 NOTE — Telephone Encounter (Signed)
Nichole from [facility] returning call  Patient was read the consent and has verbally agreed

## 2019-02-25 NOTE — Telephone Encounter (Signed)
Called patient from [facility].  Patient is currently at [facility].  Spoke with [employer] Lexicographer) She was agreeable to a video visit 03/02/2019 @10 :40 with the patient using her cell phone.  Made her aware I would fax over a consent form Hadassah Pais @ [phone]) for someone to read to her and call back if she verbally agrees.

## 2019-03-01 NOTE — Progress Notes (Signed)
Virtual Visit via Video Note   This visit type was conducted due to national recommendations for restrictions regarding the COVID-19 Pandemic (e.g. social distancing) in an effort to limit this patient's exposure and mitigate transmission in our community.  Due to her co-morbid illnesses, this patient is at least at moderate risk for complications without adequate follow up.  This format is felt to be most appropriate for this patient at this time.  All issues noted in this document were discussed and addressed.  A limited physical exam was performed with this format.  Please refer to the patient's chart for her consent to telehealth for [facility].   I connected with  [name] [name] on 03/01/19 by a video enabled telemedicine application and verified that I am speaking with the correct person using two identifiers. I discussed the limitations of evaluation and management by telemedicine. The patient expressed understanding and agreed to proceed.   Evaluation Performed:  Follow-up visit  Date:  03/01/2019   ID:  [name] [name], [name] [name] October 21, 1925, MRN [id number]  Patient Location:  [address] [facility] [place] [address]   Provider location:   [facility], [place] office  PCP:  Dorothey Baseman, MD  Cardiologist:  [facility]   Chief Complaint: Leg swelling    History of Present Illness:    [name] [name] is a 85 y.o. female who presents via audio/video conferencing for a telehealth visit today.   The patient does not symptoms concerning for COVID-19 infection (fever, chills, cough, or new SHORTNESS OF BREATH).   Patient has a past medical history of coronary artery disease,  bypass surgery/CABG  hypertension,  diabetes,  chronic lower extremity edema,  Pulmonary hypertension (moderate),  hyperlipidemia,  peripheral vascular disease.  She presents for routine follow-up of her coronary artery disease.  In follow-up today she presents  on video call with the nurse at the facility Recent records reviewed PNA 09/2019 CR2.55 She [facility] up to 5 with improvement down to 2.5 back in November We do not have access to recent lab work but these have been requested  She denies any problems, no chest pain or shortness of breath Very sedentary, does not walk anymore, sits in the wheelchair most of the day " Legs no work" Unable to transfer on her own  Chronic leg swelling, does not wear compression hose, sits with her legs down most of the day  Vitals reviewed with nursing BP 137/70, Pulse: 69 resP 16 Weight 148  There has been a significant drop in weight over the past 2 years from 165 down to 148  We have requested list of her medications for review and reconciliation  Previous LABS: Cholesterol was well controlled total chol 114, LDL 52  Other past medical history Notes from her nursing home show she is receiving torsemide Monday Wednesday Friday for weight more than 162 pounds.  hospitalization in August 2015.  She had embolization of AV fistula of tibial vessels done at [facility] on 08/18/2015with significant procedure blood loss, hemoglobin into the 5 range. She was not transfused as she was a Jehovah witness. In this setting, she had an MI. Echocardiogram showed essentially normal LV function with inferior wall hypokinesis. Plan was made to manage medically Notes also indicate she had acute respiratory failure, acute kidney injury She denies any significant shortness of breath. She is not walking very much, presents in a wheelchair.   Echocardiogram 06/28/2014  showing ejection fraction 65-70%, entire inferior wall with hypokinesis, moderately dilated left atrium  presented to [facility] on November 30, 2011 with hypotension and bradycardia. She was found to be in a junctional rhythm with heart rate of 46 beats per minute, systolic blood pressure of 80. Her Cardizem was held with  improvement of her heart rate and blood pressure.  Baseline creatinine 1.5, elevated liver enzymes.  Echocardiogram December 01, 2011 shows normal systolic function, ejection fraction greater than 55%, mild LVH, mild TR, moderate right ventricular systolic pressures. CT scan of the carotid arteries shows calcification    Prior CV studies:   The following studies were reviewed today:    Past Medical History:  Diagnosis Date  . Blood clot of neck vein   . Chronic diastolic CHF (congestive heart failure) (HCC)    a. December 01, 2011 shows normal systolic function, ejection fraction greater than 55%, mild LVH, mild TR, moderate right ventricular systolic pressures.  . Coronary artery disease    a. s/p CABG in [place] - late 90's/early 2000's;  b. s/p stenting  . Diabetes mellitus   . DVT (deep venous thrombosis) (HCC)    a. RLE - chronic xarelto.  . Hyperlipidemia   . Hypertension   . Hypothyroidism   . Liver cyst   . PVD (peripheral vascular disease) (HCC)    Past Surgical History:  Procedure Laterality Date  . ANGIOPLASTY     right leg  . CARDIAC CATHETERIZATION    . CORONARY ARTERY BYPASS GRAFT  1999  . THYROIDECTOMY       No outpatient medications have been marked as taking for the 03/02/19 encounter (Appointment) with Antonieta Iba, MD.     Allergies:   Acetaminophen and Codeine   Social History   Tobacco Use  . Smoking status: Former Smoker    Packs/day: 1.00    Types: Cigarettes    Last attempt to quit: 03/11/1998    Years since quitting: 20.9  Substance Use Topics  . Alcohol use: No  . Drug use: No     Current Outpatient Medications on File Prior to Visit  Medication Sig Dispense Refill  . allopurinol (ZYLOPRIM) 100 MG tablet Take 100 mg by mouth daily.     . Amino Acids-Protein Hydrolys (FEEDING SUPPLEMENT, PRO-STAT SUGAR FREE 64,) LIQD Take 30 mLs by mouth 2 (two) times daily.    Marland Kitchen aspirin EC 81 MG tablet Take 81 mg by mouth daily.     Marland Kitchen atorvastatin  (LIPITOR) 10 MG tablet Take 10 mg by mouth daily with supper.    Marland Kitchen atorvastatin (LIPITOR) 40 MG tablet Take 1 tablet (40 mg total) by mouth daily. (Patient not taking: Reported on 09/10/2018) 90 tablet 3  . gabapentin (NEURONTIN) 300 MG capsule Take 300 mg by mouth 3 (three) times daily.    . insulin aspart (NOVOLOG) 100 UNIT/ML injection Inject 0-10 Units into the skin 4 (four) times daily -  before meals and at bedtime. If blood sugar is less than 60, call MD If blood sugar is 201-250, give 2 units If blood sugar is 251-300, give 4 units If blood sugar is 301-350, give 6 units If blood sugar is 351-400, give 8 units If blood sugar is 401-450, give 10 units If blood sugar is >450, call MD    . iron polysaccharides (NIFEREX) 150 MG capsule Take 150 mg by mouth daily.     . isosorbide mononitrate (IMDUR) 30 MG 24 hr tablet TAKE ONE TABLET BY MOUTH  EVERY DAY (Patient taking differently: Take 30 mg by mouth daily. ) 90 tablet 3  . LEVEMIR 100 UNIT/ML injection Inject 38 Units into the skin daily.     Marland Kitchen. levothyroxine (SYNTHROID, LEVOTHROID) 100 MCG tablet Take 100 mcg by mouth daily before breakfast.    . losartan (COZAAR) 100 MG tablet Take 100 mg by mouth daily.    . metFORMIN (GLUCOPHAGE-XR) 750 MG 24 hr tablet Take 750 mg by mouth 2 (two) times daily.     . metoprolol (LOPRESSOR) 50 MG tablet Take 50 mg by mouth 2 (two) times daily.    . polyethylene glycol (MIRALAX / GLYCOLAX) packet Take 17 g by mouth daily at 6 PM.     . vitamin B-12 (CYANOCOBALAMIN) 1000 MCG tablet Take 1,000 mcg by mouth daily.     No current facility-administered medications on file prior to visit.      Family Hx: The patient's Family history is unknown by patient.  ROS:   Please see the history of present illness.    Review of Systems  Constitutional: Negative.   Respiratory: Negative.   Cardiovascular: Positive for leg swelling.  Gastrointestinal: Negative.   Musculoskeletal: Negative.        Gait instability   Neurological: Negative.   Psychiatric/Behavioral: Negative.   All other systems reviewed and are negative.     Labs/Other Tests and Data Reviewed:    Recent Labs: 09/10/2018: ALT 17 09/14/2018: Hemoglobin 11.7; Platelets 273 09/15/2018: BUN 44; Creatinine, Ser 2.55; Potassium 4.2; Sodium 145   Recent Lipid Panel Lab Results  Component Value Date/Time   CHOL 114 04/11/2016 10:50 AM   CHOL 81 12/01/2011 04:52 AM   TRIG 130 04/11/2016 10:50 AM   TRIG 87 12/01/2011 04:52 AM   HDL 36 (L) 04/11/2016 10:50 AM   HDL 38 (L) 12/01/2011 04:52 AM   CHOLHDL 3.2 04/11/2016 10:50 AM   CHOLHDL 5.2 03/27/2011 09:18 AM   LDLCALC 52 04/11/2016 10:50 AM   LDLCALC 26 12/01/2011 04:52 AM    Wt Readings from Last 3 Encounters:  09/09/18 165 lb 5.5 oz (75 kg)  02/03/17 166 lb (75.3 kg)  04/11/16 160 lb 2 oz (72.6 kg)     Exam:    Vital Signs: Vital signs may also be detailed in the HPI There were no vitals taken for this visit.  Wt Readings from Last 3 Encounters:  09/09/18 165 lb 5.5 oz (75 kg)  02/03/17 166 lb (75.3 kg)  04/11/16 160 lb 2 oz (72.6 kg)   Temp Readings from Last 3 Encounters:  09/15/18 98.7 F (37.1 C) (Oral)  05/15/14 98 F (36.7 C)   BP Readings from Last 3 Encounters:  09/15/18 (!) 154/76  02/03/17 130/70  04/11/16 130/80   Pulse Readings from Last 3 Encounters:  09/15/18 88  02/03/17 71  04/11/16 71     Well nourished, well developed female in no acute distress.  Sitting in a wheelchair Constitutional:  oriented to person, place, and time. No distress.  Head: Normocephalic and atraumatic.  Eyes:  no discharge. No scleral icterus.  Neck: Normal range of motion. Neck supple.  Pulmonary/Chest: No audible wheezing, no distress, appears comfortable Musculoskeletal: Normal range of motion.  no  tenderness or deformity.  Neurological:   Coordination normal. Full exam not performed Skin:  No rash Psychiatric:  normal mood and affect. behavior is normal.  Thought content normal.    ASSESSMENT & PLAN:    Mixed hyperlipidemia - Plan: EKG 12-Lead We have  requested most recent lab work from her facility Previously LDL well controlled  Essential hypertension - Plan: EKG 12-Lead Blood pressure is well controlled on today's visit. No changes made to the medications. No medication changes made, vitals discussed with nursing at the facility  Chronic diastolic CHF (congestive heart failure) (HCC) - Plan: EKG 12-Lead Grossly appears euvolemic on the video Lab work has been requested No change in her breathing or leg swelling Weight is actually down close to 20 pounds in the past 2 years  Coronary artery disease of native artery of native heart with stable angina pectoris (HCC) - Plan: EKG 12-Lead Currently with no symptoms of angina. No further workup at this time. Continue current medication regimen.  Stable  SOB (shortness of breath) - Plan: EKG 12-Lead Wheelchair bound essentially Denies any change in her symptoms  S/P CABG (coronMAary artery bypass graft) - Plan: EKG 12-Lead Discussed with her, denies any anginal symptoms  Localized edema - Plan: EKG 12-Lead Even with legs down she denies having significant edema Could wear compression hose if symptoms get worse Denies having excessive fluid intake  Type 2 diabetes mellitus with complication, without long-term current use of insulin (HCC) - Plan: EKG 12-Lead Suspect with 20 pounds down over the past 2 years sugars will improve We have requested lab work from facility   COVID-19 Education: The signs and symptoms of COVID-19 were discussed with the patient and how to seek care for testing (follow up with PCP or arrange E-visit).  The importance of social distancing was discussed today.  Patient Risk:   After full review of this patients clinical status, I feel that they are at least moderate risk at this time.  Time:   Today, I have spent 25 minutes with the patient with  telehealth technology discussing the cardiac and medical problems/diagnoses detailed above   10 min spent reviewing the chart prior to patient visit today   Medication Adjustments/Labs and Tests Ordered: Current medicines are reviewed at length with the patient today.  Concerns regarding medicines are outlined above.   Tests Ordered: No tests ordered   Medication Changes: No changes made   Disposition: Follow-up in 12 months   Signed, Julien Nordmann, MD  03/01/2019 5:55 PM    [facility] [address], [place], [place] [address]

## 2019-03-02 ENCOUNTER — Telehealth (INDEPENDENT_AMBULATORY_CARE_PROVIDER_SITE_OTHER): Payer: 59 | Admitting: Cardiovascular Disease

## 2019-03-02 ENCOUNTER — Telehealth: Payer: Self-pay | Admitting: Cardiovascular Disease

## 2019-03-02 ENCOUNTER — Telehealth: Payer: Self-pay

## 2019-03-02 ENCOUNTER — Other Ambulatory Visit: Payer: Self-pay

## 2019-03-02 DIAGNOSIS — I25118 Atherosclerotic heart disease of native coronary artery with other forms of angina pectoris: Secondary | ICD-10-CM

## 2019-03-02 DIAGNOSIS — E118 Type 2 diabetes mellitus with unspecified complications: Secondary | ICD-10-CM

## 2019-03-02 DIAGNOSIS — I5032 Chronic diastolic (congestive) heart failure: Secondary | ICD-10-CM

## 2019-03-02 DIAGNOSIS — N179 Acute kidney failure, unspecified: Secondary | ICD-10-CM

## 2019-03-02 DIAGNOSIS — R600 Localized edema: Secondary | ICD-10-CM

## 2019-03-02 DIAGNOSIS — E782 Mixed hyperlipidemia: Secondary | ICD-10-CM

## 2019-03-02 DIAGNOSIS — N183 Chronic kidney disease, stage 3 (moderate): Secondary | ICD-10-CM

## 2019-03-02 DIAGNOSIS — I10 Essential (primary) hypertension: Secondary | ICD-10-CM

## 2019-03-02 DIAGNOSIS — I739 Peripheral vascular disease, unspecified: Secondary | ICD-10-CM | POA: Diagnosis not present

## 2019-03-02 DIAGNOSIS — I110 Hypertensive heart disease with heart failure: Secondary | ICD-10-CM

## 2019-03-02 NOTE — Telephone Encounter (Signed)
The patient had an E-visit with Dr. Mariah Milling this morning. He asked that we call [facility] at [phone] & speak with Lonzo Cloud.   She was needing our fax # to send copies of the patient most recent lab work & medication list.   Fax # given- per Joni Reining, she has this information together and will go ahead and send it to [place].  Onnie Boer, RN in office today made aware to look for these.

## 2019-03-02 NOTE — Telephone Encounter (Signed)
Fax from [facility] received and given to Dr Mariah Milling to review.

## 2019-03-02 NOTE — Telephone Encounter (Signed)
Called patient.  No answer. LMOV. Need to obtained vitals.  Need to review medications and allergies with patient.

## 2019-03-02 NOTE — Telephone Encounter (Signed)
[name] [name] called back from [facility]  Made her aware that Dr. Mariah Milling would obtain vitals during her visit so to try to obtain them before he calls.  Reviewed the steps on how the video visit would work.  Confirmed the number the link was to be sent to.

## 2019-03-02 NOTE — Patient Instructions (Addendum)
Medication Instructions:  No changes  If you need a refill on your cardiac medications before your next appointment, please call your pharmacy.    Lab work: No new labs needed   If you have labs (blood work) drawn today and your tests are completely normal, you will receive your results only by: Marland Kitchen MyChart Message (if you have MyChart) OR . A paper copy in the mail If you have any lab test that is abnormal or we need to change your treatment, we will call you to review the results.   Testing/Procedures: No new testing needed   Follow-Up: At [facility], you and your health needs are our priority.  As part of our continuing mission to provide you with exceptional heart care, we have created designated Provider Care Teams.  These Care Teams include your primary Cardiologist (physician) and Advanced Practice Providers (APPs -  Physician Assistants and Nurse Practitioners) who all work together to provide you with the care you need, when you need it.  . You will need a follow up appointment in 12 months .   Please call our office 2 months in advance to schedule this appointment.  (please call in early February 2021 to schedule).   . Providers on your designated Care Team:   . Nicolasa Ducking, NP . Eula Listen, PA-C . Marisue Ivan, PA-C  Any Other Special Instructions Will Be Listed Below (If Applicable).  For educational health videos Log in to : [email or web address] Or : [email or web address], password : [email or web address]

## 2019-05-11 ENCOUNTER — Other Ambulatory Visit
Admission: RE | Admit: 2019-05-11 | Discharge: 2019-05-11 | Disposition: A | Discharge status: Home / Self Care | Payer: Medicare HMO | Source: Ambulatory Visit | Attending: Family Medicine | Admitting: Family Medicine

## 2019-05-11 DIAGNOSIS — N390 Urinary tract infection, site not specified: Secondary | ICD-10-CM | POA: Diagnosis not present

## 2019-05-11 LAB — URINALYSIS, COMPLETE (UACMP) WITH MICROSCOPIC
Bilirubin Urine: NEGATIVE
Glucose, UA: NEGATIVE mg/dL
Hgb urine dipstick: NEGATIVE
Ketones, ur: NEGATIVE mg/dL
Nitrite: NEGATIVE
Protein, ur: 30 mg/dL — AB
Specific Gravity, Urine: 1.015 (ref 1.005–1.030)
pH: 5 (ref 5.0–8.0)

## 2019-05-14 LAB — URINE CULTURE: Culture: >=100000 — AB

## 2019-05-17 DIAGNOSIS — D649 Anemia, unspecified: Secondary | ICD-10-CM | POA: Diagnosis not present

## 2019-05-17 DIAGNOSIS — M109 Gout, unspecified: Secondary | ICD-10-CM | POA: Diagnosis not present

## 2019-05-17 DIAGNOSIS — K5900 Constipation, unspecified: Secondary | ICD-10-CM | POA: Diagnosis not present

## 2019-05-17 DIAGNOSIS — E785 Hyperlipidemia, unspecified: Secondary | ICD-10-CM | POA: Diagnosis not present

## 2019-05-17 DIAGNOSIS — I10 Essential (primary) hypertension: Secondary | ICD-10-CM | POA: Diagnosis not present

## 2019-05-17 DIAGNOSIS — I739 Peripheral vascular disease, unspecified: Secondary | ICD-10-CM | POA: Diagnosis not present

## 2019-05-17 DIAGNOSIS — R05 Cough: Secondary | ICD-10-CM | POA: Diagnosis not present

## 2019-05-17 DIAGNOSIS — E11319 Type 2 diabetes mellitus with unspecified diabetic retinopathy without macular edema: Secondary | ICD-10-CM | POA: Diagnosis not present

## 2019-05-17 DIAGNOSIS — E039 Hypothyroidism, unspecified: Secondary | ICD-10-CM | POA: Diagnosis not present

## 2019-05-20 DIAGNOSIS — Z20828 Contact with and (suspected) exposure to other viral communicable diseases: Secondary | ICD-10-CM | POA: Diagnosis not present

## 2019-05-23 DIAGNOSIS — M6281 Muscle weakness (generalized): Secondary | ICD-10-CM | POA: Diagnosis not present

## 2019-05-23 DIAGNOSIS — I213 ST elevation (STEMI) myocardial infarction of unspecified site: Secondary | ICD-10-CM | POA: Diagnosis not present

## 2019-05-23 DIAGNOSIS — R262 Difficulty in walking, not elsewhere classified: Secondary | ICD-10-CM | POA: Diagnosis not present

## 2019-05-23 DIAGNOSIS — I739 Peripheral vascular disease, unspecified: Secondary | ICD-10-CM | POA: Diagnosis not present

## 2019-05-23 DIAGNOSIS — I10 Essential (primary) hypertension: Secondary | ICD-10-CM | POA: Diagnosis not present

## 2019-05-23 DIAGNOSIS — M1A9XX0 Chronic gout, unspecified, without tophus (tophi): Secondary | ICD-10-CM | POA: Diagnosis not present

## 2019-05-24 DIAGNOSIS — R262 Difficulty in walking, not elsewhere classified: Secondary | ICD-10-CM | POA: Diagnosis not present

## 2019-05-24 DIAGNOSIS — I213 ST elevation (STEMI) myocardial infarction of unspecified site: Secondary | ICD-10-CM | POA: Diagnosis not present

## 2019-05-24 DIAGNOSIS — I10 Essential (primary) hypertension: Secondary | ICD-10-CM | POA: Diagnosis not present

## 2019-05-24 DIAGNOSIS — M1A9XX0 Chronic gout, unspecified, without tophus (tophi): Secondary | ICD-10-CM | POA: Diagnosis not present

## 2019-05-24 DIAGNOSIS — I739 Peripheral vascular disease, unspecified: Secondary | ICD-10-CM | POA: Diagnosis not present

## 2019-05-24 DIAGNOSIS — M6281 Muscle weakness (generalized): Secondary | ICD-10-CM | POA: Diagnosis not present

## 2019-05-25 DIAGNOSIS — M1A9XX0 Chronic gout, unspecified, without tophus (tophi): Secondary | ICD-10-CM | POA: Diagnosis not present

## 2019-05-25 DIAGNOSIS — I213 ST elevation (STEMI) myocardial infarction of unspecified site: Secondary | ICD-10-CM | POA: Diagnosis not present

## 2019-05-25 DIAGNOSIS — R262 Difficulty in walking, not elsewhere classified: Secondary | ICD-10-CM | POA: Diagnosis not present

## 2019-05-25 DIAGNOSIS — I10 Essential (primary) hypertension: Secondary | ICD-10-CM | POA: Diagnosis not present

## 2019-05-25 DIAGNOSIS — I739 Peripheral vascular disease, unspecified: Secondary | ICD-10-CM | POA: Diagnosis not present

## 2019-05-25 DIAGNOSIS — M6281 Muscle weakness (generalized): Secondary | ICD-10-CM | POA: Diagnosis not present

## 2019-05-26 DIAGNOSIS — I739 Peripheral vascular disease, unspecified: Secondary | ICD-10-CM | POA: Diagnosis not present

## 2019-05-26 DIAGNOSIS — M1A9XX0 Chronic gout, unspecified, without tophus (tophi): Secondary | ICD-10-CM | POA: Diagnosis not present

## 2019-05-26 DIAGNOSIS — M6281 Muscle weakness (generalized): Secondary | ICD-10-CM | POA: Diagnosis not present

## 2019-05-26 DIAGNOSIS — R262 Difficulty in walking, not elsewhere classified: Secondary | ICD-10-CM | POA: Diagnosis not present

## 2019-05-26 DIAGNOSIS — I10 Essential (primary) hypertension: Secondary | ICD-10-CM | POA: Diagnosis not present

## 2019-05-26 DIAGNOSIS — I213 ST elevation (STEMI) myocardial infarction of unspecified site: Secondary | ICD-10-CM | POA: Diagnosis not present

## 2019-05-27 DIAGNOSIS — I739 Peripheral vascular disease, unspecified: Secondary | ICD-10-CM | POA: Diagnosis not present

## 2019-05-27 DIAGNOSIS — M6281 Muscle weakness (generalized): Secondary | ICD-10-CM | POA: Diagnosis not present

## 2019-05-27 DIAGNOSIS — M1A9XX0 Chronic gout, unspecified, without tophus (tophi): Secondary | ICD-10-CM | POA: Diagnosis not present

## 2019-05-27 DIAGNOSIS — R262 Difficulty in walking, not elsewhere classified: Secondary | ICD-10-CM | POA: Diagnosis not present

## 2019-05-27 DIAGNOSIS — I10 Essential (primary) hypertension: Secondary | ICD-10-CM | POA: Diagnosis not present

## 2019-05-27 DIAGNOSIS — I213 ST elevation (STEMI) myocardial infarction of unspecified site: Secondary | ICD-10-CM | POA: Diagnosis not present

## 2019-05-30 DIAGNOSIS — M6281 Muscle weakness (generalized): Secondary | ICD-10-CM | POA: Diagnosis not present

## 2019-05-30 DIAGNOSIS — I739 Peripheral vascular disease, unspecified: Secondary | ICD-10-CM | POA: Diagnosis not present

## 2019-05-30 DIAGNOSIS — M1A9XX0 Chronic gout, unspecified, without tophus (tophi): Secondary | ICD-10-CM | POA: Diagnosis not present

## 2019-05-30 DIAGNOSIS — R262 Difficulty in walking, not elsewhere classified: Secondary | ICD-10-CM | POA: Diagnosis not present

## 2019-05-30 DIAGNOSIS — I10 Essential (primary) hypertension: Secondary | ICD-10-CM | POA: Diagnosis not present

## 2019-05-30 DIAGNOSIS — I213 ST elevation (STEMI) myocardial infarction of unspecified site: Secondary | ICD-10-CM | POA: Diagnosis not present

## 2019-05-31 DIAGNOSIS — I739 Peripheral vascular disease, unspecified: Secondary | ICD-10-CM | POA: Diagnosis not present

## 2019-05-31 DIAGNOSIS — M1A9XX0 Chronic gout, unspecified, without tophus (tophi): Secondary | ICD-10-CM | POA: Diagnosis not present

## 2019-05-31 DIAGNOSIS — I213 ST elevation (STEMI) myocardial infarction of unspecified site: Secondary | ICD-10-CM | POA: Diagnosis not present

## 2019-05-31 DIAGNOSIS — M6281 Muscle weakness (generalized): Secondary | ICD-10-CM | POA: Diagnosis not present

## 2019-05-31 DIAGNOSIS — I10 Essential (primary) hypertension: Secondary | ICD-10-CM | POA: Diagnosis not present

## 2019-05-31 DIAGNOSIS — R262 Difficulty in walking, not elsewhere classified: Secondary | ICD-10-CM | POA: Diagnosis not present

## 2019-06-01 DIAGNOSIS — I213 ST elevation (STEMI) myocardial infarction of unspecified site: Secondary | ICD-10-CM | POA: Diagnosis not present

## 2019-06-01 DIAGNOSIS — I739 Peripheral vascular disease, unspecified: Secondary | ICD-10-CM | POA: Diagnosis not present

## 2019-06-01 DIAGNOSIS — R262 Difficulty in walking, not elsewhere classified: Secondary | ICD-10-CM | POA: Diagnosis not present

## 2019-06-01 DIAGNOSIS — M6281 Muscle weakness (generalized): Secondary | ICD-10-CM | POA: Diagnosis not present

## 2019-06-01 DIAGNOSIS — I10 Essential (primary) hypertension: Secondary | ICD-10-CM | POA: Diagnosis not present

## 2019-06-01 DIAGNOSIS — M1A9XX0 Chronic gout, unspecified, without tophus (tophi): Secondary | ICD-10-CM | POA: Diagnosis not present

## 2019-06-02 DIAGNOSIS — M6281 Muscle weakness (generalized): Secondary | ICD-10-CM | POA: Diagnosis not present

## 2019-06-02 DIAGNOSIS — I213 ST elevation (STEMI) myocardial infarction of unspecified site: Secondary | ICD-10-CM | POA: Diagnosis not present

## 2019-06-02 DIAGNOSIS — M1A9XX0 Chronic gout, unspecified, without tophus (tophi): Secondary | ICD-10-CM | POA: Diagnosis not present

## 2019-06-02 DIAGNOSIS — I10 Essential (primary) hypertension: Secondary | ICD-10-CM | POA: Diagnosis not present

## 2019-06-02 DIAGNOSIS — I739 Peripheral vascular disease, unspecified: Secondary | ICD-10-CM | POA: Diagnosis not present

## 2019-06-02 DIAGNOSIS — R262 Difficulty in walking, not elsewhere classified: Secondary | ICD-10-CM | POA: Diagnosis not present

## 2019-06-03 DIAGNOSIS — M1A9XX0 Chronic gout, unspecified, without tophus (tophi): Secondary | ICD-10-CM | POA: Diagnosis not present

## 2019-06-03 DIAGNOSIS — I213 ST elevation (STEMI) myocardial infarction of unspecified site: Secondary | ICD-10-CM | POA: Diagnosis not present

## 2019-06-03 DIAGNOSIS — I739 Peripheral vascular disease, unspecified: Secondary | ICD-10-CM | POA: Diagnosis not present

## 2019-06-03 DIAGNOSIS — I10 Essential (primary) hypertension: Secondary | ICD-10-CM | POA: Diagnosis not present

## 2019-06-03 DIAGNOSIS — R262 Difficulty in walking, not elsewhere classified: Secondary | ICD-10-CM | POA: Diagnosis not present

## 2019-06-03 DIAGNOSIS — M6281 Muscle weakness (generalized): Secondary | ICD-10-CM | POA: Diagnosis not present

## 2019-06-06 DIAGNOSIS — M6281 Muscle weakness (generalized): Secondary | ICD-10-CM | POA: Diagnosis not present

## 2019-06-06 DIAGNOSIS — I10 Essential (primary) hypertension: Secondary | ICD-10-CM | POA: Diagnosis not present

## 2019-06-06 DIAGNOSIS — R262 Difficulty in walking, not elsewhere classified: Secondary | ICD-10-CM | POA: Diagnosis not present

## 2019-06-06 DIAGNOSIS — I213 ST elevation (STEMI) myocardial infarction of unspecified site: Secondary | ICD-10-CM | POA: Diagnosis not present

## 2019-06-06 DIAGNOSIS — I739 Peripheral vascular disease, unspecified: Secondary | ICD-10-CM | POA: Diagnosis not present

## 2019-06-06 DIAGNOSIS — M1A9XX0 Chronic gout, unspecified, without tophus (tophi): Secondary | ICD-10-CM | POA: Diagnosis not present

## 2019-06-07 DIAGNOSIS — I10 Essential (primary) hypertension: Secondary | ICD-10-CM | POA: Diagnosis not present

## 2019-06-07 DIAGNOSIS — M6281 Muscle weakness (generalized): Secondary | ICD-10-CM | POA: Diagnosis not present

## 2019-06-07 DIAGNOSIS — I213 ST elevation (STEMI) myocardial infarction of unspecified site: Secondary | ICD-10-CM | POA: Diagnosis not present

## 2019-06-07 DIAGNOSIS — R262 Difficulty in walking, not elsewhere classified: Secondary | ICD-10-CM | POA: Diagnosis not present

## 2019-06-07 DIAGNOSIS — I739 Peripheral vascular disease, unspecified: Secondary | ICD-10-CM | POA: Diagnosis not present

## 2019-06-07 DIAGNOSIS — M1A9XX0 Chronic gout, unspecified, without tophus (tophi): Secondary | ICD-10-CM | POA: Diagnosis not present

## 2019-06-08 DIAGNOSIS — I739 Peripheral vascular disease, unspecified: Secondary | ICD-10-CM | POA: Diagnosis not present

## 2019-06-08 DIAGNOSIS — I213 ST elevation (STEMI) myocardial infarction of unspecified site: Secondary | ICD-10-CM | POA: Diagnosis not present

## 2019-06-08 DIAGNOSIS — M6281 Muscle weakness (generalized): Secondary | ICD-10-CM | POA: Diagnosis not present

## 2019-06-08 DIAGNOSIS — M1A9XX0 Chronic gout, unspecified, without tophus (tophi): Secondary | ICD-10-CM | POA: Diagnosis not present

## 2019-06-08 DIAGNOSIS — R262 Difficulty in walking, not elsewhere classified: Secondary | ICD-10-CM | POA: Diagnosis not present

## 2019-06-08 DIAGNOSIS — I10 Essential (primary) hypertension: Secondary | ICD-10-CM | POA: Diagnosis not present

## 2019-06-09 DIAGNOSIS — I213 ST elevation (STEMI) myocardial infarction of unspecified site: Secondary | ICD-10-CM | POA: Diagnosis not present

## 2019-06-09 DIAGNOSIS — I10 Essential (primary) hypertension: Secondary | ICD-10-CM | POA: Diagnosis not present

## 2019-06-09 DIAGNOSIS — M6281 Muscle weakness (generalized): Secondary | ICD-10-CM | POA: Diagnosis not present

## 2019-06-09 DIAGNOSIS — I739 Peripheral vascular disease, unspecified: Secondary | ICD-10-CM | POA: Diagnosis not present

## 2019-06-09 DIAGNOSIS — M1A9XX0 Chronic gout, unspecified, without tophus (tophi): Secondary | ICD-10-CM | POA: Diagnosis not present

## 2019-06-09 DIAGNOSIS — R262 Difficulty in walking, not elsewhere classified: Secondary | ICD-10-CM | POA: Diagnosis not present

## 2019-06-10 DIAGNOSIS — I10 Essential (primary) hypertension: Secondary | ICD-10-CM | POA: Diagnosis not present

## 2019-06-10 DIAGNOSIS — M1A9XX0 Chronic gout, unspecified, without tophus (tophi): Secondary | ICD-10-CM | POA: Diagnosis not present

## 2019-06-10 DIAGNOSIS — I213 ST elevation (STEMI) myocardial infarction of unspecified site: Secondary | ICD-10-CM | POA: Diagnosis not present

## 2019-06-10 DIAGNOSIS — I739 Peripheral vascular disease, unspecified: Secondary | ICD-10-CM | POA: Diagnosis not present

## 2019-06-10 DIAGNOSIS — R262 Difficulty in walking, not elsewhere classified: Secondary | ICD-10-CM | POA: Diagnosis not present

## 2019-06-10 DIAGNOSIS — M6281 Muscle weakness (generalized): Secondary | ICD-10-CM | POA: Diagnosis not present

## 2019-06-13 DIAGNOSIS — I10 Essential (primary) hypertension: Secondary | ICD-10-CM | POA: Diagnosis not present

## 2019-06-13 DIAGNOSIS — R262 Difficulty in walking, not elsewhere classified: Secondary | ICD-10-CM | POA: Diagnosis not present

## 2019-06-13 DIAGNOSIS — I213 ST elevation (STEMI) myocardial infarction of unspecified site: Secondary | ICD-10-CM | POA: Diagnosis not present

## 2019-06-13 DIAGNOSIS — I739 Peripheral vascular disease, unspecified: Secondary | ICD-10-CM | POA: Diagnosis not present

## 2019-06-13 DIAGNOSIS — M6281 Muscle weakness (generalized): Secondary | ICD-10-CM | POA: Diagnosis not present

## 2019-06-14 DIAGNOSIS — R262 Difficulty in walking, not elsewhere classified: Secondary | ICD-10-CM | POA: Diagnosis not present

## 2019-06-14 DIAGNOSIS — I739 Peripheral vascular disease, unspecified: Secondary | ICD-10-CM | POA: Diagnosis not present

## 2019-06-14 DIAGNOSIS — I213 ST elevation (STEMI) myocardial infarction of unspecified site: Secondary | ICD-10-CM | POA: Diagnosis not present

## 2019-06-14 DIAGNOSIS — M6281 Muscle weakness (generalized): Secondary | ICD-10-CM | POA: Diagnosis not present

## 2019-06-14 DIAGNOSIS — I10 Essential (primary) hypertension: Secondary | ICD-10-CM | POA: Diagnosis not present

## 2019-06-15 DIAGNOSIS — M6281 Muscle weakness (generalized): Secondary | ICD-10-CM | POA: Diagnosis not present

## 2019-06-15 DIAGNOSIS — I213 ST elevation (STEMI) myocardial infarction of unspecified site: Secondary | ICD-10-CM | POA: Diagnosis not present

## 2019-06-15 DIAGNOSIS — R262 Difficulty in walking, not elsewhere classified: Secondary | ICD-10-CM | POA: Diagnosis not present

## 2019-06-15 DIAGNOSIS — I10 Essential (primary) hypertension: Secondary | ICD-10-CM | POA: Diagnosis not present

## 2019-06-15 DIAGNOSIS — I739 Peripheral vascular disease, unspecified: Secondary | ICD-10-CM | POA: Diagnosis not present

## 2019-06-16 DIAGNOSIS — I213 ST elevation (STEMI) myocardial infarction of unspecified site: Secondary | ICD-10-CM | POA: Diagnosis not present

## 2019-06-16 DIAGNOSIS — I10 Essential (primary) hypertension: Secondary | ICD-10-CM | POA: Diagnosis not present

## 2019-06-16 DIAGNOSIS — I739 Peripheral vascular disease, unspecified: Secondary | ICD-10-CM | POA: Diagnosis not present

## 2019-06-16 DIAGNOSIS — M6281 Muscle weakness (generalized): Secondary | ICD-10-CM | POA: Diagnosis not present

## 2019-06-16 DIAGNOSIS — R262 Difficulty in walking, not elsewhere classified: Secondary | ICD-10-CM | POA: Diagnosis not present

## 2019-06-17 DIAGNOSIS — R262 Difficulty in walking, not elsewhere classified: Secondary | ICD-10-CM | POA: Diagnosis not present

## 2019-06-17 DIAGNOSIS — I739 Peripheral vascular disease, unspecified: Secondary | ICD-10-CM | POA: Diagnosis not present

## 2019-06-17 DIAGNOSIS — M6281 Muscle weakness (generalized): Secondary | ICD-10-CM | POA: Diagnosis not present

## 2019-06-17 DIAGNOSIS — I213 ST elevation (STEMI) myocardial infarction of unspecified site: Secondary | ICD-10-CM | POA: Diagnosis not present

## 2019-06-17 DIAGNOSIS — I10 Essential (primary) hypertension: Secondary | ICD-10-CM | POA: Diagnosis not present

## 2019-06-21 DIAGNOSIS — L603 Nail dystrophy: Secondary | ICD-10-CM | POA: Diagnosis not present

## 2019-06-21 DIAGNOSIS — E1051 Type 1 diabetes mellitus with diabetic peripheral angiopathy without gangrene: Secondary | ICD-10-CM | POA: Diagnosis not present

## 2019-06-21 DIAGNOSIS — B351 Tinea unguium: Secondary | ICD-10-CM | POA: Diagnosis not present

## 2019-06-21 DIAGNOSIS — Z794 Long term (current) use of insulin: Secondary | ICD-10-CM | POA: Diagnosis not present

## 2019-06-28 DIAGNOSIS — D649 Anemia, unspecified: Secondary | ICD-10-CM | POA: Diagnosis not present

## 2019-06-28 DIAGNOSIS — E785 Hyperlipidemia, unspecified: Secondary | ICD-10-CM | POA: Diagnosis not present

## 2019-06-28 DIAGNOSIS — E039 Hypothyroidism, unspecified: Secondary | ICD-10-CM | POA: Diagnosis not present

## 2019-06-28 DIAGNOSIS — E119 Type 2 diabetes mellitus without complications: Secondary | ICD-10-CM | POA: Diagnosis not present

## 2019-06-28 DIAGNOSIS — M1A9XX0 Chronic gout, unspecified, without tophus (tophi): Secondary | ICD-10-CM | POA: Diagnosis not present

## 2019-06-28 DIAGNOSIS — E0840 Diabetes mellitus due to underlying condition with diabetic neuropathy, unspecified: Secondary | ICD-10-CM | POA: Diagnosis not present

## 2019-07-01 DIAGNOSIS — H3411 Central retinal artery occlusion, right eye: Secondary | ICD-10-CM | POA: Diagnosis not present

## 2019-07-01 DIAGNOSIS — H35013 Changes in retinal vascular appearance, bilateral: Secondary | ICD-10-CM | POA: Diagnosis not present

## 2019-07-01 DIAGNOSIS — E103513 Type 1 diabetes mellitus with proliferative diabetic retinopathy with macular edema, bilateral: Secondary | ICD-10-CM | POA: Diagnosis not present

## 2019-07-01 DIAGNOSIS — H04123 Dry eye syndrome of bilateral lacrimal glands: Secondary | ICD-10-CM | POA: Diagnosis not present

## 2019-07-11 DIAGNOSIS — Z20828 Contact with and (suspected) exposure to other viral communicable diseases: Secondary | ICD-10-CM | POA: Diagnosis not present

## 2019-07-18 DIAGNOSIS — Z20828 Contact with and (suspected) exposure to other viral communicable diseases: Secondary | ICD-10-CM | POA: Diagnosis not present

## 2019-07-21 DIAGNOSIS — I739 Peripheral vascular disease, unspecified: Secondary | ICD-10-CM | POA: Diagnosis not present

## 2019-07-21 DIAGNOSIS — I10 Essential (primary) hypertension: Secondary | ICD-10-CM | POA: Diagnosis not present

## 2019-07-21 DIAGNOSIS — K5900 Constipation, unspecified: Secondary | ICD-10-CM | POA: Diagnosis not present

## 2019-07-21 DIAGNOSIS — E11319 Type 2 diabetes mellitus with unspecified diabetic retinopathy without macular edema: Secondary | ICD-10-CM | POA: Diagnosis not present

## 2019-07-21 DIAGNOSIS — M109 Gout, unspecified: Secondary | ICD-10-CM | POA: Diagnosis not present

## 2019-07-21 DIAGNOSIS — I2510 Atherosclerotic heart disease of native coronary artery without angina pectoris: Secondary | ICD-10-CM | POA: Diagnosis not present

## 2019-07-21 DIAGNOSIS — E785 Hyperlipidemia, unspecified: Secondary | ICD-10-CM | POA: Diagnosis not present

## 2019-07-21 DIAGNOSIS — D509 Iron deficiency anemia, unspecified: Secondary | ICD-10-CM | POA: Diagnosis not present

## 2019-07-21 DIAGNOSIS — J309 Allergic rhinitis, unspecified: Secondary | ICD-10-CM | POA: Diagnosis not present

## 2019-07-25 DIAGNOSIS — Z1159 Encounter for screening for other viral diseases: Secondary | ICD-10-CM | POA: Diagnosis not present

## 2019-08-01 DIAGNOSIS — Z1159 Encounter for screening for other viral diseases: Secondary | ICD-10-CM | POA: Diagnosis not present

## 2019-08-15 DIAGNOSIS — H35373 Puckering of macula, bilateral: Secondary | ICD-10-CM | POA: Diagnosis not present

## 2019-08-15 DIAGNOSIS — H35033 Hypertensive retinopathy, bilateral: Secondary | ICD-10-CM | POA: Diagnosis not present

## 2019-08-15 DIAGNOSIS — H34811 Central retinal vein occlusion, right eye, with macular edema: Secondary | ICD-10-CM | POA: Diagnosis not present

## 2019-08-15 DIAGNOSIS — E113312 Type 2 diabetes mellitus with moderate nonproliferative diabetic retinopathy with macular edema, left eye: Secondary | ICD-10-CM | POA: Diagnosis not present

## 2019-08-23 DIAGNOSIS — Z1159 Encounter for screening for other viral diseases: Secondary | ICD-10-CM | POA: Diagnosis not present

## 2019-08-25 ENCOUNTER — Emergency Department: Payer: Medicare HMO

## 2019-08-25 ENCOUNTER — Emergency Department
Admission: EM | Admit: 2019-08-25 | Discharge: 2019-08-25 | Disposition: A | Discharge status: Home / Self Care | Payer: Medicare HMO | Attending: Student | Admitting: Student

## 2019-08-25 ENCOUNTER — Encounter: Payer: Self-pay | Admitting: Emergency Medicine

## 2019-08-25 ENCOUNTER — Other Ambulatory Visit: Payer: Self-pay

## 2019-08-25 DIAGNOSIS — I130 Hypertensive heart and chronic kidney disease with heart failure and stage 1 through stage 4 chronic kidney disease, or unspecified chronic kidney disease: Secondary | ICD-10-CM | POA: Insufficient documentation

## 2019-08-25 DIAGNOSIS — Z951 Presence of aortocoronary bypass graft: Secondary | ICD-10-CM | POA: Insufficient documentation

## 2019-08-25 DIAGNOSIS — R05 Cough: Secondary | ICD-10-CM | POA: Diagnosis not present

## 2019-08-25 DIAGNOSIS — E1122 Type 2 diabetes mellitus with diabetic chronic kidney disease: Secondary | ICD-10-CM | POA: Insufficient documentation

## 2019-08-25 DIAGNOSIS — I5032 Chronic diastolic (congestive) heart failure: Secondary | ICD-10-CM | POA: Diagnosis not present

## 2019-08-25 DIAGNOSIS — E039 Hypothyroidism, unspecified: Secondary | ICD-10-CM | POA: Insufficient documentation

## 2019-08-25 DIAGNOSIS — Z7401 Bed confinement status: Secondary | ICD-10-CM | POA: Diagnosis not present

## 2019-08-25 DIAGNOSIS — Z794 Long term (current) use of insulin: Secondary | ICD-10-CM | POA: Insufficient documentation

## 2019-08-25 DIAGNOSIS — N189 Chronic kidney disease, unspecified: Secondary | ICD-10-CM | POA: Insufficient documentation

## 2019-08-25 DIAGNOSIS — M2550 Pain in unspecified joint: Secondary | ICD-10-CM | POA: Diagnosis not present

## 2019-08-25 DIAGNOSIS — U071 COVID-19: Secondary | ICD-10-CM | POA: Insufficient documentation

## 2019-08-25 DIAGNOSIS — N390 Urinary tract infection, site not specified: Secondary | ICD-10-CM | POA: Insufficient documentation

## 2019-08-25 DIAGNOSIS — I2510 Atherosclerotic heart disease of native coronary artery without angina pectoris: Secondary | ICD-10-CM | POA: Diagnosis not present

## 2019-08-25 DIAGNOSIS — I10 Essential (primary) hypertension: Secondary | ICD-10-CM | POA: Diagnosis not present

## 2019-08-25 DIAGNOSIS — Z20822 Contact with and (suspected) exposure to covid-19: Secondary | ICD-10-CM

## 2019-08-25 DIAGNOSIS — Z79899 Other long term (current) drug therapy: Secondary | ICD-10-CM | POA: Diagnosis not present

## 2019-08-25 DIAGNOSIS — R531 Weakness: Secondary | ICD-10-CM | POA: Diagnosis not present

## 2019-08-25 DIAGNOSIS — R404 Transient alteration of awareness: Secondary | ICD-10-CM | POA: Diagnosis not present

## 2019-08-25 DIAGNOSIS — Z87891 Personal history of nicotine dependence: Secondary | ICD-10-CM | POA: Diagnosis not present

## 2019-08-25 DIAGNOSIS — I959 Hypotension, unspecified: Secondary | ICD-10-CM | POA: Diagnosis not present

## 2019-08-25 DIAGNOSIS — R0902 Hypoxemia: Secondary | ICD-10-CM | POA: Diagnosis not present

## 2019-08-25 LAB — URINALYSIS, COMPLETE (UACMP) WITH MICROSCOPIC
Bilirubin Urine: NEGATIVE
Glucose, UA: NEGATIVE mg/dL
Hgb urine dipstick: NEGATIVE
Ketones, ur: NEGATIVE mg/dL
Nitrite: NEGATIVE
Protein, ur: 100 mg/dL — AB
Specific Gravity, Urine: 1.016 (ref 1.005–1.030)
Squamous Epithelial / LPF: NONE SEEN (ref 0–5)
WBC, UA: >50 WBC/hpf — ABNORMAL HIGH (ref 0–5)
pH: 5 (ref 5.0–8.0)

## 2019-08-25 LAB — CBC WITH DIFFERENTIAL/PLATELET
Abs Immature Granulocytes: 0.02 10*3/uL (ref 0.00–0.07)
Basophils Absolute: 0 10*3/uL (ref 0.0–0.1)
Basophils Relative: 0 %
Eosinophils Absolute: 0 10*3/uL (ref 0.0–0.5)
Eosinophils Relative: 0 %
HCT: 31.4 % — ABNORMAL LOW (ref 36.0–46.0)
Hemoglobin: 10.3 g/dL — ABNORMAL LOW (ref 12.0–15.0)
Immature Granulocytes: 0 %
Lymphocytes Relative: 32 %
Lymphs Abs: 1.6 10*3/uL (ref 0.7–4.0)
MCH: 30.4 pg (ref 26.0–34.0)
MCHC: 32.8 g/dL (ref 30.0–36.0)
MCV: 92.6 fL (ref 80.0–100.0)
Monocytes Absolute: 0.4 10*3/uL (ref 0.1–1.0)
Monocytes Relative: 9 %
Neutro Abs: 2.9 10*3/uL (ref 1.7–7.7)
Neutrophils Relative %: 59 %
Platelets: 167 10*3/uL (ref 150–400)
RBC: 3.39 MIL/uL — ABNORMAL LOW (ref 3.87–5.11)
RDW: 12.2 % (ref 11.5–15.5)
WBC: 4.9 10*3/uL (ref 4.0–10.5)
nRBC: 0 % (ref 0.0–0.2)

## 2019-08-25 LAB — COMPREHENSIVE METABOLIC PANEL
ALT: 16 U/L (ref 0–44)
AST: 19 U/L (ref 15–41)
Albumin: 3.7 g/dL (ref 3.5–5.0)
Alkaline Phosphatase: 159 U/L — ABNORMAL HIGH (ref 38–126)
Anion gap: 6 (ref 5–15)
BUN: 33 mg/dL — ABNORMAL HIGH (ref 8–23)
CO2: 24 mmol/L (ref 22–32)
Calcium: 8.6 mg/dL — ABNORMAL LOW (ref 8.9–10.3)
Chloride: 107 mmol/L (ref 98–111)
Creatinine, Ser: 2.62 mg/dL — ABNORMAL HIGH (ref 0.44–1.00)
GFR calc Af Amer: 18 mL/min — ABNORMAL LOW (ref >60)
GFR calc non Af Amer: 15 mL/min — ABNORMAL LOW (ref >60)
Glucose, Bld: 241 mg/dL — ABNORMAL HIGH (ref 70–99)
Potassium: 3.8 mmol/L (ref 3.5–5.1)
Sodium: 137 mmol/L (ref 135–145)
Total Bilirubin: 0.7 mg/dL (ref 0.3–1.2)
Total Protein: 6.9 g/dL (ref 6.5–8.1)

## 2019-08-25 MED ORDER — CEPHALEXIN 500 MG PO CAPS: 500.0000 mg | ORAL_CAPSULE | Freq: Two times a day (BID) | ORAL | 0 refills | Status: AC

## 2019-08-25 MED ORDER — SODIUM CHLORIDE 0.9 % IV SOLN
1.0000 g | Freq: Once | INTRAVENOUS | Status: AC
  Administered 2019-08-25: 1 g via INTRAVENOUS
  Filled 2019-08-25: qty 10

## 2019-08-25 NOTE — ED Notes (Signed)
Daughter updated at this time.   

## 2019-08-25 NOTE — ED Triage Notes (Signed)
Pt arrival via [facility] from [facility] where EMS states that 40 other residents have tested positive this week.   EMS states pt is confused at baseline, but she is a&ox4 with [place]. EMS had her on 2 L in route, pt o2 sat 96% on room air. Pt presents with weakness and slight cough.

## 2019-08-25 NOTE — Discharge Instructions (Addendum)
Thank you for letting us take care of you in the emergency department today.   Please continue to take any regular, prescribed medications.   New medications we have prescribed:  - Keflex - please take for your urinary tract infection.   Please follow up with: - Your primary care doctor to review your ER visit and follow up on your symptoms.   At this time, your coronavirus swab results are pending. You should hear your results in 1-2 days if they are positive.   In the meantime, it is important to take precautions in case you are positive. This includes quarantining, wearing a mask, and social distancing.   Continue to take over the counter acetaminophen and ibuprofen as directed on the box to help with fevers as well as aches and pains.   Please return to the ER for any new or worsening symptoms, such as difficulty breathing, vomiting and diarrhea, or chest pain.

## 2019-08-25 NOTE — ED Notes (Signed)
Pt gives verbal consent for transfer 

## 2019-08-25 NOTE — ED Notes (Signed)
Report called to [facility] at this time

## 2019-08-25 NOTE — ED Provider Notes (Signed)
[facility] Emergency Department Provider Note  ____________________________________________   First MD Initiated Contact with Patient 08/25/19 1747     (approximate)  I have reviewed the triage vital signs and the nursing notes.  History  Chief Complaint Weakness    HPI [name] [name] is a 85 y.o. female with history of CAD, diastolic heart failure, CKD who presents to the emergency department from her living facility for reported generalized weakness.    Her facility is concerned for either coronavirus or UTI, as she has frequent UTIs.  She was reportedly febrile with EMS, temperature 99.4 on arrival without intervention.  The patient complains of congestion and productive cough as well as generalized aches and pains.  She denies any chest pain, shortness of breath, abdominal pain, dysuria, or malodorous urine.  Of note, patient lives at [facility], where there have been a number of COVID positive patients.   Past Medical Hx Past Medical History:  Diagnosis Date  . Blood clot of neck vein   . Chronic diastolic CHF (congestive heart failure) ([place])    a. December 01, 2011 shows normal systolic function, ejection fraction greater than 55%, mild LVH, mild TR, moderate right ventricular systolic pressures.  . Coronary artery disease    a. s/p CABG in [place] - late 90's/early 2000's;  b. s/p stenting  . Diabetes mellitus   . DVT (deep venous thrombosis) ([place])    a. RLE - chronic xarelto.  . Hyperlipidemia   . Hypertension   . Hypothyroidism   . Liver cyst   . PVD (peripheral vascular disease) ([place])     Problem List Patient Active Problem List   Diagnosis Date Noted  . HCAP (healthcare-associated pneumonia) 09/10/2018  . Acute renal failure superimposed on chronic kidney disease ([place]) 09/10/2018  . Hypertensive heart disease 05/15/2014  . Candidal dermatitis 05/15/2014  . Generalized weakness 05/15/2014  . Pressure ulcer, stage 2  ([place]) 05/15/2014  . Neuropathic pain 05/15/2014  . Absence of bladder continence 05/15/2014  . Diabetes mellitus type 2 with complications ([place]) 19/37/9024  . Hypothyroidism 05/14/2014  . Gout, chronic 05/14/2014  . Constipation 05/14/2014  . Coronary artery disease   . Chronic diastolic CHF (congestive heart failure) ([place]) 03/04/2013  . Essential hypertension 12/13/2012  . Tachycardia 12/11/2011  . SOB (shortness of breath) 03/12/2011  . S/P CABG (coronary artery bypass graft) 03/12/2011  . PVD (peripheral vascular disease) ([place]) 03/12/2011  . Hyperlipemia 03/12/2011  . Edema 03/12/2011    Past Surgical Hx Past Surgical History:  Procedure Laterality Date  . ANGIOPLASTY     right leg  . CARDIAC CATHETERIZATION    . CORONARY ARTERY BYPASS GRAFT  1999  . THYROIDECTOMY      Medications Prior to Admission medications   Medication Sig Start Date End Date Taking? Authorizing Provider  allopurinol (ZYLOPRIM) 100 MG tablet Take 100 mg by mouth daily.     [provider]  Amino Acids-Protein Hydrolys (FEEDING SUPPLEMENT, PRO-STAT SUGAR FREE 64,) LIQD Take 30 mLs by mouth 2 (two) times daily.    [provider]  aspirin EC 81 MG tablet Take 81 mg by mouth daily.     [provider]  atorvastatin (LIPITOR) 10 MG tablet Take 10 mg by mouth daily with supper.    [provider]  atorvastatin (LIPITOR) 40 MG tablet Take 1 tablet (40 mg total) by mouth daily. 10/15/15   Minna Merritts, MD  gabapentin (NEURONTIN) 300 MG capsule Take 300  mg by mouth 3 (three) times daily.    [provider]  insulin aspart (NOVOLOG) 100 UNIT/ML injection Inject 0-10 Units into the skin 4 (four) times daily -  before meals and at bedtime. If blood sugar is less than 60, call MD If blood sugar is 201-250, give 2 units If blood sugar is 251-300, give 4 units If blood sugar is 301-350, give 6 units If blood sugar is 351-400, give 8 units If blood sugar is 401-450,  give 10 units If blood sugar is >450, call MD    [provider]  iron polysaccharides (NIFEREX) 150 MG capsule Take 150 mg by mouth daily.     [provider]  isosorbide mononitrate (IMDUR) 30 MG 24 hr tablet TAKE ONE TABLET BY MOUTH EVERY DAY Patient taking differently: Take 30 mg by mouth daily.  01/02/14   Antonieta Iba, MD  LEVEMIR 100 UNIT/ML injection Inject 38 Units into the skin daily.  02/21/13   [provider]  levothyroxine (SYNTHROID, LEVOTHROID) 100 MCG tablet Take 100 mcg by mouth daily before breakfast.    [provider]  losartan (COZAAR) 100 MG tablet Take 100 mg by mouth daily.    [provider]  metFORMIN (GLUCOPHAGE-XR) 750 MG 24 hr tablet Take 750 mg by mouth 2 (two) times daily.     [provider]  metoprolol (LOPRESSOR) 50 MG tablet Take 50 mg by mouth 2 (two) times daily.    [provider]  polyethylene glycol (MIRALAX / GLYCOLAX) packet Take 17 g by mouth daily at 6 PM.     [provider]  vitamin B-12 (CYANOCOBALAMIN) 1000 MCG tablet Take 1,000 mcg by mouth daily.    [provider]    Allergies Acetaminophen and Codeine  Family Hx Family History  Family history unknown: Yes    Social Hx Social History   Tobacco Use  . Smoking status: Former Smoker    Packs/day: 1.00    Types: Cigarettes    Quit date: 03/11/1998    Years since quitting: 21.4  Substance Use Topics  . Alcohol use: No  . Drug use: No     Review of Systems  Constitutional: Negative for fever, chills. + body aches Eyes: Negative for visual changes. ENT: Negative for sore throat. Cardiovascular: Negative for chest pain. Respiratory: + cough Gastrointestinal: Negative for nausea, vomiting.  Genitourinary: Negative for dysuria. Musculoskeletal: Negative for leg swelling. Skin: Negative for rash. Neurological: Negative for for headaches.   Physical Exam  Vital Signs: ED Triage Vitals  Enc  Vitals Group     BP 08/25/19 1738 (!) 120/57     Pulse Rate 08/25/19 1738 70     Resp 08/25/19 1738 20     Temp 08/25/19 1738 99.4 F (37.4 C)     Temp Source 08/25/19 1738 Oral     SpO2 08/25/19 1738 96 %     Weight 08/25/19 1739 165 lb (74.8 kg)     Height 08/25/19 1739 5\' 4"  (1.626 m)     Head Circumference --      Peak Flow --      Pain Score 08/25/19 1739 0     Pain Loc --      Pain Edu? --      Excl. in GC? --     Constitutional: Awake and alert. Head: Normocephalic. Atraumatic. Eyes: Conjunctivae clear. Sclera anicteric. Nose: No congestion. No rhinorrhea. Mouth/Throat: Mucous membranes are slightly dry.  Neck: No stridor.  Cardiovascular: Normal rate. Extremities well perfused. Respiratory: Normal respiratory effort. No hypoxia. Mild, scattered, coarse lung sounds. Cough during exam. Gastrointestinal: Soft. Non-tender. Non-distended.  Musculoskeletal: No lower extremity edema. No deformities. Neurologic:  Normal speech and language. No gross focal neurologic deficits are appreciated.  Skin: Skin is warm, dry and intact. No rash noted. Psychiatric: Mood and affect are appropriate for situation.  EKG  Personally reviewed.   Rate: 67 Rhythm: sinus Axis: normal Intervals: WNL Minimal 1 mm ST elevation in inferior leads, seen previously Appears unchanged from prior    Radiology  XR: IMPRESSION:  1. Cardiomegaly with central vascular congestion and perihilar and  bibasilar interstitial opacities. Findings suggest CHF with  interstitial edema. Superimposed pneumonia cannot be entirely  excluded.  2. Possible trace left pleural effusion.    Procedures  Procedure(s) performed (including critical care):  Procedures   Initial Impression / Assessment and Plan / ED Course  85 y.o. female who presents to the ED for fever, body aches, generalized weakness  Ddx: UTI, COVID, other viral process  Plan: labs, swab, XR  Work-up reveals overwhelming UTI on  urinalysis.  Creatinine near baseline.  Hyperglycemia, but no evidence of gap.  CXR as above.    Will give dose of antibiotics for her UTI and then plan for discharge back to her facility with a course of Keflex, given she is hemodynamically stable, in no acute respiratory distress, not requiring any supplemental oxygen, and the remainder of her labs are without actionable derangements. Her COVID swab is pending at this time.  Explained the need for proper social distancing and quarantine until the swab has resulted.   Final Clinical Impression(s) / ED Diagnosis  Final diagnoses:  Generalized weakness  Urinary tract infection in elderly patient  Suspected 2019-nCoV infection       Note:  This document was prepared using Dragon voice recognition software and may include unintentional dictation errors.   Miguel AschoffMonks, Katti Pelle L., MD 08/25/19 2142

## 2019-08-26 LAB — SARS CORONAVIRUS 2 (TAT 6-24 HRS): SARS Coronavirus 2: POSITIVE — AB

## 2019-08-26 NOTE — ED Notes (Signed)
Positive covid result called to jerry at [employer].

## 2019-08-27 LAB — URINE CULTURE: Culture: >=100000 — AB

## 2019-08-30 LAB — CULTURE, BLOOD (ROUTINE X 2)
Culture: NO GROWTH
Culture: NO GROWTH
Special Requests: ADEQUATE

## 2019-09-05 ENCOUNTER — Encounter: Payer: Self-pay | Admitting: Emergency Medicine

## 2019-09-05 ENCOUNTER — Inpatient Hospital Stay
Admission: EM | Admit: 2019-09-05 | Discharge: 2019-09-07 | DRG: 177 | Disposition: A | Discharge status: Skilled Nursing Facility | Payer: Medicare HMO | Attending: Internal Medicine | Admitting: Internal Medicine

## 2019-09-05 ENCOUNTER — Emergency Department: Payer: Medicare HMO

## 2019-09-05 ENCOUNTER — Other Ambulatory Visit: Payer: Self-pay

## 2019-09-05 DIAGNOSIS — Z7982 Long term (current) use of aspirin: Secondary | ICD-10-CM

## 2019-09-05 DIAGNOSIS — R4182 Altered mental status, unspecified: Secondary | ICD-10-CM | POA: Diagnosis not present

## 2019-09-05 DIAGNOSIS — E890 Postprocedural hypothyroidism: Secondary | ICD-10-CM | POA: Diagnosis present

## 2019-09-05 DIAGNOSIS — Z886 Allergy status to analgesic agent status: Secondary | ICD-10-CM | POA: Diagnosis not present

## 2019-09-05 DIAGNOSIS — N179 Acute kidney failure, unspecified: Secondary | ICD-10-CM | POA: Diagnosis present

## 2019-09-05 DIAGNOSIS — E1151 Type 2 diabetes mellitus with diabetic peripheral angiopathy without gangrene: Secondary | ICD-10-CM | POA: Diagnosis present

## 2019-09-05 DIAGNOSIS — M2550 Pain in unspecified joint: Secondary | ICD-10-CM | POA: Diagnosis not present

## 2019-09-05 DIAGNOSIS — Z7901 Long term (current) use of anticoagulants: Secondary | ICD-10-CM

## 2019-09-05 DIAGNOSIS — Z951 Presence of aortocoronary bypass graft: Secondary | ICD-10-CM

## 2019-09-05 DIAGNOSIS — Z66 Do not resuscitate: Secondary | ICD-10-CM | POA: Diagnosis not present

## 2019-09-05 DIAGNOSIS — G9349 Other encephalopathy: Secondary | ICD-10-CM | POA: Diagnosis not present

## 2019-09-05 DIAGNOSIS — I130 Hypertensive heart and chronic kidney disease with heart failure and stage 1 through stage 4 chronic kidney disease, or unspecified chronic kidney disease: Secondary | ICD-10-CM | POA: Diagnosis not present

## 2019-09-05 DIAGNOSIS — J9601 Acute respiratory failure with hypoxia: Secondary | ICD-10-CM | POA: Diagnosis present

## 2019-09-05 DIAGNOSIS — I5032 Chronic diastolic (congestive) heart failure: Secondary | ICD-10-CM | POA: Diagnosis not present

## 2019-09-05 DIAGNOSIS — Z7401 Bed confinement status: Secondary | ICD-10-CM | POA: Diagnosis not present

## 2019-09-05 DIAGNOSIS — E1122 Type 2 diabetes mellitus with diabetic chronic kidney disease: Secondary | ICD-10-CM | POA: Diagnosis present

## 2019-09-05 DIAGNOSIS — I959 Hypotension, unspecified: Secondary | ICD-10-CM | POA: Diagnosis not present

## 2019-09-05 DIAGNOSIS — Z794 Long term (current) use of insulin: Secondary | ICD-10-CM

## 2019-09-05 DIAGNOSIS — U071 COVID-19: Secondary | ICD-10-CM | POA: Diagnosis not present

## 2019-09-05 DIAGNOSIS — T68XXXA Hypothermia, initial encounter: Secondary | ICD-10-CM | POA: Diagnosis not present

## 2019-09-05 DIAGNOSIS — M109 Gout, unspecified: Secondary | ICD-10-CM | POA: Diagnosis present

## 2019-09-05 DIAGNOSIS — E785 Hyperlipidemia, unspecified: Secondary | ICD-10-CM | POA: Diagnosis present

## 2019-09-05 DIAGNOSIS — I517 Cardiomegaly: Secondary | ICD-10-CM | POA: Diagnosis not present

## 2019-09-05 DIAGNOSIS — Z515 Encounter for palliative care: Secondary | ICD-10-CM | POA: Diagnosis not present

## 2019-09-05 DIAGNOSIS — I2510 Atherosclerotic heart disease of native coronary artery without angina pectoris: Secondary | ICD-10-CM | POA: Diagnosis present

## 2019-09-05 DIAGNOSIS — R069 Unspecified abnormalities of breathing: Secondary | ICD-10-CM | POA: Diagnosis not present

## 2019-09-05 DIAGNOSIS — R404 Transient alteration of awareness: Secondary | ICD-10-CM | POA: Diagnosis not present

## 2019-09-05 DIAGNOSIS — Z885 Allergy status to narcotic agent status: Secondary | ICD-10-CM

## 2019-09-05 DIAGNOSIS — Z79890 Hormone replacement therapy (postmenopausal): Secondary | ICD-10-CM | POA: Diagnosis not present

## 2019-09-05 DIAGNOSIS — I252 Old myocardial infarction: Secondary | ICD-10-CM

## 2019-09-05 DIAGNOSIS — R52 Pain, unspecified: Secondary | ICD-10-CM | POA: Diagnosis not present

## 2019-09-05 DIAGNOSIS — Z87891 Personal history of nicotine dependence: Secondary | ICD-10-CM | POA: Diagnosis not present

## 2019-09-05 DIAGNOSIS — J1289 Other viral pneumonia: Secondary | ICD-10-CM | POA: Diagnosis present

## 2019-09-05 DIAGNOSIS — Z7189 Other specified counseling: Secondary | ICD-10-CM | POA: Diagnosis not present

## 2019-09-05 DIAGNOSIS — N189 Chronic kidney disease, unspecified: Secondary | ICD-10-CM | POA: Diagnosis present

## 2019-09-05 DIAGNOSIS — Z86718 Personal history of other venous thrombosis and embolism: Secondary | ICD-10-CM

## 2019-09-05 LAB — COMPREHENSIVE METABOLIC PANEL
ALT: 17 U/L (ref 0–44)
AST: 27 U/L (ref 15–41)
Albumin: 3.5 g/dL (ref 3.5–5.0)
Alkaline Phosphatase: 136 U/L — ABNORMAL HIGH (ref 38–126)
Anion gap: 15 (ref 5–15)
BUN: 122 mg/dL — ABNORMAL HIGH (ref 8–23)
CO2: 19 mmol/L — ABNORMAL LOW (ref 22–32)
Calcium: 9.6 mg/dL (ref 8.9–10.3)
Chloride: 114 mmol/L — ABNORMAL HIGH (ref 98–111)
Creatinine, Ser: 4.51 mg/dL — ABNORMAL HIGH (ref 0.44–1.00)
GFR calc Af Amer: 9 mL/min — ABNORMAL LOW (ref >60)
GFR calc non Af Amer: 8 mL/min — ABNORMAL LOW (ref >60)
Glucose, Bld: 200 mg/dL — ABNORMAL HIGH (ref 70–99)
Potassium: 4.9 mmol/L (ref 3.5–5.1)
Sodium: 148 mmol/L — ABNORMAL HIGH (ref 135–145)
Total Bilirubin: 1.1 mg/dL (ref 0.3–1.2)
Total Protein: 8.6 g/dL — ABNORMAL HIGH (ref 6.5–8.1)

## 2019-09-05 LAB — CBC WITH DIFFERENTIAL/PLATELET
Abs Immature Granulocytes: 0.06 10*3/uL (ref 0.00–0.07)
Basophils Absolute: 0 10*3/uL (ref 0.0–0.1)
Basophils Relative: 0 %
Eosinophils Absolute: 0.1 10*3/uL (ref 0.0–0.5)
Eosinophils Relative: 1 %
HCT: 34.3 % — ABNORMAL LOW (ref 36.0–46.0)
Hemoglobin: 11.5 g/dL — ABNORMAL LOW (ref 12.0–15.0)
Immature Granulocytes: 1 %
Lymphocytes Relative: 19 %
Lymphs Abs: 1.8 10*3/uL (ref 0.7–4.0)
MCH: 33.8 pg (ref 26.0–34.0)
MCHC: 33.5 g/dL (ref 30.0–36.0)
MCV: 100.9 fL — ABNORMAL HIGH (ref 80.0–100.0)
Monocytes Absolute: 0.6 10*3/uL (ref 0.1–1.0)
Monocytes Relative: 6 %
Neutro Abs: 6.9 10*3/uL (ref 1.7–7.7)
Neutrophils Relative %: 73 %
Platelets: 385 10*3/uL (ref 150–400)
RBC: 3.4 MIL/uL — ABNORMAL LOW (ref 3.87–5.11)
RDW: 12.5 % (ref 11.5–15.5)
WBC: 9.5 10*3/uL (ref 4.0–10.5)
nRBC: 0 % (ref 0.0–0.2)

## 2019-09-05 LAB — INFLUENZA PANEL BY PCR (TYPE A & B)
Influenza A By PCR: NEGATIVE
Influenza B By PCR: NEGATIVE

## 2019-09-05 LAB — FIBRIN DERIVATIVES D-DIMER (ARMC ONLY): Fibrin derivatives D-dimer (ARMC): >10000 ng/mL (FEU) — ABNORMAL HIGH (ref 0.00–499.00)

## 2019-09-05 MED ORDER — METHYLPREDNISOLONE SODIUM SUCC 40 MG IJ SOLR
40.0000 mg | Freq: Two times a day (BID) | INTRAMUSCULAR | Status: DC
  Administered 2019-09-06 – 2019-09-07 (×4): 40 mg via INTRAVENOUS
  Filled 2019-09-05 (×4): qty 1

## 2019-09-05 MED ORDER — ENOXAPARIN SODIUM 30 MG/0.3ML ~~LOC~~ SOLN
30.0000 mg | SUBCUTANEOUS | Status: DC
  Administered 2019-09-06: 30 mg via SUBCUTANEOUS
  Filled 2019-09-05 (×2): qty 0.3

## 2019-09-05 MED ORDER — SODIUM CHLORIDE 0.9 % IV SOLN: Freq: Once | INTRAVENOUS | Status: DC

## 2019-09-05 MED ORDER — FENTANYL CITRATE (PF) 100 MCG/2ML IJ SOLN
25.0000 ug | INTRAMUSCULAR | Status: DC | PRN
  Administered 2019-09-06: 25 ug via INTRAVENOUS
  Filled 2019-09-05: qty 2

## 2019-09-05 NOTE — ED Provider Notes (Signed)
[facility] Emergency Department Provider Note    First MD Initiated Contact with Patient 09/05/19 1816     (approximate)  I have reviewed the triage vital signs and the nursing notes.   HISTORY  Chief Complaint Altered Mental Status  Level V Caveat:  AMS  HPI [name] [name] is a 85 y.o. female recent COVID-26 diagnosis presents to the ER for altered mental status confusion.  Unable to provide any additional history.  Patient was found to have low O2 sats around 88% to 89% placed on 2 L nasal cannula.  Patient states that she is hurting all over and just moaning.  Does arrive with a MOST form showing that she is a DNR/DNI.    Past Medical History:  Diagnosis Date  . Blood clot of neck vein   . Chronic diastolic CHF (congestive heart failure) ([place])    a. December 01, 2011 shows normal systolic function, ejection fraction greater than 55%, mild LVH, mild TR, moderate right ventricular systolic pressures.  . Coronary artery disease    a. s/p CABG in [place] - late 90's/early 2000's;  b. s/p stenting  . Diabetes mellitus   . DVT (deep venous thrombosis) ([place])    a. RLE - chronic xarelto.  . Hyperlipidemia   . Hypertension   . Hypothyroidism   . Liver cyst   . PVD (peripheral vascular disease) ([place])    Family History  Family history unknown: Yes   Past Surgical History:  Procedure Laterality Date  . ANGIOPLASTY     right leg  . CARDIAC CATHETERIZATION    . CORONARY ARTERY BYPASS GRAFT  1999  . THYROIDECTOMY     Patient Active Problem List   Diagnosis Date Noted  . COVID-19 virus infection 09/05/2019  . HCAP (healthcare-associated pneumonia) 09/10/2018  . Acute renal failure superimposed on chronic kidney disease ([place]) 09/10/2018  . Hypertensive heart disease 05/15/2014  . Candidal dermatitis 05/15/2014  . Generalized weakness 05/15/2014  . Pressure ulcer, stage 2 ([place]) 05/15/2014  . Neuropathic pain 05/15/2014  . Absence of bladder  continence 05/15/2014  . Diabetes mellitus type 2 with complications ([place]) 14/97/0263  . Hypothyroidism 05/14/2014  . Gout, chronic 05/14/2014  . Constipation 05/14/2014  . Coronary artery disease   . Chronic diastolic CHF (congestive heart failure) ([place]) 03/04/2013  . Essential hypertension 12/13/2012  . Tachycardia 12/11/2011  . SOB (shortness of breath) 03/12/2011  . S/P CABG (coronary artery bypass graft) 03/12/2011  . PVD (peripheral vascular disease) ([place]) 03/12/2011  . Hyperlipemia 03/12/2011  . Edema 03/12/2011      Prior to Admission medications   Medication Sig Start Date End Date Taking? Authorizing Provider  allopurinol (ZYLOPRIM) 100 MG tablet Take 100 mg by mouth daily.    Yes [provider]  Amino Acids-Protein Hydrolys (FEEDING SUPPLEMENT, PRO-STAT SUGAR FREE 64,) LIQD Take 30 mLs by mouth 2 (two) times daily.   Yes [provider]  amLODipine (NORVASC) 5 MG tablet Take 5 mg by mouth daily.   Yes [provider]  atorvastatin (LIPITOR) 40 MG tablet Take 1 tablet (40 mg total) by mouth daily. 10/15/15  Yes Minna Merritts, MD  cetirizine (ZYRTEC) 10 MG tablet Take 10 mg by mouth daily.   Yes [provider]  gabapentin (NEURONTIN) 300 MG capsule Take 300 mg by mouth 3 (three) times daily.   Yes [provider]  iron polysaccharides (NIFEREX) 150 MG capsule Take 150 mg by mouth 2 (two) times daily.  Yes [provider]  isosorbide mononitrate (IMDUR) 30 MG 24 hr tablet TAKE ONE TABLET BY MOUTH EVERY DAY Patient taking differently: Take 30 mg by mouth daily.  01/02/14  Yes Gollan, Tollie Pizza, MD  levothyroxine (SYNTHROID, LEVOTHROID) 100 MCG tablet Take 100 mcg by mouth daily before breakfast.   Yes [provider]  losartan (COZAAR) 100 MG tablet Take 100 mg by mouth daily.   Yes [provider]  metFORMIN (GLUCOPHAGE-XR) 500 MG 24 hr tablet Take 500 mg by mouth daily with breakfast.    Yes  [provider]  metoprolol (LOPRESSOR) 50 MG tablet Take 50 mg by mouth 2 (two) times daily.   Yes [provider]  Multiple Vitamins-Minerals (MULTIVITAMIN WITH MINERALS) tablet Take 1 tablet by mouth daily.   Yes [provider]  polyethylene glycol (MIRALAX / GLYCOLAX) packet Take 17 g by mouth daily at 6 PM.    Yes [provider]  sennosides-docusate sodium (SENOKOT-S) 8.6-50 MG tablet Take 1 tablet by mouth 2 (two) times daily.   Yes [provider]  sitaGLIPtin (JANUVIA) 100 MG tablet Take 100 mg by mouth daily.   Yes [provider]  vitamin B-12 (CYANOCOBALAMIN) 1000 MCG tablet Take 1,000 mcg by mouth daily.   Yes [provider]  ondansetron (ZOFRAN) 4 MG tablet Take 4 mg by mouth every 8 (eight) hours as needed for nausea/vomiting. 08/18/19   [provider]    Allergies Acetaminophen and Codeine    Social History Social History   Tobacco Use  . Smoking status: Former Smoker    Packs/day: 1.00    Types: Cigarettes    Quit date: 03/11/1998    Years since quitting: 21.5  . Smokeless tobacco: Never Used  Substance Use Topics  . Alcohol use: No  . Drug use: No    Review of Systems Patient denies headaches, rhinorrhea, blurry vision, numbness, shortness of breath, chest pain, edema, cough, abdominal pain, nausea, vomiting, diarrhea, dysuria, fevers, rashes or hallucinations unless otherwise stated above in HPI. ____________________________________________   PHYSICAL EXAM:  VITAL SIGNS: Vitals:   09/05/19 1830 09/05/19 1900  BP: 94/60 110/63  Pulse: 85   Resp: (!) 26 20  Temp:    SpO2: 94%     Constitutional: Alert, protecting airway but disoriented x 3 Eyes: Conjunctivae are normal.  Head: Atraumatic. Nose: No congestion/rhinnorhea. Mouth/Throat: Mucous membranes are dry.   Neck: No stridor. Painless ROM.  Cardiovascular: Normal rate, regular rhythm. Grossly normal heart sounds.  Good  peripheral circulation. Respiratory: Normal respiratory effort.  No retractions. Lungs with coarse bibasilar breathsuonds Gastrointestinal: Soft and nontender. No distention. No abdominal bruits. No CVA tenderness. Genitourinary:  Musculoskeletal: No lower extremity tenderness nor edema.  No joint effusions. Neurologic:   No gross focal neurologic deficits are appreciated. No facial droop Skin:  Skin is warm, dry and intact. No rash noted. Psychiatric: unable to assess 2/2 ams  ____________________________________________   LABS (all labs ordered are listed, but only abnormal results are displayed)  Results for orders placed or performed during the hospital encounter of 09/05/19 (from the past 24 hour(s))  CBC with Differential/Platelet     Status: Abnormal   Collection Time: 09/05/19  6:46 PM  Result Value Ref Range   WBC 9.5 4.0 - 10.5 K/uL   RBC 3.40 (L) 3.87 - 5.11 MIL/uL   Hemoglobin 11.5 (L) 12.0 - 15.0 g/dL   HCT 16.1 (L) 09.6 - 04.5 %   MCV 100.9 (H) 80.0 -  100.0 fL   MCH 33.8 26.0 - 34.0 pg   MCHC 33.5 30.0 - 36.0 g/dL   RDW 81.112.5 91.411.5 - 78.215.5 %   Platelets 385 150 - 400 K/uL   nRBC 0.0 0.0 - 0.2 %   Neutrophils Relative % 73 %   Neutro Abs 6.9 1.7 - 7.7 K/uL   Lymphocytes Relative 19 %   Lymphs Abs 1.8 0.7 - 4.0 K/uL   Monocytes Relative 6 %   Monocytes Absolute 0.6 0.1 - 1.0 K/uL   Eosinophils Relative 1 %   Eosinophils Absolute 0.1 0.0 - 0.5 K/uL   Basophils Relative 0 %   Basophils Absolute 0.0 0.0 - 0.1 K/uL   Immature Granulocytes 1 %   Abs Immature Granulocytes 0.06 0.00 - 0.07 K/uL  Comprehensive metabolic panel     Status: Abnormal   Collection Time: 09/05/19  6:46 PM  Result Value Ref Range   Sodium 148 (H) 135 - 145 mmol/L   Potassium 4.9 3.5 - 5.1 mmol/L   Chloride 114 (H) 98 - 111 mmol/L   CO2 19 (L) 22 - 32 mmol/L   Glucose, Bld 200 (H) 70 - 99 mg/dL   BUN 956122 (H) 8 - 23 mg/dL   Creatinine, Ser 2.134.51 (H) 0.44 - 1.00 mg/dL   Calcium 9.6 8.9 - 08.610.3  mg/dL   Total Protein 8.6 (H) 6.5 - 8.1 g/dL   Albumin 3.5 3.5 - 5.0 g/dL   AST 27 15 - 41 U/L   ALT 17 0 - 44 U/L   Alkaline Phosphatase 136 (H) 38 - 126 U/L   Total Bilirubin 1.1 0.3 - 1.2 mg/dL   GFR calc non Af Amer 8 (L) >60 mL/min   GFR calc Af Amer 9 (L) >60 mL/min   Anion gap 15 5 - 15   ____________________________________________ ____________________________________________  RADIOLOGY  I personally reviewed all radiographic images ordered to evaluate for the above acute complaints and reviewed radiology reports and findings.  These findings were personally discussed with the patient.  Please see medical record for radiology report.  ____________________________________________   PROCEDURES  Procedure(s) performed:  .Critical Care Performed by: Willy Eddyobinson, Brittney Mucha, MD Authorized by: Willy Eddyobinson, Jovaun Levene, MD   Critical care provider statement:    Critical care time (minutes):  30   Critical care time was exclusive of:  Separately billable procedures and treating other patients   Critical care was necessary to treat or prevent imminent or life-threatening deterioration of the following conditions:  Renal failure   Critical care was time spent personally by me on the following activities:  Development of treatment plan with patient or surrogate, discussions with consultants, evaluation of patient's response to treatment, examination of patient, obtaining history from patient or surrogate, ordering and performing treatments and interventions, ordering and review of laboratory studies, ordering and review of radiographic studies, pulse oximetry, re-evaluation of patient's condition and review of old charts      Critical Care performed: yes ____________________________________________   INITIAL IMPRESSION / ASSESSMENT AND PLAN / ED COURSE  Pertinent labs & imaging results that were available during my care of the patient were reviewed by me and considered in my medical  decision making (see chart for details).   DDX: sepsis, covid 19, pna, renal failure  Damita DunningsBeatrice B Alonso is a 85 y.o. who presents to the ED with symptoms as described above.  Patient afebrile but does have evidence of acute respiratory failure with hypoxia.  Blood work was sent for by differential.  Patient very ill-appearing but protecting her airway.  Arrives with MOST form suggesting DNR but agreeable to IV fluids, antibiotics otherwise elected to proceed with comfort care.  Will reach out to family and POA to confirm.  Clinical Course as of Sep 04 2044  Mon Sep 05, 2019  5284 Patient with evidence of acute renal failure and uremia which would explain the patient's altered mental status.  This is in the setting of COVID-19.  Very poor prognosis.  Will discuss with family.    [PR]  1958 Discussed case work-up and plan of care with family.  Discussed my concern for very poor prognosis and recommendation of make sure the patient is comfortable.  They agree that she would agree with IV fluids and antibiotics as indicated but would not want any escalation of care or invasive procedures.  She appears comfortable at this time.  Will discuss with hospitalist for admission for comfort measures.   [PR]    Clinical Course User Index [PR] Willy Eddy, MD    The patient was evaluated in Emergency Department today for the symptoms described in the history of present illness. He/she was evaluated in the context of the global COVID-19 pandemic, which necessitated consideration that the patient might be at risk for infection with the SARS-CoV-2 virus that causes COVID-19. Institutional protocols and algorithms that pertain to the evaluation of patients at risk for COVID-19 are in a state of rapid change based on information released by regulatory bodies including the CDC and federal and state organizations. These policies and algorithms were followed during the patient's care in the ED.  As part of my  medical decision making, I reviewed the following data within the electronic MEDICAL RECORD NUMBER Nursing notes reviewed and incorporated, Labs reviewed, notes from prior ED visits and [place] Controlled Substance Database   ____________________________________________   FINAL CLINICAL IMPRESSION(S) / ED DIAGNOSES  Final diagnoses:  Altered mental status, unspecified altered mental status type  Acute renal failure, unspecified acute renal failure type (HCC)      NEW MEDICATIONS STARTED DURING THIS VISIT:  New Prescriptions   No medications on file     Note:  This document was prepared using Dragon voice recognition software and may include unintentional dictation errors.    Willy Eddy, MD 09/05/19 2046

## 2019-09-05 NOTE — H&P (Signed)
[facility] NAME: [name] [name]    MR#:  [id number]  DATE OF BIRTH:  21-Jul-1925  DATE OF ADMISSION:  09/05/2019  PRIMARY CARE PHYSICIAN: Juluis Pitch, MD   REQUESTING/REFERRING PHYSICIAN: Merlyn Lot, MD  CHIEF COMPLAINT:   Chief Complaint  Patient presents with  . Altered Mental Status    HISTORY OF PRESENT ILLNESS:   85 year old female with pertinent past medical history of CAD s/p CABG, diabetes mellitus, DVT, hyperlipidemia, hypertension, hypothyroidism, liver cyst, PVD, chronic diastolic CHF, CKD, and central retinal artery occlusion presenting to the ED with altered mental status and hypoxia.  Patient initially presented to the ED on 08/25/2019 from [facility] with altered mental status.  Apparently over 40 other residents had tested positive that week.  Staff from the facility were concerned for either coronavirus or UTI, as she has frequent UTI.  She was reportedly febrile with EMS temperature 99.4 on arrival without intervention.  Patient complained of congestion and productive cough as well as generalized aches and pain.  Denied chest pain, shortness of breath, abdominal pain dysuria or any other urinary symptoms.  Work-up in the ED revealed overwhelming UTI on urinalysis patient was treated for UTI and discharged back to her facility with a course of Keflex.  Her Covid swab was pending at that time.  She presented back today with worsening confusion and hypoxia sats around 88% to 89 on room air she was placed on 2 L nasal cannula.  Her COVID-19 test has been confirmed positive  ED Course:On arrival to the ED, she was afebrile with blood pressure 103/64 mm Hg and pulse rate 92 beats/min. There were no focal neurological deficits; she was unresponsive to verbal or tactile stimuli.  Oxygen saturation was noted that 88% on room air.  Labs revealed WBC 9.5, hemoglobin 11.5,, glucose 200, BUN 122, creatinine 4.51,  alk phos 136.  Chest x-ray from 08/25/2019 showed central vascular congestion and bibasilar interstitial opacity consistent with CHF with interstitial edema and likely superimposed pneumonia.  Repeat chest x-ray today shows  PAST MEDICAL HISTORY:   Past Medical History:  Diagnosis Date  . Blood clot of neck vein   . Chronic diastolic CHF (congestive heart failure) ([place])    a. December 01, 2011 shows normal systolic function, ejection fraction greater than 55%, mild LVH, mild TR, moderate right ventricular systolic pressures.  . Coronary artery disease    a. s/p CABG in [place] - late 90's/early 2000's;  b. s/p stenting  . Diabetes mellitus   . DVT (deep venous thrombosis) ([place])    a. RLE - chronic xarelto.  . Hyperlipidemia   . Hypertension   . Hypothyroidism   . Liver cyst   . PVD (peripheral vascular disease) ([place])     PAST SURGICAL HISTORY:   Past Surgical History:  Procedure Laterality Date  . ANGIOPLASTY     right leg  . CARDIAC CATHETERIZATION    . CORONARY ARTERY BYPASS GRAFT  1999  . THYROIDECTOMY      SOCIAL HISTORY:   Social History   Tobacco Use  . Smoking status: Former Smoker    Packs/day: 1.00    Types: Cigarettes    Quit date: 03/11/1998    Years since quitting: 21.5  . Smokeless tobacco: Never Used  Substance Use Topics  . Alcohol use: No    FAMILY HISTORY:   Family History  Family history unknown: Yes    DRUG ALLERGIES:   Allergies  Allergen Reactions  . Acetaminophen     Upset stomach  . Codeine     REVIEW OF SYSTEMS:   Review of Systems  Unable to perform ROS: Mental status change    MEDICATIONS AT HOME:   Prior to Admission medications   Medication Sig Start Date End Date Taking? Authorizing Provider  allopurinol (ZYLOPRIM) 100 MG tablet Take 100 mg by mouth daily.     [provider]  Amino Acids-Protein Hydrolys (FEEDING SUPPLEMENT, PRO-STAT SUGAR FREE 64,) LIQD Take 30 mLs by mouth 2 (two) times daily.    [provider]  aspirin EC 81 MG tablet Take 81 mg by mouth daily.     [provider]  atorvastatin (LIPITOR) 10 MG tablet Take 10 mg by mouth daily with supper.    [provider]  atorvastatin (LIPITOR) 40 MG tablet Take 1 tablet (40 mg total) by mouth daily. 10/15/15   Minna Merritts, MD  gabapentin (NEURONTIN) 300 MG capsule Take 300 mg by mouth 3 (three) times daily.    [provider]  insulin aspart (NOVOLOG) 100 UNIT/ML injection Inject 0-10 Units into the skin 4 (four) times daily -  before meals and at bedtime. If blood sugar is less than 60, call MD If blood sugar is 201-250, give 2 units If blood sugar is 251-300, give 4 units If blood sugar is 301-350, give 6 units If blood sugar is 351-400, give 8 units If blood sugar is 401-450, give 10 units If blood sugar is >450, call MD    [provider]  iron polysaccharides (NIFEREX) 150 MG capsule Take 150 mg by mouth daily.     [provider]  isosorbide mononitrate (IMDUR) 30 MG 24 hr tablet TAKE ONE TABLET BY MOUTH EVERY DAY Patient taking differently: Take 30 mg by mouth daily.  01/02/14   Minna Merritts, MD  LEVEMIR 100 UNIT/ML injection Inject 38 Units into the skin daily.  02/21/13   [provider]  levothyroxine (SYNTHROID, LEVOTHROID) 100 MCG tablet Take 100 mcg by mouth daily before breakfast.    [provider]  losartan (COZAAR) 100 MG tablet Take 100 mg by mouth daily.    [provider]  metFORMIN (GLUCOPHAGE-XR) 750 MG 24 hr tablet Take 750 mg by mouth 2 (two) times daily.     [provider]  metoprolol (LOPRESSOR) 50 MG tablet Take 50 mg by mouth 2 (two) times daily.    [provider]  polyethylene glycol (MIRALAX / GLYCOLAX) packet Take 17 g by mouth daily at 6 PM.     [provider]  vitamin B-12 (CYANOCOBALAMIN) 1000 MCG tablet Take 1,000 mcg by mouth daily.    [provider]      VITAL SIGNS:  Blood  pressure 103/64, pulse 92, temperature 98.4 F (36.9 C), temperature source Axillary, resp. rate 20, height _0  (1.626 m), weight 75 kg, SpO2 (S) (!) 88 %.  PHYSICAL EXAMINATION:   Physical Exam  GENERAL:  85 y.o.-year-old patient lying in the bed with no acute distress.  EYES: Pupils equal, round, reactive to light and accommodation. No scleral icterus. Extraocular muscles intact.  HEENT: Head atraumatic, normocephalic. Oropharynx and nasopharynx clear.  NECK:  Supple, no jugular venous distention. No thyroid enlargement, no tenderness.  LUNGS: Normal breath sounds bilaterally, no wheezing, rales,rhonchi or crepitation. No use of accessory muscles of respiration.  CARDIOVASCULAR: S1, S2 normal. No murmurs, rubs, or gallops.  ABDOMEN: Soft, nontender, nondistended. Bowel  sounds present. No organomegaly or mass.  EXTREMITIES: No pedal edema, cyanosis, or clubbing.  NEUROLOGIC: Cranial nerves II through XII are intact. Withdraws extremities to noxious stimuli. Sensation intact. Gait not checked.  PSYCHIATRIC: Unable to assess SKIN: No obvious rash, lesion, or ulcer.   DATA REVIEWED:  LABORATORY PANEL:   CBC Recent Labs  Lab 09/05/19 1846  WBC 9.5  HGB 11.5*  HCT 34.3*  PLT 385   ------------------------------------------------------------------------------------------------------------------  Chemistries  Recent Labs  Lab 09/05/19 1846  NA 148*  K 4.9  CL 114*  CO2 19*  GLUCOSE 200*  BUN 122*  CREATININE 4.51*  CALCIUM 9.6  AST 27  ALT 17  ALKPHOS 136*  BILITOT 1.1   ------------------------------------------------------------------------------------------------------------------  Cardiac Enzymes No results for input(s): TROPONINI in the last 168 hours. ------------------------------------------------------------------------------------------------------------------  RADIOLOGY:  No results found.  EKG:  EKG: unchanged from previous tracings.  IMPRESSION  AND PLAN:   85 y.o. female with pertinent past medical history of CAD s/p CABG, diabetes mellitus, DVT, hyperlipidemia, hypertension, hypothyroidism, liver cyst, PVD, chronic diastolic CHF, CKD, and central retinal artery occlusion presenting to the ED with altered mental status and hypoxia.  1.  Acute respiratory failure with hypoxia  -secondary to pneumonia due to COVID-19 illness - Admit to MedSurg unit - Patient reports fevers, cough, and SOB since 10/15, was positive for COVID-19 the following day, has become progressively dyspneic, and is found hypoxic requiring supplemental O2 requirement,  - CXR-findings consistent with  PNA  - Blood cultures  Pending - Check procalcitonin - Check/trend inflammatory markers, D-dimer consider antibiotics if procalcitonin elevated, continue Decadron - ID consult - Palliative consult.  2. Altered mental status -likely multifactorial in the setting of acute renal failure with uremia, respiratory failure with hypoxia secondary to possible pneumonia from Covid illness  3. Acute renal failure with uremia superimposed on CKD - BUN/creatinine elevated above baseline - Hold nephrotoxins - Monitor renal function  4. Chronic diastolic CHF - Last Echo - Continue Imdur, metoprolol - Holding losartan in the setting of AKI  5. CAD - MI in the past + CABG  - HTN, HLD, DM control as below  6. HLD + Goal LDL<100 - Atorvastatin 68m PO qhs  7. HTN  + Goal BP <130/80 - Continue amlodipine - Continue metoprolol - Hold losartan in the setting of AKI  8. Diabetes mellitus type 2 - Hold Metformin  - Continue Sitagliptin - SSI   Discussed with patient's daughter RDolly Riasregarding patient's current medical condition with poor prognosis.  At this time she would like to proceed with continuing current treatment until tomorrow morning.  She would like to meet with palliative team in the morning and discuss possible end of life care/comfort  measures.  She however does not want to escalate care in the event that patient's condition worsens/deteriorates overnight.   All the records are reviewed and case discussed with ED provider. Management plans discussed with the patient, family and they are in agreement.  CODE STATUS: DNR  TOTAL TIME TAKING CARE OF THIS PATIENT: 50 minutes.    on 09/05/2019 at 8:05 PM   ERufina Falco DNP, FNP-BC [facility] Nurse Practitioner Between 7am to 6pm - Pager -[phone] After 6pm go to [email or web address] - password [email or web address]  Office  [phone] CC: Primary care physician; BJuluis Pitch MD

## 2019-09-05 NOTE — ED Notes (Signed)
Secretary to call for hospital bed.

## 2019-09-05 NOTE — ED Triage Notes (Signed)
Pt presents to ED via [facility] from [facility] c/o AMS per staff. Pt is alert, oriented to self only. Pt does not answer any questions during triage but moans continuously. COVID POSITIVE as of 08/25/19.

## 2019-09-06 DIAGNOSIS — Z7189 Other specified counseling: Secondary | ICD-10-CM

## 2019-09-06 DIAGNOSIS — U071 COVID-19: Principal | ICD-10-CM

## 2019-09-06 DIAGNOSIS — Z515 Encounter for palliative care: Secondary | ICD-10-CM

## 2019-09-06 LAB — CBC WITH DIFFERENTIAL/PLATELET
Abs Immature Granulocytes: 0.08 10*3/uL — ABNORMAL HIGH (ref 0.00–0.07)
Basophils Absolute: 0 10*3/uL (ref 0.0–0.1)
Basophils Relative: 0 %
Eosinophils Absolute: 0 10*3/uL (ref 0.0–0.5)
Eosinophils Relative: 0 %
HCT: 33 % — ABNORMAL LOW (ref 36.0–46.0)
Hemoglobin: 11.2 g/dL — ABNORMAL LOW (ref 12.0–15.0)
Immature Granulocytes: 1 %
Lymphocytes Relative: 7 %
Lymphs Abs: 0.8 10*3/uL (ref 0.7–4.0)
MCH: 33.3 pg (ref 26.0–34.0)
MCHC: 33.9 g/dL (ref 30.0–36.0)
MCV: 98.2 fL (ref 80.0–100.0)
Monocytes Absolute: 0.2 10*3/uL (ref 0.1–1.0)
Monocytes Relative: 2 %
Neutro Abs: 9.8 10*3/uL — ABNORMAL HIGH (ref 1.7–7.7)
Neutrophils Relative %: 90 %
Platelets: 367 10*3/uL (ref 150–400)
RBC: 3.36 MIL/uL — ABNORMAL LOW (ref 3.87–5.11)
RDW: 12.5 % (ref 11.5–15.5)
Smear Review: NORMAL
WBC: 11 10*3/uL — ABNORMAL HIGH (ref 4.0–10.5)
nRBC: 0 % (ref 0.0–0.2)

## 2019-09-06 LAB — PROCALCITONIN: Procalcitonin: 0.95 ng/mL

## 2019-09-06 LAB — RESPIRATORY PANEL BY PCR

## 2019-09-06 LAB — LACTATE DEHYDROGENASE: LDH: 302 U/L — ABNORMAL HIGH (ref 98–192)

## 2019-09-06 LAB — COMPREHENSIVE METABOLIC PANEL
ALT: 18 U/L (ref 0–44)
AST: 24 U/L (ref 15–41)
Albumin: 3.2 g/dL — ABNORMAL LOW (ref 3.5–5.0)
Alkaline Phosphatase: 139 U/L — ABNORMAL HIGH (ref 38–126)
Anion gap: 19 — ABNORMAL HIGH (ref 5–15)
BUN: 121 mg/dL — ABNORMAL HIGH (ref 8–23)
CO2: 17 mmol/L — ABNORMAL LOW (ref 22–32)
Calcium: 9.6 mg/dL (ref 8.9–10.3)
Chloride: 111 mmol/L (ref 98–111)
Creatinine, Ser: 4.46 mg/dL — ABNORMAL HIGH (ref 0.44–1.00)
GFR calc Af Amer: 9 mL/min — ABNORMAL LOW (ref >60)
GFR calc non Af Amer: 8 mL/min — ABNORMAL LOW (ref >60)
Glucose, Bld: 287 mg/dL — ABNORMAL HIGH (ref 70–99)
Potassium: 4.7 mmol/L (ref 3.5–5.1)
Sodium: 147 mmol/L — ABNORMAL HIGH (ref 135–145)
Total Bilirubin: 1.2 mg/dL (ref 0.3–1.2)
Total Protein: 8.5 g/dL — ABNORMAL HIGH (ref 6.5–8.1)

## 2019-09-06 LAB — TROPONIN I (HIGH SENSITIVITY): Troponin I (High Sensitivity): 92 ng/L — ABNORMAL HIGH (ref <18)

## 2019-09-06 LAB — C-REACTIVE PROTEIN: CRP: 10.5 mg/dL — ABNORMAL HIGH (ref <1.0)

## 2019-09-06 LAB — FERRITIN: Ferritin: 856 ng/mL — ABNORMAL HIGH (ref 11–307)

## 2019-09-06 LAB — PHOSPHORUS: Phosphorus: 5.2 mg/dL — ABNORMAL HIGH (ref 2.5–4.6)

## 2019-09-06 LAB — GLUCOSE, CAPILLARY: Glucose-Capillary: 333 mg/dL — ABNORMAL HIGH (ref 70–99)

## 2019-09-06 LAB — MAGNESIUM: Magnesium: 2.6 mg/dL — ABNORMAL HIGH (ref 1.7–2.4)

## 2019-09-06 MED ORDER — ONDANSETRON HCL 4 MG PO TABS: 4.0000 mg | ORAL_TABLET | Freq: Four times a day (QID) | ORAL | Status: DC | PRN

## 2019-09-06 MED ORDER — LEVOTHYROXINE SODIUM 50 MCG PO TABS: 100.0000 ug | ORAL_TABLET | Freq: Every day | ORAL | Status: DC

## 2019-09-06 MED ORDER — GUAIFENESIN-DM 100-10 MG/5ML PO SYRP
5.0000 mL | ORAL_SOLUTION | Freq: Four times a day (QID) | ORAL | Status: DC | PRN
  Administered 2019-09-06 – 2019-09-07 (×2): 5 mL via ORAL
  Filled 2019-09-06 (×3): qty 5

## 2019-09-06 MED ORDER — ALLOPURINOL 100 MG PO TABS
50.0000 mg | ORAL_TABLET | ORAL | Status: DC
  Administered 2019-09-07: 50 mg via ORAL
  Filled 2019-09-06: qty 0.5

## 2019-09-06 MED ORDER — GABAPENTIN 300 MG PO CAPS: 300.0000 mg | ORAL_CAPSULE | Freq: Three times a day (TID) | ORAL | Status: DC

## 2019-09-06 MED ORDER — SODIUM CHLORIDE 0.9 % IV SOLN
1.0000 g | INTRAVENOUS | Status: DC
  Administered 2019-09-06: 1 g via INTRAVENOUS
  Filled 2019-09-06: qty 10

## 2019-09-06 MED ORDER — INSULIN ASPART 100 UNIT/ML ~~LOC~~ SOLN
0.0000 [IU] | Freq: Every day | SUBCUTANEOUS | Status: DC
  Administered 2019-09-07: 4 [IU] via SUBCUTANEOUS
  Filled 2019-09-06: qty 1

## 2019-09-06 MED ORDER — GABAPENTIN 300 MG PO CAPS
300.0000 mg | ORAL_CAPSULE | Freq: Two times a day (BID) | ORAL | Status: DC
  Administered 2019-09-07 (×2): 300 mg via ORAL
  Filled 2019-09-06 (×2): qty 1

## 2019-09-06 MED ORDER — LORATADINE 10 MG PO TABS
10.0000 mg | ORAL_TABLET | Freq: Every day | ORAL | Status: DC
  Administered 2019-09-06 – 2019-09-07 (×2): 10 mg via ORAL
  Filled 2019-09-06 (×2): qty 1

## 2019-09-06 MED ORDER — ADULT MULTIVITAMIN W/MINERALS CH
1.0000 | ORAL_TABLET | Freq: Every day | ORAL | Status: DC
  Administered 2019-09-06: 1 via ORAL
  Filled 2019-09-06: qty 1

## 2019-09-06 MED ORDER — ALLOPURINOL 100 MG PO TABS
100.0000 mg | ORAL_TABLET | Freq: Every day | ORAL | Status: DC
  Filled 2019-09-06: qty 1

## 2019-09-06 MED ORDER — SENNOSIDES-DOCUSATE SODIUM 8.6-50 MG PO TABS
1.0000 | ORAL_TABLET | Freq: Two times a day (BID) | ORAL | Status: DC
  Administered 2019-09-06 – 2019-09-07 (×3): 1 via ORAL
  Filled 2019-09-06 (×3): qty 1

## 2019-09-06 MED ORDER — PRO-STAT SUGAR FREE PO LIQD
30.0000 mL | Freq: Two times a day (BID) | ORAL | Status: DC
  Administered 2019-09-07: 30 mL via ORAL

## 2019-09-06 MED ORDER — AMLODIPINE BESYLATE 5 MG PO TABS
5.0000 mg | ORAL_TABLET | Freq: Every day | ORAL | Status: DC
  Administered 2019-09-06 – 2019-09-07 (×2): 5 mg via ORAL
  Filled 2019-09-06 (×2): qty 1

## 2019-09-06 MED ORDER — INSULIN ASPART 100 UNIT/ML ~~LOC~~ SOLN
0.0000 [IU] | Freq: Three times a day (TID) | SUBCUTANEOUS | Status: DC
  Administered 2019-09-06: 7 [IU] via SUBCUTANEOUS
  Administered 2019-09-07: 2 [IU] via SUBCUTANEOUS
  Administered 2019-09-07: 5 [IU] via SUBCUTANEOUS
  Filled 2019-09-06 (×3): qty 1

## 2019-09-06 MED ORDER — HEPARIN SODIUM (PORCINE) 5000 UNIT/ML IJ SOLN
5000.0000 [IU] | Freq: Three times a day (TID) | INTRAMUSCULAR | Status: DC
  Administered 2019-09-07 (×2): 5000 [IU] via SUBCUTANEOUS
  Filled 2019-09-06 (×2): qty 1

## 2019-09-06 MED ORDER — ATORVASTATIN CALCIUM 20 MG PO TABS
40.0000 mg | ORAL_TABLET | Freq: Every day | ORAL | Status: DC
  Administered 2019-09-06 – 2019-09-07 (×2): 40 mg via ORAL
  Filled 2019-09-06 (×2): qty 2

## 2019-09-06 MED ORDER — METFORMIN HCL ER 500 MG PO TB24: 500.0000 mg | ORAL_TABLET | Freq: Every day | ORAL | Status: DC

## 2019-09-06 MED ORDER — LOSARTAN POTASSIUM 50 MG PO TABS: 100.0000 mg | ORAL_TABLET | Freq: Every day | ORAL | Status: DC

## 2019-09-06 MED ORDER — ISOSORBIDE MONONITRATE ER 60 MG PO TB24
30.0000 mg | ORAL_TABLET | Freq: Every day | ORAL | Status: DC
  Administered 2019-09-06 – 2019-09-07 (×2): 30 mg via ORAL
  Filled 2019-09-06 (×2): qty 1

## 2019-09-06 MED ORDER — POLYETHYLENE GLYCOL 3350 17 G PO PACK: 17.0000 g | PACK | Freq: Every day | ORAL | Status: DC

## 2019-09-06 MED ORDER — SODIUM CHLORIDE 0.45 % IV SOLN
INTRAVENOUS | Status: DC
  Administered 2019-09-06: 09:00:00 via INTRAVENOUS

## 2019-09-06 MED ORDER — METOPROLOL TARTRATE 50 MG PO TABS
50.0000 mg | ORAL_TABLET | Freq: Two times a day (BID) | ORAL | Status: DC
  Administered 2019-09-06 – 2019-09-07 (×3): 50 mg via ORAL
  Filled 2019-09-06 (×3): qty 1

## 2019-09-06 MED ORDER — VITAMIN B-12 1000 MCG PO TABS
1000.0000 ug | ORAL_TABLET | Freq: Every day | ORAL | Status: DC
  Administered 2019-09-06 – 2019-09-07 (×2): 1000 ug via ORAL
  Filled 2019-09-06 (×3): qty 1

## 2019-09-06 NOTE — ED Notes (Signed)
Notified lab that there were 3 unsuccessful attempt to get 0500 labs, lab stated that they would send someone to get labs when they had someone available which would be around 0600.

## 2019-09-06 NOTE — ED Notes (Addendum)
Patient's brief and linens were changed. Yellow socks were placed on patient. Patient was cleaned with wipe. Patient was able to assist somewhat in repositioning, but needs help to turn and prefers to be turned slowly. Patient was cooperative with procedure. Patient asked for and was given ice water to drink. Patient has a congested cough and a moaning/grunting sound. Patient was able to answer simple questions appropriately. IV site WNL, infusing NS.

## 2019-09-06 NOTE — ED Notes (Signed)
Pt offered water to drink. Didn't seem to comprehend how to use a straw, kept opening mouth but not sucking on straw.

## 2019-09-06 NOTE — ED Notes (Signed)
This RN asked charge RN to place an order for hospital bed instead of ED stretcher. Awaiting arrival of bed.

## 2019-09-06 NOTE — ED Notes (Addendum)
Pt ate a full cup of applesauce with all her meds. Pt shook head no when asked if she wanted to eat a second cup of applesauce.

## 2019-09-06 NOTE — Progress Notes (Signed)
[facility]   PATIENT NAME: [name] [name]    MR#:  [id number]  DATE OF BIRTH:  11/01/1925  SUBJECTIVE:  patient was brought in from nursing home with altered mental status and oxygen saturation 88% on room air. She was recently tested positive for COVID 12 days ago. During my evaluation patient saturations are 97% on room air. She was not in any respiratory distress.  Patient is lethargic unable to give any review of systems or history. Records reviewed  REVIEW OF SYSTEMS:   Review of Systems  Unable to perform ROS: Patient nonverbal     DRUG ALLERGIES:   Allergies  Allergen Reactions  . Acetaminophen     Upset stomach  . Codeine     VITALS:  Blood pressure 127/71, pulse 94, temperature 98.4 F (36.9 C), temperature source Axillary, resp. rate 16, height 5\' 4"  (1.626 m), weight 75 kg, SpO2 98 %.  PHYSICAL EXAMINATION:   Physical Exam limited  GENERAL:  85 y.o.-year-old patient lying in the bed with no acute distress.  EYES: Pupils equal, round, reactive to light and accommodation. No scleral icterus. Extraocular muscles intact.  HEENT: Head atraumatic, normocephalic. Oropharynx and nasopharynx clear.   LUNGS: Normal breath sounds bilaterally, no wheezing, rales, rhonchi. No use of accessory muscles of respiration.  CARDIOVASCULAR: S1, S2 normal. No murmurs, rubs, or gallops.  ABDOMEN: Soft, nontender, nondistended. Bowel sounds present. No organomegaly or mass.  EXTREMITIES: No cyanosis, clubbing or edema b/l.    NEUROLOGIC: unable to assess. Patient PSYCHIATRIC:  patient is lethargic SKIN: No obvious rash, lesion, or ulcer.   LABORATORY PANEL:  CBC Recent Labs  Lab 09/06/19 0654  WBC 11.0*  HGB 11.2*  HCT 33.0*  PLT 367    Chemistries  Recent Labs  Lab 09/06/19 0654  NA 147*  K 4.7  CL 111  CO2 17*  GLUCOSE 287*  BUN 121*  CREATININE 4.46*  CALCIUM 9.6  MG 2.6*  AST 24  ALT 18  ALKPHOS 139*   BILITOT 1.2   Cardiac Enzymes No results for input(s): TROPONINI in the last 168 hours. RADIOLOGY:  Dg Chest Portable 1 View  Result Date: 09/05/2019 CLINICAL DATA:  COVID-19 positive. Encephalopathy. EXAM: PORTABLE CHEST 1 VIEW COMPARISON:  08/25/2019 FINDINGS: Cardiomegaly and findings of prior median sternotomy and CABG. No pulmonary edema or focal airspace consolidation. No pleural effusion or pneumothorax. IMPRESSION: 1. No acute cardiopulmonary disease. 2. Cardiomegaly without pulmonary edema. Electronically Signed   By: 08/27/2019 M.D.   On: 09/05/2019 19:03   ASSESSMENT AND PLAN:  85 year old female with pertinent past medical history of CAD s/p CABG, diabetes mellitus, DVT, hyperlipidemia, hypertension, hypothyroidism, liver cyst, PVD, chronic diastolic CHF, CKD, and central retinal artery occlusion presenting to the ED with altered mental status and hypoxia.  1. Acute respiratory failure with hypoxia  -secondary to pneumonia due to COVID-19 illness -Patient reports fevers, cough, and SOB since 10/15, was positive for COVID-19 the following day, has become progressively dyspneic, and is found hypoxic requiring supplemental O2 requirement-- currently person saturations are 97% on room air - CXR-findings consistent with  PNAmild left lower lobe -Blood cultures   negative - procalcitonin-- 0.95 -  continue Decadron - Palliative consult-- appreciated. Patient is DNR. Daughter appreciates palliative care consult. She wants hospice to follow as outpatient at her facility -will start her on IV antibiotic  2. Altered mental status -likely multifactorial in the setting of acute renal failure with uremia, respiratory  failure with hypoxia secondary to possible pneumonia from Covid illness  3. Acute renal failure with uremia superimposed on CKD - BUN/creatinine elevated above baseline - Hold nephrotoxins-- and suggested according to the renal function - Monitor renal  function  4. Chronic diastolic CHF - Last Echo - Continue Imdur, metoprolol - Holding losartan in the setting of AKI  5. CAD - MI in the past + CABG  - HTN, HLD, DM control as below  6. HLD + Goal LDL<100 - Atorvastatin 40mg  PO qhs  7. HTN  + Goal BP <130/80 - Continue amlodipine - Continue metoprolol - Hold losartan in the setting of AKI  8. Diabetes mellitus type 2 - Hold Metformin  - Continue Sitagliptin - SSI   Per Palliative care's Discussion with patient's daughter [name] [name] regarding patient's current medical condition with poor prognosis.  At this time she would like to proceed with continuing current treatment until tomorrow morning.   She however does not want to escalate care in the event that patient's condition worsens/deteriorates overnight. SHe is agreeable with Hospice to follow   CODE STATUS: DNR    TOTAL TIME TAKING CARE OF THIS PATIENT: *30 minutes.  >50% time spent on counselling and coordination of care  POSSIBLE D/C IN *2-3 DAYS, DEPENDING ON CLINICAL CONDITION.  Note: This dictation was prepared with Dragon dictation along with smaller phrase technology. Any transcriptional errors that result from this process are unintentional.  Fritzi Mandes M.D on 09/06/2019 at 2:59 PM  Between 7am to 6pm - Pager - [phone]  After 6pm go to [email or web address] - password [email or web address] [facility]  Office  [phone]  CC: Primary care physician; Juluis Pitch, MDPatient ID: [name] [name], female   DOB: 1924/11/18, 85 y.o.   MRN: [id number]

## 2019-09-06 NOTE — Progress Notes (Signed)
ID As per  Dr. Posey Pronto  ID consult is needed now. Please Call if needed

## 2019-09-06 NOTE — ED Notes (Signed)
Lab arrived to draw morning labs per admission orders.

## 2019-09-06 NOTE — ED Notes (Signed)
Lab at bedside

## 2019-09-06 NOTE — Consult Note (Addendum)
Consultation Note Date: 09/06/2019   Patient Name: [name] [name]  DOB: 1925-02-01  MRN: [id number]  Age / Sex: 85 y.o., female  PCP: Juluis Pitch, MD Referring Physician: Fritzi Mandes, MD  Reason for Consultation: Establishing goals of care  HPI/Patient Profile: [name] [name] is a 85 y.o. female recent COVID-22 diagnosis presents to the ER for altered mental status confusion.  Unable to provide any additional history.  Patient was found to have low O2 sats around 88% to 89% placed on 2 L nasal cannula.  Patient states that she is hurting all over and just moaning.  Does arrive with a MOST form showing that she is a DNR/DNI.  Clinical Assessment and Goals of Care: Patient admitted for acute respiratory failure with hypoxia and altered mental status. She was noted to be  Covid positive 10/15 during previous admission. Spoke with HPOA [name] [name]. There are 5 living children including her. She states she is one of the 2 health care POAs. She states Ms. Rochette was independent in 2001 when [name] moved to [facility]. Around 2015 she went to [place] following a leg infection. Ms. Sakamoto had marked decline from that point and never left [place].   [name] tells me Ms. Goldsmith "has been through the ringer. Her body can't take it anymore." She states she feels the patient needs to hear that it is okay for her to stop life prolonging interventions, and go if she is ready, but they have not been able to tell her that due to visitation restrictions. Ms. Limes is the [name] [name] of her family, and is the person who helps everyone else. [name] feels the patient may be holding on because she feels they need her to stay.   We discussed her diagnosis, prognosis, GOC, EOL wishes disposition and options. [name] states she is the HPOA, because she knows what Ms. Talwar would want. Ms. Louth is Jehovah's  Witness. She would not want blood or blood products, ventilator support, or CPR. She states several times Ms. Wamble has declined such that the entire family was called to bedside for immanent death, but she then she improved.     A detailed discussion was had today regarding advanced directives.  Concepts specific to code status, artifical feeding and hydration, IV antibiotics and rehospitalization were discussed.  The difference between an aggressive medical intervention path and a comfort care path was discussed.  Values and goals of care important to patient and family were attempted to be elicited.  Discussed limitations of medical interventions to prolong quality of life in some situations and discussed the concept of human mortality.  [name] states it makes sense for hospice care at this time as she sees her decline, but needs to speak with other family members before making a final decision.     ADDENDUM: Spoke with [name] [name] and [name] [name], 2 of the patient's children. They state Ms. Wise is their grandmother, but when their mother (patient's daughter) died, she legally adopted them. She states  she spoke with her uncle, the other HPOA, and they would like hospice to follow at her facility on discharge. They would like her medically optimized, and treat the treatable prior to D/C.    I completed a MOST form today and the signed original was placed in the chart. A photocopy was placed in the chart to be scanned into EMR. The patient's daughters outlined their wishes for the following treatment decisions:  Cardiopulmonary Resuscitation: Do Not Attempt Resuscitation (DNR/No CPR)  Medical Interventions: Comfort Measures: Keep clean, warm, and dry. Use medication by any route, positioning, wound care, and other measures to relieve pain and suffering. Use oxygen, suction and manual treatment of airway obstruction as needed for comfort. Do not transfer to the hospital unless  comfort needs cannot be met in current location.  Antibiotics: Determine use of limitation of antibiotics when infection occurs  IV Fluids: No IV fluids (provide other measures to ensure comfort)  Feeding Tube: No feeding tube        SUMMARY OF RECOMMENDATIONS   Family would like hospice at her facility. They would like her medically optimized prior to D/C.    Prognosis:   < 6 months COVID positive. PNA. Dehydration. Recent UTI. CKD. CHF. DM.       Primary Diagnoses: Present on Admission: . COVID-19 virus infection   I have reviewed the medical record, interviewed the patient and family, and examined the patient. The following aspects are pertinent.  Past Medical History:  Diagnosis Date  . Blood clot of neck vein   . Chronic diastolic CHF (congestive heart failure) ([place])    a. December 01, 2011 shows normal systolic function, ejection fraction greater than 55%, mild LVH, mild TR, moderate right ventricular systolic pressures.  . Coronary artery disease    a. s/p CABG in [place] - late 90's/early 2000's;  b. s/p stenting  . Diabetes mellitus   . DVT (deep venous thrombosis) ([place])    a. RLE - chronic xarelto.  . Hyperlipidemia   . Hypertension   . Hypothyroidism   . Liver cyst   . PVD (peripheral vascular disease) ([place])    Social History   Socioeconomic History  . Marital status: Widowed    Spouse name: Not on file  . Number of children: Not on file  . Years of education: Not on file  . Highest education level: Not on file  Occupational History  . Not on file  Social Needs  . Financial resource strain: Not on file  . Food insecurity    Worry: Not on file    Inability: Not on file  . Transportation needs    Medical: Not on file    Non-medical: Not on file  Tobacco Use  . Smoking status: Former Smoker    Packs/day: 1.00    Types: Cigarettes    Quit date: 03/11/1998    Years since quitting: 21.5  . Smokeless tobacco: Never Used  Substance and Sexual Activity   . Alcohol use: No  . Drug use: No  . Sexual activity: Not on file  Lifestyle  . Physical activity    Days per week: Not on file    Minutes per session: Not on file  . Stress: Not on file  Relationships  . Social Herbalist on phone: Not on file    Gets together: Not on file    Attends religious service: Not on file    Active member of club or organization: Not on file  Attends meetings of clubs or organizations: Not on file    Relationship status: Not on file  Other Topics Concern  . Not on file  Social History Narrative  . Not on file   Family History  Family history unknown: Yes   Scheduled Meds: . enoxaparin (LOVENOX) injection  30 mg Subcutaneous Q24H  . methylPREDNISolone (SOLU-MEDROL) injection  40 mg Intravenous Q12H   Continuous Infusions: . sodium chloride 50 mL/hr at 09/06/19 0914   PRN Meds:.fentaNYL (SUBLIMAZE) injection Medications Prior to Admission:  Prior to Admission medications   Medication Sig Start Date End Date Taking? Authorizing Provider  allopurinol (ZYLOPRIM) 100 MG tablet Take 100 mg by mouth daily.    Yes [provider]  Amino Acids-Protein Hydrolys (FEEDING SUPPLEMENT, PRO-STAT SUGAR FREE 64,) LIQD Take 30 mLs by mouth 2 (two) times daily.   Yes [provider]  amLODipine (NORVASC) 5 MG tablet Take 5 mg by mouth daily.   Yes [provider]  atorvastatin (LIPITOR) 40 MG tablet Take 1 tablet (40 mg total) by mouth daily. 10/15/15  Yes Minna Merritts, MD  cetirizine (ZYRTEC) 10 MG tablet Take 10 mg by mouth daily.   Yes [provider]  gabapentin (NEURONTIN) 300 MG capsule Take 300 mg by mouth 3 (three) times daily.   Yes [provider]  iron polysaccharides (NIFEREX) 150 MG capsule Take 150 mg by mouth 2 (two) times daily.    Yes [provider]  isosorbide mononitrate (IMDUR) 30 MG 24 hr tablet TAKE ONE TABLET BY MOUTH EVERY DAY Patient taking differently: Take 30 mg by  mouth daily.  01/02/14  Yes Gollan, Kathlene November, MD  levothyroxine (SYNTHROID, LEVOTHROID) 100 MCG tablet Take 100 mcg by mouth daily before breakfast.   Yes [provider]  losartan (COZAAR) 100 MG tablet Take 100 mg by mouth daily.   Yes [provider]  metFORMIN (GLUCOPHAGE-XR) 500 MG 24 hr tablet Take 500 mg by mouth daily with breakfast.    Yes [provider]  metoprolol (LOPRESSOR) 50 MG tablet Take 50 mg by mouth 2 (two) times daily.   Yes [provider]  Multiple Vitamins-Minerals (MULTIVITAMIN WITH MINERALS) tablet Take 1 tablet by mouth daily.   Yes [provider]  polyethylene glycol (MIRALAX / GLYCOLAX) packet Take 17 g by mouth daily at 6 PM.    Yes [provider]  sennosides-docusate sodium (SENOKOT-S) 8.6-50 MG tablet Take 1 tablet by mouth 2 (two) times daily.   Yes [provider]  sitaGLIPtin (JANUVIA) 100 MG tablet Take 100 mg by mouth daily.   Yes [provider]  vitamin B-12 (CYANOCOBALAMIN) 1000 MCG tablet Take 1,000 mcg by mouth daily.   Yes [provider]  ondansetron (ZOFRAN) 4 MG tablet Take 4 mg by mouth every 8 (eight) hours as needed for nausea/vomiting. 08/18/19   [provider]   Allergies  Allergen Reactions  . Acetaminophen     Upset stomach  . Codeine       Vital Signs: BP 138/76   Pulse 96   Temp 98.4 F (36.9 C) (Axillary)   Resp 18   Ht _0  (1.626 m)   Wt 75 kg   SpO2 97%   BMI 28.38 kg/m  Pain Scale: 0-10   Pain Score: Asleep   SpO2: SpO2: 97 % O2 Device:SpO2: 97 % O2 Flow Rate: .   IO: Intake/output summary: No intake or output data in the 24 hours ending 09/06/19 1021  LBM:   Baseline Weight: Weight: 75 kg Most recent weight: Weight: 75 kg     Palliative Assessment/Data:    Spoke with house A/C, primary MD, ED RN.  The above conversation was completed via telephone due to the visitor restrictions during the COVID-19 pandemic.  Thorough chart review and discussion with necessary members of the care team was completed as part of assessment. All issues were discussed and addressed but no physical exam was performed.  Time In: 9:00, 11:30 Time Out: 10:50, 12:40 Time Total: 1 hour 50 min, 70 min Greater than 50%  of this time was spent counseling and coordinating care related to the above assessment and plan.  Signed by: Asencion Gowda, NP   Please contact Palliative Medicine Team phone at [phone] for questions and concerns.  For individual provider: See Shea Evans

## 2019-09-06 NOTE — ED Notes (Signed)
Pt meds crushed and placed in apple sauce. Pt ate applesauce well.   Pt moved to hospital bed.  Pt urinated into brief and cleaned. Chuck placed under pt.

## 2019-09-06 NOTE — ED Notes (Signed)
Report given to Andrea RN

## 2019-09-06 NOTE — ED Notes (Addendum)
This RN took phone to pt, pt was able to listen/ communicate with daughter on phone. Pt was repositioned after phone call. Pt dry and comfortable at this time.

## 2019-09-06 NOTE — ED Notes (Signed)
Dr. Patel at bedside 

## 2019-09-06 NOTE — ED Notes (Signed)
Hospital bed at bedside. Awaiting a sheet for bed.

## 2019-09-07 LAB — COMPREHENSIVE METABOLIC PANEL
ALT: 14 U/L (ref 0–44)
AST: 17 U/L (ref 15–41)
Albumin: 3.1 g/dL — ABNORMAL LOW (ref 3.5–5.0)
Alkaline Phosphatase: 122 U/L (ref 38–126)
Anion gap: 16 — ABNORMAL HIGH (ref 5–15)
BUN: 132 mg/dL — ABNORMAL HIGH (ref 8–23)
CO2: 16 mmol/L — ABNORMAL LOW (ref 22–32)
Calcium: 9.6 mg/dL (ref 8.9–10.3)
Chloride: 114 mmol/L — ABNORMAL HIGH (ref 98–111)
Creatinine, Ser: 4.24 mg/dL — ABNORMAL HIGH (ref 0.44–1.00)
GFR calc Af Amer: 10 mL/min — ABNORMAL LOW (ref >60)
GFR calc non Af Amer: 9 mL/min — ABNORMAL LOW (ref >60)
Glucose, Bld: 302 mg/dL — ABNORMAL HIGH (ref 70–99)
Potassium: 4.7 mmol/L (ref 3.5–5.1)
Sodium: 146 mmol/L — ABNORMAL HIGH (ref 135–145)
Total Bilirubin: 0.6 mg/dL (ref 0.3–1.2)
Total Protein: 7.9 g/dL (ref 6.5–8.1)

## 2019-09-07 LAB — CBC WITH DIFFERENTIAL/PLATELET
Abs Immature Granulocytes: 0.18 10*3/uL — ABNORMAL HIGH (ref 0.00–0.07)
Basophils Absolute: 0 10*3/uL (ref 0.0–0.1)
Basophils Relative: 0 %
Eosinophils Absolute: 0 10*3/uL (ref 0.0–0.5)
Eosinophils Relative: 0 %
HCT: 31.3 % — ABNORMAL LOW (ref 36.0–46.0)
Hemoglobin: 10.4 g/dL — ABNORMAL LOW (ref 12.0–15.0)
Immature Granulocytes: 1 %
Lymphocytes Relative: 8 %
Lymphs Abs: 1.2 10*3/uL (ref 0.7–4.0)
MCH: 31.8 pg (ref 26.0–34.0)
MCHC: 33.2 g/dL (ref 30.0–36.0)
MCV: 95.7 fL (ref 80.0–100.0)
Monocytes Absolute: 0.8 10*3/uL (ref 0.1–1.0)
Monocytes Relative: 6 %
Neutro Abs: 12.4 10*3/uL — ABNORMAL HIGH (ref 1.7–7.7)
Neutrophils Relative %: 85 %
Platelets: 437 10*3/uL — ABNORMAL HIGH (ref 150–400)
RBC: 3.27 MIL/uL — ABNORMAL LOW (ref 3.87–5.11)
RDW: 12.4 % (ref 11.5–15.5)
WBC: 14.6 10*3/uL — ABNORMAL HIGH (ref 4.0–10.5)
nRBC: 0 % (ref 0.0–0.2)

## 2019-09-07 LAB — GLUCOSE, CAPILLARY
Glucose-Capillary: 153 mg/dL — ABNORMAL HIGH (ref 70–99)
Glucose-Capillary: 296 mg/dL — ABNORMAL HIGH (ref 70–99)
Glucose-Capillary: 328 mg/dL — ABNORMAL HIGH (ref 70–99)

## 2019-09-07 LAB — MAGNESIUM: Magnesium: 2.7 mg/dL — ABNORMAL HIGH (ref 1.7–2.4)

## 2019-09-07 LAB — PHOSPHORUS: Phosphorus: 4.9 mg/dL — ABNORMAL HIGH (ref 2.5–4.6)

## 2019-09-07 MED ORDER — LORAZEPAM 2 MG/ML PO CONC: 0.5000 mg | Freq: Four times a day (QID) | ORAL | 0 refills | Status: AC | PRN

## 2019-09-07 MED ORDER — MORPHINE SULFATE (CONCENTRATE) 10 MG/0.5ML PO SOLN: 5.0000 mg | ORAL | Status: DC | PRN

## 2019-09-07 MED ORDER — LORAZEPAM 2 MG/ML PO CONC: 0.5000 mg | Freq: Four times a day (QID) | ORAL | Status: DC | PRN

## 2019-09-07 MED ORDER — MORPHINE SULFATE (CONCENTRATE) 10 MG/0.5ML PO SOLN: 5.0000 mg | ORAL | 0 refills | Status: AC | PRN

## 2019-09-07 NOTE — ED Notes (Addendum)
Pt's bedsheets, pad, and brief changed. While changing, this nurse noted a sacral wound. Details under wound assessment documentation. Dr. Posey Pronto notified. Pt turned on to side and will continue to be turned q2 hours.

## 2019-09-07 NOTE — ED Notes (Signed)
Patient is resting comfortably. Pt sats are 91% on room air. Pt placed on 2L. Sats now 100%. Pt is clean and dry at this time. New bag of fluid hung.

## 2019-09-07 NOTE — ED Notes (Signed)
Pt BP trending down. Will continue to monitor.

## 2019-09-07 NOTE — NC FL2 (Signed)
[place] LEVEL OF CARE SCREENING TOOL     IDENTIFICATION  Patient Name: [name] [name] Birthdate: 13-Apr-1925 Sex: female Admission Date (Current Location): 09/05/2019  [place] and [place] Number:  [name] [name] [id number] [place] and Address:  [facility], [address] [place], [place] [address]      Provider Number: [id number]  Attending Physician Name and Address:  Fritzi Mandes, MD  Relative Name and Phone Number:  Dolly Rias Daughter [phone]    Current Level of Care: Hospital Recommended Level of Care: SNF  Prior Approval Number:    Date Approved/Denied:   PASRR Number:    Discharge Plan: SNF     Current Diagnoses: Patient Active Problem List   Diagnosis Date Noted  . COVID-19 virus infection 09/05/2019  . HCAP (healthcare-associated pneumonia) 09/10/2018  . Acute renal failure superimposed on chronic kidney disease ([place]) 09/10/2018  . Hypertensive heart disease 05/15/2014  . Candidal dermatitis 05/15/2014  . Generalized weakness 05/15/2014  . Pressure ulcer, stage 2 ([place]) 05/15/2014  . Neuropathic pain 05/15/2014  . Absence of bladder continence 05/15/2014  . Diabetes mellitus type 2 with complications ([place]) 10/93/2355  . Hypothyroidism 05/14/2014  . Gout, chronic 05/14/2014  . Constipation 05/14/2014  . Coronary artery disease   . Chronic diastolic CHF (congestive heart failure) ([place]) 03/04/2013  . Essential hypertension 12/13/2012  . Tachycardia 12/11/2011  . SOB (shortness of breath) 03/12/2011  . S/P CABG (coronary artery bypass graft) 03/12/2011  . PVD (peripheral vascular disease) ([place]) 03/12/2011  . Hyperlipemia 03/12/2011  . Edema 03/12/2011    Orientation RESPIRATION BLADDER Height & Weight     Self  O2 Incontinent Weight: 165 lb 5.5 oz (75 kg) Height:  5\' 4"  (162.6 cm)  BEHAVIORAL SYMPTOMS/MOOD NEUROLOGICAL BOWEL NUTRITION STATUS      Incontinent Diet  AMBULATORY STATUS COMMUNICATION  OF NEEDS Skin   Limited Assist Verbally Normal                       Personal Care Assistance Level of Assistance  Bathing, Feeding, Dressing Bathing Assistance: Maximum assistance Feeding assistance: Maximum assistance Dressing Assistance: Maximum assistance     Functional Limitations Info  Sight, Hearing, Speech Sight Info: Adequate Hearing Info: Adequate Speech Info: Adequate    SPECIAL CARE FACTORS FREQUENCY                       Contractures Contractures Info: Not present    Additional Factors Info  Code Status, Allergies Code Status Info: DNR Allergies Info: Acetaminophen, Codeine           Current Medications (09/07/2019):  This is the current hospital active medication list Current Facility-Administered Medications  Medication Dose Route Frequency Provider Last Rate Last Dose  . 0.45 % sodium chloride infusion   Intravenous Continuous Fritzi Mandes, MD 75 mL/hr at 09/07/19 0404    . allopurinol (ZYLOPRIM) tablet 50 mg  50 mg Oral Cherylynn Ridges, MD   50 mg at 09/07/19 1022  . amLODipine (NORVASC) tablet 5 mg  5 mg Oral Daily Fritzi Mandes, MD   5 mg at 09/07/19 1014  . atorvastatin (LIPITOR) tablet 40 mg  40 mg Oral Daily Fritzi Mandes, MD   40 mg at 09/07/19 1007  . cefTRIAXone (ROCEPHIN) 1 g in sodium chloride 0.9 % 100 mL IVPB  1 g Intravenous Q24H Fritzi Mandes, MD   Stopped at 09/06/19 1827  . feeding supplement (PRO-STAT SUGAR FREE 64) liquid  30 mL  30 mL Oral BID Enedina Finner, MD      . fentaNYL (SUBLIMAZE) injection 25 mcg  25 mcg Intravenous Q2H PRN Willy Eddy, MD   25 mcg at 09/06/19 1814  . gabapentin (NEURONTIN) capsule 300 mg  300 mg Oral BID Enedina Finner, MD   300 mg at 09/07/19 1007  . guaiFENesin-dextromethorphan (ROBITUSSIN DM) 100-10 MG/5ML syrup 5 mL  5 mL Oral Q6H PRN Enedina Finner, MD   5 mL at 09/07/19 0041  . heparin injection 5,000 Units  5,000 Units Subcutaneous Q8H Enedina Finner, MD   5,000 Units at 09/07/19 1017  . insulin aspart  (novoLOG) injection 0-5 Units  0-5 Units Subcutaneous QHS Enedina Finner, MD   4 Units at 09/07/19 0040  . insulin aspart (novoLOG) injection 0-9 Units  0-9 Units Subcutaneous TID WC Enedina Finner, MD   5 Units at 09/07/19 1035  . isosorbide mononitrate (IMDUR) 24 hr tablet 30 mg  30 mg Oral Daily Enedina Finner, MD   30 mg at 09/07/19 1008  . [START ON 09/08/2019] levothyroxine (SYNTHROID) tablet 100 mcg  100 mcg Oral QAC breakfast Enedina Finner, MD      . loratadine (CLARITIN) tablet 10 mg  10 mg Oral Daily Enedina Finner, MD   10 mg at 09/07/19 1007  . LORazepam (ATIVAN) 2 MG/ML concentrated solution 0.5 mg  0.5 mg Oral Q6H PRN Enedina Finner, MD      . methylPREDNISolone sodium succinate (SOLU-MEDROL) 40 mg/mL injection 40 mg  40 mg Intravenous Q12H Jimmye Norman, NP   40 mg at 09/07/19 1041  . metoprolol tartrate (LOPRESSOR) tablet 50 mg  50 mg Oral BID Enedina Finner, MD   50 mg at 09/07/19 1050  . morphine CONCENTRATE 10 MG/0.5ML oral solution 5 mg  5 mg Oral Q2H PRN Enedina Finner, MD      . multivitamin with minerals tablet 1 tablet  1 tablet Oral Daily Enedina Finner, MD   1 tablet at 09/06/19 1747  . ondansetron (ZOFRAN) tablet 4 mg  4 mg Oral QID PRN Enedina Finner, MD      . polyethylene glycol (MIRALAX / GLYCOLAX) packet 17 g  17 g Oral q1800 Enedina Finner, MD      . senna-docusate (Senokot-S) tablet 1 tablet  1 tablet Oral BID Enedina Finner, MD   1 tablet at 09/07/19 1050  . vitamin B-12 (CYANOCOBALAMIN) tablet 1,000 mcg  1,000 mcg Oral Daily Enedina Finner, MD   1,000 mcg at 09/07/19 1046   Current Outpatient Medications  Medication Sig Dispense Refill  . LORazepam (ATIVAN) 2 MG/ML concentrated solution Take 0.3 mLs (0.6 mg total) by mouth every 6 (six) hours as needed for anxiety. 30 mL 0  . Morphine Sulfate (MORPHINE CONCENTRATE) 10 MG/0.5ML SOLN concentrated solution Take 0.25 mLs (5 mg total) by mouth every 2 (two) hours as needed for severe pain or anxiety. 180 mL 0     Discharge  Medications: Please see discharge summary for a list of discharge medications.  Relevant Imaging Results:  Relevant Lab Results:   Additional Information SSN: [id number]  Tania Trang Bouse, LCSW

## 2019-09-07 NOTE — Discharge Summary (Signed)
[facility] NAME: [name] [name]    MR#:  [id number]  DATE OF BIRTH:  [birth year]  DATE OF ADMISSION:  09/05/2019 ADMITTING PHYSICIAN: No admitting provider for patient encounter.  DATE OF DISCHARGE: 09/07/2019  PRIMARY CARE PHYSICIAN: Juluis Pitch, MD    ADMISSION DIAGNOSIS:  COVID +; AMS  DISCHARGE DIAGNOSIS:  Acute hypoxic respiratory failure secondary to pneumonia due to COVID illness acute on chronic renal failure IV altered mental status with worsening encephalopathy secondary to uremia  SECONDARY DIAGNOSIS:   Past Medical History:  Diagnosis Date  . Blood clot of neck vein   . Chronic diastolic CHF (congestive heart failure) ([place])    a. December 01, 2011 shows normal systolic function, ejection fraction greater than 55%, mild LVH, mild TR, moderate right ventricular systolic pressures.  . Coronary artery disease    a. s/p CABG in [place] - late 90's/early 2000's;  b. s/p stenting  . Diabetes mellitus   . DVT (deep venous thrombosis) ([place])    a. RLE - chronic xarelto.  . Hyperlipidemia   . Hypertension   . Hypothyroidism   . Liver cyst   . PVD (peripheral vascular disease) [facility])     HOSPITAL COURSE:   85 year old female with pertinent past medical history of CADs/pCABG, diabetes mellitus, DVT, hyperlipidemia, hypertension, hypothyroidism, liver cyst, PVD, chronic diastolic CHF, CKD, and central retinal artery occlusion presenting to the ED with altered mental status and hypoxia.  1.Acute respiratory failure with hypoxia -secondary to pneumonia due to COVID-19illness -Patient reports fevers, cough, and SOB since 10/15, was positive for COVID-19 the following day, has become progressively dyspneic, and is foundhypoxic requiringsupplemental O2 requirement-- currently person saturations are 97% on room air -CXR-findings consistent with PNAmild left lower lobe -Blood cultures negative -  procalcitonin-- 0.95 -- Palliative consult-- appreciated. Patient is DNR. Daughter appreciates palliative care consult.   2.Altered mental status-likely multifactorial in the setting of acute renal failure with uremia, respiratory failurewithhypoxia secondary to possible pneumonia from Covid illness  3.Acute renal failure with uremia superimposed on CKD -BUN/creatinine elevated above baseline -Hold nephrotoxins-- and suggested according to the renal function -patient received IV fluids with no improvement in lab  4.Chronic diastolic CHF  I had a long discussion with patient's daughter [name] and who is in [place] and another daughter who was in [place] over the phone given poor prognosis and no improvement. Since mentation is about the same she remains most of the time lethargic with very minimal oral intake. Her kidney numbers including uremia worsened. The daughters are in agreement with hospice and comfort care. They do understand patient will not be on oral home meds since she is not able to take much orally. [facility] with [facility] care orders. Morphine and Ativan has been prescribed.  Both her daughters appreciate my phone call and taking care of patient and facilitating above. CONSULTS OBTAINED:  Treatment Team:  Tsosie Billing, MD Murlean Iba, MD  DRUG ALLERGIES:   Allergies  Allergen Reactions  . Acetaminophen     Upset stomach  . Codeine     DISCHARGE MEDICATIONS:   Allergies as of 09/07/2019      Reactions   Acetaminophen    Upset stomach   Codeine       Medication List    STOP taking these medications   allopurinol 100 MG tablet Commonly known as: ZYLOPRIM   amLODipine 5 MG tablet Commonly known as:  NORVASC   atorvastatin 40 MG tablet Commonly known as: LIPITOR   cetirizine 10 MG tablet Commonly known as: ZYRTEC   feeding supplement (PRO-STAT SUGAR FREE 64) Liqd   gabapentin 300 MG  capsule Commonly known as: NEURONTIN   guaiFENesin-dextromethorphan 100-10 MG/5ML syrup Commonly known as: ROBITUSSIN DM   HYDROcodone-acetaminophen 5-325 MG tablet Commonly known as: NORCO/VICODIN   iron polysaccharides 150 MG capsule Commonly known as: NIFEREX   isosorbide mononitrate 30 MG 24 hr tablet Commonly known as: IMDUR   levothyroxine 100 MCG tablet Commonly known as: SYNTHROID   losartan 100 MG tablet Commonly known as: COZAAR   metFORMIN 500 MG 24 hr tablet Commonly known as: GLUCOPHAGE-XR   metoprolol tartrate 50 MG tablet Commonly known as: LOPRESSOR   multivitamin with minerals tablet   ondansetron 4 MG tablet Commonly known as: ZOFRAN   polyethylene glycol 17 g packet Commonly known as: MIRALAX / GLYCOLAX   sennosides-docusate sodium 8.6-50 MG tablet Commonly known as: SENOKOT-S   sitaGLIPtin 100 MG tablet Commonly known as: JANUVIA   vitamin B-12 1000 MCG tablet Commonly known as: CYANOCOBALAMIN     TAKE these medications   LORazepam 2 MG/ML concentrated solution Commonly known as: ATIVAN Take 0.3 mLs (0.6 mg total) by mouth every 6 (six) hours as needed for anxiety.   morphine CONCENTRATE 10 MG/0.5ML Soln concentrated solution Take 0.25 mLs (5 mg total) by mouth every 2 (two) hours as needed for severe pain or anxiety.       If you experience worsening of your admission symptoms, develop shortness of breath, life threatening emergency, suicidal or homicidal thoughts you must seek medical attention immediately by calling 911 or calling your MD immediately  if symptoms less severe.  You Must read complete instructions/literature along with all the possible adverse reactions/side effects for all the Medicines you take and that have been prescribed to you. Take any new Medicines after you have completely understood and accept all the possible adverse reactions/side effects.   Please note  You were cared for by a hospitalist during your  hospital stay. If you have any questions about your discharge medications or the care you received while you were in the hospital after you are discharged, you can call the unit and asked to speak with the hospitalist on call if the hospitalist that took care of you is not available. Once you are discharged, your primary care physician will handle any further medical issues. Please note that NO REFILLS for any discharge medications will be authorized once you are discharged, as it is imperative that you return to your primary care physician (or establish a relationship with a primary care physician if you do not have one) for your aftercare needs so that they can reassess your need for medications and monitor your lab values.  DATA REVIEW:   CBC  Recent Labs  Lab 09/07/19 1036  WBC 14.6*  HGB 10.4*  HCT 31.3*  PLT 437*    Chemistries  Recent Labs  Lab 09/07/19 1036  NA 146*  K 4.7  CL 114*  CO2 16*  GLUCOSE 302*  BUN 132*  CREATININE 4.24*  CALCIUM 9.6  MG 2.7*  AST 17  ALT 14  ALKPHOS 122  BILITOT 0.6    Microbiology Results   Recent Results (from the past 240 hour(s))  Respiratory Panel by PCR     Status: None   Collection Time: 09/05/19  8:33 PM   Specimen: Nasopharyngeal Swab; Respiratory  Result Value Ref Range  Status   Adenovirus NOT DETECTED NOT DETECTED Final   Coronavirus 229E NOT DETECTED NOT DETECTED Final    Comment: (NOTE) The Coronavirus on the Respiratory Panel, DOES NOT test for the novel  Coronavirus (2019 nCoV)    Coronavirus HKU1 NOT DETECTED NOT DETECTED Final   Coronavirus NL63 NOT DETECTED NOT DETECTED Final   Coronavirus OC43 NOT DETECTED NOT DETECTED Final   Metapneumovirus NOT DETECTED NOT DETECTED Final   Rhinovirus / Enterovirus NOT DETECTED NOT DETECTED Final   Influenza A NOT DETECTED NOT DETECTED Final   Influenza B NOT DETECTED NOT DETECTED Final   Parainfluenza Virus 1 NOT DETECTED NOT DETECTED Final   Parainfluenza Virus 2 NOT  DETECTED NOT DETECTED Final   Parainfluenza Virus 3 NOT DETECTED NOT DETECTED Final   Parainfluenza Virus 4 NOT DETECTED NOT DETECTED Final   Respiratory Syncytial Virus NOT DETECTED NOT DETECTED Final   Bordetella pertussis NOT DETECTED NOT DETECTED Final   Chlamydophila pneumoniae NOT DETECTED NOT DETECTED Final   Mycoplasma pneumoniae NOT DETECTED NOT DETECTED Final    Comment: Performed at [facility], [address] [place], [place] [address]    RADIOLOGY:  Dg Chest Portable 1 View  Result Date: 09/05/2019 CLINICAL DATA:  COVID-19 positive. Encephalopathy. EXAM: PORTABLE CHEST 1 VIEW COMPARISON:  08/25/2019 FINDINGS: Cardiomegaly and findings of prior median sternotomy and CABG. No pulmonary edema or focal airspace consolidation. No pleural effusion or pneumothorax. IMPRESSION: 1. No acute cardiopulmonary disease. 2. Cardiomegaly without pulmonary edema. Electronically Signed   By: Deatra Robinson M.D.   On: 09/05/2019 19:03     CODE STATUS:     Code Status Orders  (From admission, onward)         Start     Ordered   09/05/19 2014  Do not attempt resuscitation (DNR)  Continuous    Question Answer Comment  In the event of cardiac or respiratory ARREST Do not call a "code blue"   In the event of cardiac or respiratory ARREST Do not perform Intubation, CPR, defibrillation or ACLS   In the event of cardiac or respiratory ARREST Use medication by any route, position, wound care, and other measures to relive pain and suffering. May use oxygen, suction and manual treatment of airway obstruction as needed for comfort.      09/05/19 2015        Code Status History    Date Active Date Inactive Code Status Order ID Comments User Context   09/05/2019 1821 09/05/2019 2015 DNR [id number]  Willy Eddy, MD ED   09/10/2018 0220 09/15/2018 2020 DNR [id number]  Oralia Manis, MD Inpatient   Advance Care Planning Activity    Advance Directive Documentation     Most Recent Value   Type of Advance Directive  Out of facility DNR (pink MOST or yellow form)  Pre-existing out of facility DNR order (yellow form or pink MOST form)  Pink MOST form placed in chart (order not valid for inpatient use)  "MOST" Form in Place?  -      TOTAL TIME TAKING CARE OF THIS PATIENT: 40 minutes.    Enedina Finner M.D on 09/07/2019 at 12:56 PM  Between 7am to 6pm - Pager - [phone] After 6pm go to [email or web address] - Social research officer, government  [facility]  Office  [phone]  CC: Primary care physician; Dorothey Baseman, MD

## 2019-09-07 NOTE — Social Work (Signed)
CSW confirmed with Army Melia from [facility] that patient would be able to return with hospice care. CSW notified Dr. Posey Pronto, and patient's nurse.      [place], [facility]  [phone]

## 2019-09-07 NOTE — Social Work (Signed)
Army Melia from [facility] shared that hospice staff would not be able to come into the facility today due to current COVID outbreak.

## 2019-09-07 NOTE — ED Notes (Signed)
Patient is resting comfortably. 

## 2019-09-07 NOTE — ED Notes (Signed)
Pt turned on to other side to prevent worsening of wound on sacrum

## 2019-09-07 NOTE — Progress Notes (Signed)
Patient ID: [name] [name], female   DOB: May 11, 1925, 85 y.o.   MRN: [id number]  I had a long discussion with patient's daughter [name] who is in [place] and another daughter who is in [place] both healthcare power of attorney. Patient overall has a poor prognosis given her comorbidities and recent infection with COVID 19 along with sepsis and left lower lobe pneumonia with worsening renal function causing uremic encephalopathy and poor PO intake along with adult failure to thrive.  Both her daughters understand poor prognosis with patient and are in agreement with patient returning to [facility] with hospice and on comfort care. They both understand patient will be on only comfort care medications. She will not be on any of her routine home meds. Social worker and the emergency room discussed with [facility] and they are in agreement with taking patient on hospice.  Discharge patient wants the daughter [name] [name] who leaves currently locally is able to come see mom in the emergency room.  Patient's nurse [name] [name] has been notified.

## 2019-09-07 NOTE — ED Notes (Signed)
Pt brief changed. Clean and dry at this point. Pt turned onto her right side.

## 2019-09-07 NOTE — ED Notes (Signed)
Pt turned to other side to help sacral wound heal.

## 2019-09-07 NOTE — [place] FL2 (Deleted)
[place] MEDICAID FL2 LEVEL OF CARE SCREENING TOOL     IDENTIFICATION  Patient Name: [name] [name] Birthdate: Mar 03, 1925 Sex: female Admission Date (Current Location): 09/05/2019  [place] and [place] Number:  [address] [id number] [facility] and Address:  [facility], [address], [place], [place] [address]      Provider Number: [id number]  Attending Physician Name and Address:  Enedina Finner, MD  Relative Name and Phone Number:  Valentino Nose Daughter [phone]    Current Level of Care: Hospital Recommended Level of Care: Assisted Living Facility Prior Approval Number:    Date Approved/Denied:   PASRR Number:    Discharge Plan: Other (Comment)(ALF)    Current Diagnoses: Patient Active Problem List   Diagnosis Date Noted  . COVID-19 virus infection 09/05/2019  . HCAP (healthcare-associated pneumonia) 09/10/2018  . Acute renal failure superimposed on chronic kidney disease (HCC) 09/10/2018  . Hypertensive heart disease 05/15/2014  . Candidal dermatitis 05/15/2014  . Generalized weakness 05/15/2014  . Pressure ulcer, stage 2 (HCC) 05/15/2014  . Neuropathic pain 05/15/2014  . Absence of bladder continence 05/15/2014  . Diabetes mellitus type 2 with complications (HCC) 05/14/2014  . Hypothyroidism 05/14/2014  . Gout, chronic 05/14/2014  . Constipation 05/14/2014  . Coronary artery disease   . Chronic diastolic CHF (congestive heart failure) (HCC) 03/04/2013  . Essential hypertension 12/13/2012  . Tachycardia 12/11/2011  . SOB (shortness of breath) 03/12/2011  . S/P CABG (coronary artery bypass graft) 03/12/2011  . PVD (peripheral vascular disease) (HCC) 03/12/2011  . Hyperlipemia 03/12/2011  . Edema 03/12/2011    Orientation RESPIRATION BLADDER Height & Weight     Self  O2 Incontinent Weight: 165 lb 5.5 oz (75 kg) Height:  5\' 4"  (162.6 cm)  BEHAVIORAL SYMPTOMS/MOOD NEUROLOGICAL BOWEL NUTRITION STATUS      Incontinent  Diet  AMBULATORY STATUS COMMUNICATION OF NEEDS Skin   Limited Assist Verbally Normal                       Personal Care Assistance Level of Assistance  Bathing, Feeding, Dressing Bathing Assistance: Maximum assistance Feeding assistance: Maximum assistance Dressing Assistance: Maximum assistance     Functional Limitations Info  Sight, Hearing, Speech Sight Info: Adequate Hearing Info: Adequate Speech Info: Adequate    SPECIAL CARE FACTORS FREQUENCY                       Contractures Contractures Info: Not present    Additional Factors Info  Code Status, Allergies Code Status Info: DNR Allergies Info: Acetaminophen, Codeine           Current Medications (09/07/2019):  This is the current hospital active medication list Current Facility-Administered Medications  Medication Dose Route Frequency Provider Last Rate Last Dose  . 0.45 % sodium chloride infusion   Intravenous Continuous 09/09/2019, MD 75 mL/hr at 09/07/19 0404    . allopurinol (ZYLOPRIM) tablet 50 mg  50 mg Oral 09/09/19, MD   50 mg at 09/07/19 1022  . amLODipine (NORVASC) tablet 5 mg  5 mg Oral Daily 09/09/19, MD   5 mg at 09/07/19 1014  . atorvastatin (LIPITOR) tablet 40 mg  40 mg Oral Daily 09/09/19, MD   40 mg at 09/07/19 1007  . cefTRIAXone (ROCEPHIN) 1 g in sodium chloride 0.9 % 100 mL IVPB  1 g Intravenous Q24H 09/09/19, MD   Stopped at 09/06/19 1827  . feeding supplement (PRO-STAT SUGAR FREE 64)  liquid 30 mL  30 mL Oral BID Fritzi Mandes, MD      . fentaNYL (SUBLIMAZE) injection 25 mcg  25 mcg Intravenous Q2H PRN Merlyn Lot, MD   25 mcg at 09/06/19 1814  . gabapentin (NEURONTIN) capsule 300 mg  300 mg Oral BID Fritzi Mandes, MD   300 mg at 09/07/19 1007  . guaiFENesin-dextromethorphan (ROBITUSSIN DM) 100-10 MG/5ML syrup 5 mL  5 mL Oral Q6H PRN Fritzi Mandes, MD   5 mL at 09/07/19 0041  . heparin injection 5,000 Units  5,000 Units Subcutaneous Q8H Fritzi Mandes, MD   5,000  Units at 09/07/19 1017  . insulin aspart (novoLOG) injection 0-5 Units  0-5 Units Subcutaneous QHS Fritzi Mandes, MD   4 Units at 09/07/19 0040  . insulin aspart (novoLOG) injection 0-9 Units  0-9 Units Subcutaneous TID WC Fritzi Mandes, MD   5 Units at 09/07/19 1035  . isosorbide mononitrate (IMDUR) 24 hr tablet 30 mg  30 mg Oral Daily Fritzi Mandes, MD   30 mg at 09/07/19 1008  . [START ON 09/08/2019] levothyroxine (SYNTHROID) tablet 100 mcg  100 mcg Oral QAC breakfast Fritzi Mandes, MD      . loratadine (CLARITIN) tablet 10 mg  10 mg Oral Daily Fritzi Mandes, MD   10 mg at 09/07/19 1007  . LORazepam (ATIVAN) 2 MG/ML concentrated solution 0.5 mg  0.5 mg Oral Q6H PRN Fritzi Mandes, MD      . methylPREDNISolone sodium succinate (SOLU-MEDROL) 40 mg/mL injection 40 mg  40 mg Intravenous Q12H Lang Snow, NP   40 mg at 09/07/19 1041  . metoprolol tartrate (LOPRESSOR) tablet 50 mg  50 mg Oral BID Fritzi Mandes, MD   50 mg at 09/07/19 1050  . morphine CONCENTRATE 10 MG/0.5ML oral solution 5 mg  5 mg Oral Q2H PRN Fritzi Mandes, MD      . multivitamin with minerals tablet 1 tablet  1 tablet Oral Daily Fritzi Mandes, MD   1 tablet at 09/06/19 1747  . ondansetron (ZOFRAN) tablet 4 mg  4 mg Oral QID PRN Fritzi Mandes, MD      . polyethylene glycol (MIRALAX / GLYCOLAX) packet 17 g  17 g Oral q1800 Fritzi Mandes, MD      . senna-docusate (Senokot-S) tablet 1 tablet  1 tablet Oral BID Fritzi Mandes, MD   1 tablet at 09/07/19 1050  . vitamin B-12 (CYANOCOBALAMIN) tablet 1,000 mcg  1,000 mcg Oral Daily Fritzi Mandes, MD   1,000 mcg at 09/07/19 1046   Current Outpatient Medications  Medication Sig Dispense Refill  . LORazepam (ATIVAN) 2 MG/ML concentrated solution Take 0.3 mLs (0.6 mg total) by mouth every 6 (six) hours as needed for anxiety. 30 mL 0  . Morphine Sulfate (MORPHINE CONCENTRATE) 10 MG/0.5ML SOLN concentrated solution Take 0.25 mLs (5 mg total) by mouth every 2 (two) hours as needed for severe pain or anxiety. 180  mL 0     Discharge Medications: Please see discharge summary for a list of discharge medications.  Relevant Imaging Results:  Relevant Lab Results:   Additional Information SSN: [id number]  Tania Aasiya Creasey, LCSW

## 2019-09-07 NOTE — ED Notes (Signed)
EMS at bedside to take pt back to facility

## 2019-09-07 NOTE — Discharge Instructions (Signed)
[facility]

## 2019-09-13 DIAGNOSIS — Z8719 Personal history of other diseases of the digestive system: Secondary | ICD-10-CM | POA: Diagnosis not present

## 2019-09-13 DIAGNOSIS — E119 Type 2 diabetes mellitus without complications: Secondary | ICD-10-CM | POA: Diagnosis not present

## 2019-09-13 DIAGNOSIS — I252 Old myocardial infarction: Secondary | ICD-10-CM | POA: Diagnosis not present

## 2019-09-13 DIAGNOSIS — N189 Chronic kidney disease, unspecified: Secondary | ICD-10-CM | POA: Diagnosis not present

## 2019-09-13 DIAGNOSIS — J189 Pneumonia, unspecified organism: Secondary | ICD-10-CM | POA: Diagnosis not present

## 2019-09-13 DIAGNOSIS — G9340 Encephalopathy, unspecified: Secondary | ICD-10-CM | POA: Diagnosis not present

## 2019-09-13 DIAGNOSIS — D649 Anemia, unspecified: Secondary | ICD-10-CM | POA: Diagnosis not present

## 2019-09-13 DIAGNOSIS — I10 Essential (primary) hypertension: Secondary | ICD-10-CM | POA: Diagnosis not present

## 2019-09-13 DIAGNOSIS — E43 Unspecified severe protein-calorie malnutrition: Secondary | ICD-10-CM | POA: Diagnosis not present

## 2019-09-13 DIAGNOSIS — M1000 Idiopathic gout, unspecified site: Secondary | ICD-10-CM | POA: Diagnosis not present

## 2019-09-13 DIAGNOSIS — N179 Acute kidney failure, unspecified: Secondary | ICD-10-CM | POA: Diagnosis not present

## 2019-09-13 DIAGNOSIS — U071 COVID-19: Secondary | ICD-10-CM | POA: Diagnosis not present

## 2019-09-13 DIAGNOSIS — R627 Adult failure to thrive: Secondary | ICD-10-CM | POA: Diagnosis not present

## 2019-10-11 DEATH — deceased
